# Patient Record
Sex: Male | Born: 1950
Health system: Southern US, Community
[De-identification: ages and names within clinical notes are randomized; demographics above are authoritative.]

## PROBLEM LIST (undated history)

## (undated) ENCOUNTER — Emergency Department (HOSPITAL_COMMUNITY): Admission: EM | Payer: Self-pay | Source: Home / Self Care

## (undated) DIAGNOSIS — T7840XA Allergy, unspecified, initial encounter: Secondary | ICD-10-CM

## (undated) DIAGNOSIS — F22 Delusional disorders: Secondary | ICD-10-CM

## (undated) DIAGNOSIS — N189 Chronic kidney disease, unspecified: Secondary | ICD-10-CM

## (undated) DIAGNOSIS — E1165 Type 2 diabetes mellitus with hyperglycemia: Secondary | ICD-10-CM

## (undated) HISTORY — DX: Delusional disorders: F22

## (undated) HISTORY — PX: KNEE SURGERY: SHX244

## (undated) HISTORY — PX: TONSILLECTOMY: SUR1361

## (undated) HISTORY — DX: Type 2 diabetes mellitus with hyperglycemia: E11.65

## (undated) HISTORY — DX: Allergy, unspecified, initial encounter: T78.40XA

## (undated) HISTORY — DX: Chronic kidney disease, unspecified: N18.9

---

## 1999-02-24 ENCOUNTER — Emergency Department (HOSPITAL_COMMUNITY): Admission: EM | Admit: 1999-02-24 | Discharge: 1999-02-25 | Payer: Self-pay | Admitting: Emergency Medicine

## 2005-06-22 ENCOUNTER — Ambulatory Visit: Payer: Self-pay | Admitting: Internal Medicine

## 2006-07-30 ENCOUNTER — Encounter: Admission: RE | Admit: 2006-07-30 | Discharge: 2006-07-30 | Payer: Self-pay | Admitting: Orthopedic Surgery

## 2011-08-20 LAB — HM COLONOSCOPY

## 2013-02-03 ENCOUNTER — Ambulatory Visit: Payer: Medicare Other

## 2013-02-03 ENCOUNTER — Ambulatory Visit (INDEPENDENT_AMBULATORY_CARE_PROVIDER_SITE_OTHER): Payer: Medicare Other | Admitting: Emergency Medicine

## 2013-02-03 VITALS — BP 125/84 | HR 82 | Temp 98.0°F | Resp 16 | Ht 73.0 in | Wt 284.0 lb

## 2013-02-03 DIAGNOSIS — M25562 Pain in left knee: Secondary | ICD-10-CM

## 2013-02-03 DIAGNOSIS — M109 Gout, unspecified: Secondary | ICD-10-CM

## 2013-02-03 DIAGNOSIS — M729 Fibroblastic disorder, unspecified: Secondary | ICD-10-CM

## 2013-02-03 LAB — POCT CBC
HCT, POC: 40.1 % — AB (ref 43.5–53.7)
MCH, POC: 30.3 pg (ref 27–31.2)
MCHC: 31.4 g/dL — AB (ref 31.8–35.4)
MPV: 7.6 fL (ref 0–99.8)
POC MID %: 10.3 %M (ref 0–12)
RBC: 4.16 M/uL — AB (ref 4.69–6.13)
WBC: 7 10*3/uL (ref 4.6–10.2)

## 2013-02-03 MED ORDER — COLCHICINE 0.6 MG PO TABS: ORAL_TABLET | ORAL | Status: DC

## 2013-02-03 MED ORDER — INDOMETHACIN 25 MG PO CAPS: 25.0000 mg | ORAL_CAPSULE | Freq: Two times a day (BID) | ORAL | Status: DC

## 2013-02-03 NOTE — Patient Instructions (Addendum)
Gout Gout Gout is an inflammatory condition (arthritis) caused by a buildup of uric acid crystals in the joints. Uric acid is a chemical that is normally present in the blood. Under some circumstances, uric acid can form into crystals in your joints. This causes joint redness, soreness, and swelling (inflammation). Repeat attacks are common. Over time, uric acid crystals can form into masses (tophi) near a joint, causing disfigurement. Gout is treatable and often preventable. CAUSES  The disease begins with elevated levels of uric acid in the blood. Uric acid is produced by your body when it breaks down a naturally found substance called purines. This also happens when you eat certain foods such as meats and fish. Causes of an elevated uric acid level include:  Being passed down from parent to child (heredity).  Diseases that cause increased uric acid production (obesity, psoriasis, some cancers).  Excessive alcohol use.  Diet, especially diets rich in meat and seafood.  Medicines, including certain cancer-fighting drugs (chemotherapy), diuretics, and aspirin.  Chronic kidney disease. The kidneys are no longer able to remove uric acid well.  Problems with metabolism. Conditions strongly associated with gout include:  Obesity.  High blood pressure.  High cholesterol.  Diabetes. Not everyone with elevated uric acid levels gets gout. It is not understood why some people get gout and others do not. Surgery, joint injury, and eating too much of certain foods are some of the factors that can lead to gout. SYMPTOMS   An attack of gout comes on quickly. It causes intense pain with redness, swelling, and warmth in a joint.  Fever can occur.  Often, only one joint is involved. Certain joints are more commonly involved:  Base of the big toe.  Knee.  Ankle.  Wrist.  Finger. Without treatment, an attack usually goes away in a few days to weeks. Between attacks, you usually will not  have symptoms, which is different from many other forms of arthritis. DIAGNOSIS  Your caregiver will suspect gout based on your symptoms and exam. Removal of fluid from the joint (arthrocentesis) is done to check for uric acid crystals. Your caregiver will give you a medicine that numbs the area (local anesthetic) and use a needle to remove joint fluid for exam. Gout is confirmed when uric acid crystals are seen in joint fluid, using a special microscope. Sometimes, blood, urine, and X-ray tests are also used. TREATMENT  There are 2 phases to gout treatment: treating the sudden onset (acute) attack and preventing attacks (prophylaxis). Treatment of an Acute Attack  Medicines are used. These include anti-inflammatory medicines or steroid medicines.  An injection of steroid medicine into the affected joint is sometimes necessary.  The painful joint is rested. Movement can worsen the arthritis.  You may use warm or cold treatments on painful joints, depending which works best for you.  Discuss the use of coffee, vitamin C, or cherries with your caregiver. These may be helpful treatment options. Treatment to Prevent Attacks After the acute attack subsides, your caregiver may advise prophylactic medicine. These medicines either help your kidneys eliminate uric acid from your body or decrease your uric acid production. You may need to stay on these medicines for a very long time. The early phase of treatment with prophylactic medicine can be associated with an increase in acute gout attacks. For this reason, during the first few months of treatment, your caregiver may also advise you to take medicines usually used for acute gout treatment. Be sure you understand your caregiver's  directions. You should also discuss dietary treatment with your caregiver. Certain foods such as meats and fish can increase uric acid levels. Other foods such as dairy can decrease levels. Your caregiver can give you a list of  foods to avoid. HOME CARE INSTRUCTIONS   Do not take aspirin to relieve pain. This raises uric acid levels.  Only take over-the-counter or prescription medicines for pain, discomfort, or fever as directed by your caregiver.  Rest the joint as much as possible. When in bed, keep sheets and blankets off painful areas.  Keep the affected joint raised (elevated).  Use crutches if the painful joint is in your leg.  Drink enough water and fluids to keep your urine clear or pale yellow. This helps your body get rid of uric acid. Do not drink alcoholic beverages. They slow the passage of uric acid.  Follow your caregiver's dietary instructions. Pay careful attention to the amount of protein you eat. Your daily diet should emphasize fruits, vegetables, whole grains, and fat-free or low-fat milk products.  Maintain a healthy body weight. SEEK MEDICAL CARE IF:   You have an oral temperature above 102 F (38.9 C).  You develop diarrhea, vomiting, or any side effects from medicines.  You do not feel better in 24 hours, or you are getting worse. SEEK IMMEDIATE MEDICAL CARE IF:   Your joint becomes suddenly more tender and you have:  Chills.  An oral temperature above 102 F (38.9 C), not controlled by medicine. MAKE SURE YOU:   Understand these instructions.  Will watch your condition.  Will get help right away if you are not doing well or get worse. Document Released: 08/02/2000 Document Revised: 10/28/2011 Document Reviewed: 11/13/2009 [facility] Patient Information 2014 [place], [place].

## 2013-02-03 NOTE — Progress Notes (Signed)
[facility] [address], [place] [address] [phone] [phone]  Date:  02/03/2013   Name:  [name]   DOB:  August 29, 1950   MRN:  [id number]  PCP:  Lorenda Peck, MD    Chief Complaint: Knee Injury   History of Present Illness:  [name] is a 62 y.o. very pleasant male patient who presents with the following:  Moving a television and injured his knee is his belief.  No definite injury by history.  Has marked swelling and pain, redness and guarding.  Won't bend his knee due to pain.  No improvement with over the counter medications or other home remedies. Denies other complaint or health concern today.   There are no active problems to display for this patient.   Past Medical History  Diagnosis Date  . Allergy   . Chronic kidney disease     Past Surgical History  Procedure Laterality Date  . Tonsillectomy Bilateral   . Knee surgery Left     History  Substance Use Topics  . Smoking status: Never Smoker   . Smokeless tobacco: Not on file  . Alcohol Use: No    History reviewed. No pertinent family history.  Allergies  Allergen Reactions  . Achromycin (Tetracycline) Rash    Medication list has been reviewed and updated.  No current outpatient prescriptions on file prior to visit.   No current facility-administered medications on file prior to visit.    Review of Systems:  As per HPI, otherwise negative.    Physical Examination: Filed Vitals:   02/03/13 1757  BP: 125/84  Pulse: 82  Temp: 98 F (36.7 C)  Resp: 16   Filed Vitals:   02/03/13 1757  Height: 6\' 1"  (1.854 m)  Weight: 284 lb (128.822 kg)   Body mass index is 37.48 kg/(m^2). Ideal Body Weight: Weight in (lb) to have BMI = 25: 189.1   GEN: WDWN, NAD, Non-toxic, Alert & Oriented x 3 HEENT: Atraumatic, Normocephalic.  Ears and Nose: No external deformity. EXTR: No clubbing/cyanosis/edema NEURO: Normal gait.  PSYCH: Normally interactive. Conversant.  Not depressed or anxious appearing.  Calm demeanor.  LEFT Knee:  Moderate effusion.  Limited flexion.  Hot.    Assessment and Plan: Gout Labs pending Indocin colcrys Crutches WBAT  Signed,  Phillips Odor, MD   [facility] reading (PRIMARY) by  Dr. Dareen Piano.  Staple, degenerative arthritis .  Results for orders placed in visit on 02/03/13  POCT CBC      Result Value Range   WBC 7.0  4.6 - 10.2 K/uL   Lymph, poc 1.7  0.6 - 3.4   POC LYMPH PERCENT 23.8  10 - 50 %L   MID (cbc) 0.7  0 - 0.9   POC MID % 10.3  0 - 12 %M   POC Granulocyte 4.6  2 - 6.9   Granulocyte percent 65.9  37 - 80 %G   RBC 4.16 (*) 4.69 - 6.13 M/uL   Hemoglobin 12.6 (*) 14.1 - 18.1 g/dL   HCT, POC 29.5 (*) 62.1 - 53.7 %   MCV 96.5  80 - 97 fL   MCH, POC 30.3  27 - 31.2 pg   MCHC 31.4 (*) 31.8 - 35.4 g/dL   RDW, POC 30.8     Platelet Count, POC 218  142 - 424 K/uL   MPV 7.6  0 - 99.8 fL

## 2014-08-30 DIAGNOSIS — C44319 Basal cell carcinoma of skin of other parts of face: Secondary | ICD-10-CM | POA: Diagnosis not present

## 2014-10-24 DIAGNOSIS — E1165 Type 2 diabetes mellitus with hyperglycemia: Secondary | ICD-10-CM | POA: Diagnosis not present

## 2014-10-25 DIAGNOSIS — E1165 Type 2 diabetes mellitus with hyperglycemia: Secondary | ICD-10-CM | POA: Diagnosis not present

## 2014-12-27 DIAGNOSIS — L570 Actinic keratosis: Secondary | ICD-10-CM | POA: Diagnosis not present

## 2014-12-27 DIAGNOSIS — Z85828 Personal history of other malignant neoplasm of skin: Secondary | ICD-10-CM | POA: Diagnosis not present

## 2014-12-27 DIAGNOSIS — L919 Hypertrophic disorder of the skin, unspecified: Secondary | ICD-10-CM | POA: Diagnosis not present

## 2014-12-27 DIAGNOSIS — L814 Other melanin hyperpigmentation: Secondary | ICD-10-CM | POA: Diagnosis not present

## 2015-01-23 DIAGNOSIS — H9113 Presbycusis, bilateral: Secondary | ICD-10-CM | POA: Diagnosis not present

## 2015-01-23 DIAGNOSIS — R002 Palpitations: Secondary | ICD-10-CM | POA: Diagnosis not present

## 2015-01-23 DIAGNOSIS — Z1211 Encounter for screening for malignant neoplasm of colon: Secondary | ICD-10-CM | POA: Diagnosis not present

## 2015-01-23 DIAGNOSIS — R1032 Left lower quadrant pain: Secondary | ICD-10-CM | POA: Diagnosis not present

## 2015-01-23 DIAGNOSIS — E1165 Type 2 diabetes mellitus with hyperglycemia: Secondary | ICD-10-CM | POA: Diagnosis not present

## 2015-01-23 DIAGNOSIS — Z0000 Encounter for general adult medical examination without abnormal findings: Secondary | ICD-10-CM | POA: Diagnosis not present

## 2015-01-23 DIAGNOSIS — Z6836 Body mass index (BMI) 36.0-36.9, adult: Secondary | ICD-10-CM | POA: Diagnosis not present

## 2015-01-23 DIAGNOSIS — E784 Other hyperlipidemia: Secondary | ICD-10-CM | POA: Diagnosis not present

## 2015-01-23 DIAGNOSIS — I70219 Atherosclerosis of native arteries of extremities with intermittent claudication, unspecified extremity: Secondary | ICD-10-CM | POA: Diagnosis not present

## 2015-01-23 DIAGNOSIS — R42 Dizziness and giddiness: Secondary | ICD-10-CM | POA: Diagnosis not present

## 2015-01-23 DIAGNOSIS — Z1389 Encounter for screening for other disorder: Secondary | ICD-10-CM | POA: Diagnosis not present

## 2015-01-27 DIAGNOSIS — Z79899 Other long term (current) drug therapy: Secondary | ICD-10-CM | POA: Diagnosis not present

## 2015-01-27 DIAGNOSIS — E1165 Type 2 diabetes mellitus with hyperglycemia: Secondary | ICD-10-CM | POA: Diagnosis not present

## 2015-01-27 DIAGNOSIS — R5383 Other fatigue: Secondary | ICD-10-CM | POA: Diagnosis not present

## 2015-01-27 DIAGNOSIS — Z125 Encounter for screening for malignant neoplasm of prostate: Secondary | ICD-10-CM | POA: Diagnosis not present

## 2015-01-27 DIAGNOSIS — E039 Hypothyroidism, unspecified: Secondary | ICD-10-CM | POA: Diagnosis not present

## 2015-01-27 DIAGNOSIS — E780 Pure hypercholesterolemia: Secondary | ICD-10-CM | POA: Diagnosis not present

## 2015-06-01 DIAGNOSIS — Z23 Encounter for immunization: Secondary | ICD-10-CM | POA: Diagnosis not present

## 2015-07-03 DIAGNOSIS — Z85828 Personal history of other malignant neoplasm of skin: Secondary | ICD-10-CM | POA: Diagnosis not present

## 2015-07-03 DIAGNOSIS — L814 Other melanin hyperpigmentation: Secondary | ICD-10-CM | POA: Diagnosis not present

## 2015-07-03 DIAGNOSIS — L820 Inflamed seborrheic keratosis: Secondary | ICD-10-CM | POA: Diagnosis not present

## 2015-07-03 DIAGNOSIS — L851 Acquired keratosis [keratoderma] palmaris et plantaris: Secondary | ICD-10-CM | POA: Diagnosis not present

## 2016-05-22 DIAGNOSIS — Z23 Encounter for immunization: Secondary | ICD-10-CM | POA: Diagnosis not present

## 2017-02-04 ENCOUNTER — Encounter: Payer: Self-pay | Admitting: Behavioral Health

## 2017-02-04 ENCOUNTER — Telehealth: Payer: Self-pay | Admitting: Behavioral Health

## 2017-02-04 NOTE — Addendum Note (Signed)
Addended by: Eduard Roux E on: 02/04/2017 12:23 PM   Modules accepted: Orders

## 2017-02-04 NOTE — Telephone Encounter (Signed)
Pre-Visit Call completed with patient and chart updated.   Pre-Visit Info documented in Specialty Comments under [employer].    

## 2017-02-04 NOTE — Telephone Encounter (Signed)
Unable to reach patient at time of Pre-Visit Call.  Left message for patient to return call when available.    

## 2017-02-05 ENCOUNTER — Ambulatory Visit (HOSPITAL_BASED_OUTPATIENT_CLINIC_OR_DEPARTMENT_OTHER)
Admission: RE | Admit: 2017-02-05 | Discharge: 2017-02-05 | Disposition: A | Discharge status: Home / Self Care | Payer: Medicare Other | Source: Ambulatory Visit | Attending: Family Medicine | Admitting: Family Medicine

## 2017-02-05 ENCOUNTER — Ambulatory Visit (INDEPENDENT_AMBULATORY_CARE_PROVIDER_SITE_OTHER): Payer: Medicare Other | Admitting: Family Medicine

## 2017-02-05 ENCOUNTER — Encounter: Payer: Self-pay | Admitting: Family Medicine

## 2017-02-05 VITALS — BP 118/72 | HR 91 | Temp 98.3°F | Ht 73.0 in | Wt 266.5 lb

## 2017-02-05 DIAGNOSIS — G8929 Other chronic pain: Secondary | ICD-10-CM | POA: Insufficient documentation

## 2017-02-05 DIAGNOSIS — M25562 Pain in left knee: Secondary | ICD-10-CM

## 2017-02-05 DIAGNOSIS — M25462 Effusion, left knee: Secondary | ICD-10-CM | POA: Insufficient documentation

## 2017-02-05 DIAGNOSIS — M2342 Loose body in knee, left knee: Secondary | ICD-10-CM | POA: Diagnosis not present
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
## 2017-02-05 NOTE — Progress Notes (Signed)
Pre visit review using our clinic review tool, if applicable. No additional management support is needed unless otherwise documented below in the visit note. 

## 2017-02-05 NOTE — Patient Instructions (Signed)
We will reach out to you if your X-ray is abnormal.   Come fasting to your next appt for 9-12 hours.

## 2017-02-12 ENCOUNTER — Telehealth: Payer: Self-pay | Admitting: Family Medicine

## 2017-02-12 NOTE — Telephone Encounter (Signed)
Pt brought his medical records from his previous primary care physician. Placed chart with supervisor for review.

## 2017-02-13 ENCOUNTER — Telehealth: Payer: Self-pay | Admitting: *Deleted

## 2017-02-13 ENCOUNTER — Telehealth: Payer: Self-pay | Admitting: Family Medicine

## 2017-02-13 NOTE — Telephone Encounter (Signed)
Received Medical records from Dr. Lorene Dy, MD, forwarded to provider/SLS 06/28

## 2017-02-13 NOTE — Telephone Encounter (Signed)
Record review- Synthroid 50 mcg daily Metformin 500 mg daily Amaryl 2 mg daily ASA 81 mg daily Appears he was discharged from previous office, noncompliance was an issue. Last colonoscopy in 04/23/12- diverticulosis, normal otherwise, recommended 5 years (not 10?) follow up for screening purposes.  Last eye exam was 07/13/13, normal. Hx of basal cell carcinoma.

## 2017-03-06 ENCOUNTER — Encounter: Payer: Self-pay | Admitting: Family Medicine

## 2017-03-06 ENCOUNTER — Ambulatory Visit (INDEPENDENT_AMBULATORY_CARE_PROVIDER_SITE_OTHER): Payer: Medicare Other | Admitting: Family Medicine

## 2017-03-06 VITALS — BP 112/78 | HR 74 | Wt 264.8 lb

## 2017-03-06 DIAGNOSIS — Z0000 Encounter for general adult medical examination without abnormal findings: Secondary | ICD-10-CM

## 2017-03-06 DIAGNOSIS — Z8639 Personal history of other endocrine, nutritional and metabolic disease: Secondary | ICD-10-CM

## 2017-03-06 DIAGNOSIS — Z114 Encounter for screening for human immunodeficiency virus [HIV]: Secondary | ICD-10-CM | POA: Diagnosis not present

## 2017-03-06 DIAGNOSIS — Z1159 Encounter for screening for other viral diseases: Secondary | ICD-10-CM

## 2017-03-06 DIAGNOSIS — E785 Hyperlipidemia, unspecified: Secondary | ICD-10-CM | POA: Diagnosis not present

## 2017-03-06 DIAGNOSIS — E1169 Type 2 diabetes mellitus with other specified complication: Secondary | ICD-10-CM

## 2017-03-06 LAB — COMPREHENSIVE METABOLIC PANEL
ALT: 43 U/L (ref 0–53)
AST: 32 U/L (ref 0–37)
Albumin: 4.2 g/dL (ref 3.5–5.2)
Alkaline Phosphatase: 56 U/L (ref 39–117)
BILIRUBIN TOTAL: 0.8 mg/dL (ref 0.2–1.2)
BUN: 11 mg/dL (ref 6–23)
CHLORIDE: 101 meq/L (ref 96–112)
CO2: 29 meq/L (ref 19–32)
Calcium: 9.4 mg/dL (ref 8.4–10.5)
Creatinine, Ser: 0.79 mg/dL (ref 0.40–1.50)
GFR: 104.98 mL/min (ref >60.00)
GLUCOSE: 163 mg/dL — AB (ref 70–99)
Potassium: 4.2 mEq/L (ref 3.5–5.1)
Sodium: 136 mEq/L (ref 135–145)
Total Protein: 6.8 g/dL (ref 6.0–8.3)

## 2017-03-06 LAB — HEPATITIS C ANTIBODY: HCV Ab: NEGATIVE

## 2017-03-06 LAB — LIPID PANEL
CHOL/HDL RATIO: 3
Cholesterol: 133 mg/dL (ref 0–200)
HDL: 39.5 mg/dL (ref >39.00)
LDL CALC: 74 mg/dL (ref 0–99)
NONHDL: 93.68
Triglycerides: 99 mg/dL (ref 0.0–149.0)
VLDL: 19.8 mg/dL (ref 0.0–40.0)

## 2017-03-06 LAB — HEMOGLOBIN A1C: HEMOGLOBIN A1C: 11.4 % — AB (ref 4.6–6.5)

## 2017-03-06 NOTE — Progress Notes (Addendum)
Chief Complaint  Patient presents with  . Annual Exam    Subjective: Pt here for initial Welcome to Medicare Evaluation.  Past Medical History:  Diagnosis Date  . Allergy   . Chronic kidney disease   . Delusional disorder ([place])    Family History  Problem Relation Age of Onset  . Cancer Neg Hx    Past Surgical History:  Procedure Laterality Date  . KNEE SURGERY Left   . TONSILLECTOMY Bilateral    Current Outpatient Prescriptions on File Prior to Visit  Medication Sig Dispense Refill  . Multiple Vitamin (MULTIVITAMIN) tablet Take 1 tablet by mouth daily.    . Oral Electrolytes (BUFFERED SALT PO) Take by mouth.     Allergies  Allergen Reactions  . Achromycin [Tetracycline] Rash     Mental Health/Substance abuse evaluation: Yes  PHQ-2:  Feelings of depression? No  Loss of satisfaction/pleasure in doing things? No  Cognitive evaluation? Yes- alert and oriented  Fall Risk: Less than 2 falls within the past 12 months? Yes  Mindful of grabbing bars in bathroom, ruffles in rugs, poorly lit areas, handrails on the stairs? Yes   Discussion of functional ability done: Encouraged to maintain physical activity and flexibility. Live alone? No - Lives with sister and Jocelyn Lamer- sister in law Need help with the following? Bathing: No  Managing money: No  Taking medications: No  Telephone use: No  Transportation: No  Shopping: No  Preparing meals: No   Hearing/Vision screen: Trouble hearing TV or radio when others do not? No  Straining or struggling to hear/understand conversation? No   Fire safety: Have a working smoke alarm? Yes   Diet Balanced diet? Yes  3 meals daily? Yes  Assistance needed? No   BP 112/78 (BP Location: Left Arm, Patient Position: Sitting, Cuff Size: Large)   Pulse 74   Wt 264 lb 12.8 oz (120.1 kg)   SpO2 95%   BMI 34.94 kg/m   End of life care planning/counseling: Does patient wish to discuss end of life care/planning? No  Does the patient  have an advanced directive? Yes  Patient code status/living will: DNR Forms given? No   Assessment and Plan Well adult exam  Screening for HIV (human immunodeficiency virus)  Need for hepatitis C screening test  History of diabetes mellitus   PCP- Shelda Pal No other specialists or care providers noted.  Colonoscopy- Written plan in AVS for preventative health services. Next colonoscopy 2023.  Tetanus due 08/2023 Influenza vaccine yearly Immunizations UTD- will suggest PCV23 at next visit pending his A1c. F/u pending above. The patient voiced understanding and agreement to the plan.  [place], DO 03/06/17 1:45 PM

## 2017-03-06 NOTE — Patient Instructions (Addendum)
Call your insurance company and ask about coverage for the Shingrix vaccine (new shingles vaccination).  Your next colonoscopy is due in September, 2023.  Follow up will be based on the results of your lab work.   Let us know if you need anything.

## 2017-03-07 ENCOUNTER — Other Ambulatory Visit: Payer: Self-pay | Admitting: Family Medicine

## 2017-03-07 ENCOUNTER — Telehealth: Payer: Self-pay | Admitting: *Deleted

## 2017-03-07 LAB — HIV ANTIBODY (ROUTINE TESTING W REFLEX): HIV 1&2 Ab, 4th Generation: NONREACTIVE

## 2017-03-07 MED ORDER — METFORMIN HCL 500 MG PO TABS: ORAL_TABLET | ORAL | 1 refills | Status: DC

## 2017-03-07 NOTE — Progress Notes (Unsigned)
Metformin sent in.  

## 2017-03-07 NOTE — Telephone Encounter (Signed)
Called and spoke with the pt and the pt's sister and informed them of the pt's recent lab results and note.  They both verbalized understanding and agreed.    Pt was scheduled a follow-up appt for (Wed-03/26/17@ 2:00pm)  New prescription sent to the pharmacy.//AB/CMA

## 2017-03-07 NOTE — Telephone Encounter (Signed)
Rx denied.  New rx sent to the pharmacy.//AB/CMA

## 2017-03-07 NOTE — Telephone Encounter (Signed)
-----   Message from Shelda Pal, DO sent at 03/07/2017  7:16 AM EDT ----- Definitely has diabetes, will restart Metformin and have him follow up in 2-3 weeks. Will rec statin at next visit also.   AB- let pt know his diabetes is not well controlled, I have called in Metformin for him to take, gradual increase should be detailed on bottle. I would like to see him in 2-3 weeks for a visit to discuss next steps with his diabetes. He does not need to be fasting. TY.

## 2017-03-10 NOTE — Telephone Encounter (Signed)
Rx denied.  Pt has already picked up the prescription.//AB/CMA

## 2017-03-26 ENCOUNTER — Ambulatory Visit (INDEPENDENT_AMBULATORY_CARE_PROVIDER_SITE_OTHER): Payer: Medicare Other | Admitting: Family Medicine

## 2017-03-26 ENCOUNTER — Encounter: Payer: Self-pay | Admitting: Family Medicine

## 2017-03-26 ENCOUNTER — Other Ambulatory Visit: Payer: Self-pay | Admitting: Family Medicine

## 2017-03-26 VITALS — BP 151/87 | HR 84 | Ht 73.0 in | Wt 266.8 lb

## 2017-03-26 DIAGNOSIS — IMO0002 Reserved for concepts with insufficient information to code with codable children: Secondary | ICD-10-CM

## 2017-03-26 DIAGNOSIS — E1165 Type 2 diabetes mellitus with hyperglycemia: Secondary | ICD-10-CM

## 2017-03-26 DIAGNOSIS — E1142 Type 2 diabetes mellitus with diabetic polyneuropathy: Secondary | ICD-10-CM | POA: Insufficient documentation

## 2017-03-26 DIAGNOSIS — IMO0001 Reserved for inherently not codable concepts without codable children: Secondary | ICD-10-CM

## 2017-03-26 HISTORY — DX: Type 2 diabetes mellitus with hyperglycemia: E11.65

## 2017-03-26 HISTORY — DX: Reserved for concepts with insufficient information to code with codable children: IMO0002

## 2017-03-26 MED ORDER — PIOGLITAZONE HCL 30 MG PO TABS: 30.0000 mg | ORAL_TABLET | Freq: Every day | ORAL | 1 refills | Status: DC

## 2017-03-26 NOTE — Patient Instructions (Addendum)
Take Metformin twice daily.  Ideally 2 tabs twice daily. This should not affect your liver.  We are calling in another medication to take with your diabetes. Check you sugars and write them down. Bring them to your next appointment.   Aim to do some physical exertion for 150 minutes per week. This is typically divided into 5 days per week, 30 minutes per day. The activity should be enough to get your heart rate up. Anything is better than nothing if you have time constraints.  Let us know if you need anything.

## 2017-03-26 NOTE — Progress Notes (Signed)
Pre visit review using our clinic review tool, if applicable. No additional management support is needed unless otherwise documented below in the visit note. 

## 2017-03-26 NOTE — Progress Notes (Signed)
Chief Complaint  Patient presents with  . Follow-up    pt. here today for diabetes follow-up; no complaints or concerns at this time    Subjective: Patient is a 66 y.o. male here for DM.  Pt had a hx of DM, formerly on Metformin and Glimepiride. He stopped Metformin due to concern for liver issues and the latter because he did not like the way it makes him feel. His glucometer is out of date and he does not routinely check his sugars. He is not routinely physically active. He eats better since hearing the news around 3 weeks ago.    ROS: Heart: Denies chest pain  Lungs: Denies SOB   Family History  Problem Relation Age of Onset  . Cancer Neg Hx    Past Medical History:  Diagnosis Date  . Allergy   . Chronic kidney disease   . Delusional disorder ([place])   . Diabetes type 2, uncontrolled ([place]) 03/26/2017   Allergies  Allergen Reactions  . Achromycin [Tetracycline] Rash    Current Outpatient Prescriptions:  .  metFORMIN (GLUCOPHAGE) 500 MG tablet, Week 1: 1 tab daily Week 2: 1 tab twice daily Week 3: 2 tabs in AM, 1 in evening Week 4: 2 tabs twice daily, Disp: 120 tablet, Rfl: 1 .  Multiple Vitamin (MULTIVITAMIN) tablet, Take 1 tablet by mouth daily., Disp: , Rfl:  .  Oral Electrolytes (BUFFERED SALT PO), Take by mouth., Disp: , Rfl:  .  pioglitazone (ACTOS) 30 MG tablet, Take 1 tablet (30 mg total) by mouth daily., Disp: 30 tablet, Rfl: 1  Objective: BP (!) 151/87 (BP Location: Left Arm, Cuff Size: Large)   Pulse 84   Ht 6\' 1"  (1.854 m)   Wt 266 lb 12.8 oz (121 kg)   SpO2 98%   BMI 35.20 kg/m  General: Awake, appears stated age HEENT: MMM, EOMi Heart: RRR, no murmurs Lungs: CTAB, no rales, wheezes or rhonchi. No accessory muscle use Psych: Age appropriate judgment and insight, normal affect and mood  Assessment and Plan: Uncontrolled type 2 diabetes mellitus without complication, without long-term current use of insulin ([place]) - Plan: pioglitazone (ACTOS) 30 MG  tablet  Orders as above. Pt has not been taking Metformin as instructed, only taking 1 pill/day. Try to get him to take 1 pill twice daily at least. Add Actos.  Counseled on diet and exercise.  F/u in 6 weeks to recheck both BP and sugars. Will also push statin.   The patient voiced understanding and agreement to the plan.  [place], [place] 03/26/17  6:49 PM

## 2017-03-28 NOTE — Telephone Encounter (Signed)
Rx approved and sent to the pharmacy by e-script.//AB/CMA 

## 2017-04-24 DIAGNOSIS — C44629 Squamous cell carcinoma of skin of left upper limb, including shoulder: Secondary | ICD-10-CM | POA: Diagnosis not present

## 2017-04-24 DIAGNOSIS — D485 Neoplasm of uncertain behavior of skin: Secondary | ICD-10-CM | POA: Diagnosis not present

## 2017-06-03 DIAGNOSIS — C44629 Squamous cell carcinoma of skin of left upper limb, including shoulder: Secondary | ICD-10-CM | POA: Diagnosis not present

## 2017-06-03 DIAGNOSIS — L905 Scar conditions and fibrosis of skin: Secondary | ICD-10-CM | POA: Diagnosis not present

## 2017-06-04 DIAGNOSIS — L905 Scar conditions and fibrosis of skin: Secondary | ICD-10-CM | POA: Diagnosis not present

## 2017-06-11 DIAGNOSIS — Z23 Encounter for immunization: Secondary | ICD-10-CM | POA: Diagnosis not present

## 2017-07-07 DIAGNOSIS — M25562 Pain in left knee: Secondary | ICD-10-CM | POA: Diagnosis not present

## 2017-07-07 DIAGNOSIS — Z9889 Other specified postprocedural states: Secondary | ICD-10-CM | POA: Diagnosis not present

## 2017-07-07 DIAGNOSIS — S8990XA Unspecified injury of unspecified lower leg, initial encounter: Secondary | ICD-10-CM | POA: Diagnosis not present

## 2017-07-15 DIAGNOSIS — M1732 Unilateral post-traumatic osteoarthritis, left knee: Secondary | ICD-10-CM | POA: Diagnosis not present

## 2017-09-16 DIAGNOSIS — D225 Melanocytic nevi of trunk: Secondary | ICD-10-CM | POA: Diagnosis not present

## 2017-09-16 DIAGNOSIS — L853 Xerosis cutis: Secondary | ICD-10-CM | POA: Diagnosis not present

## 2017-09-16 DIAGNOSIS — L821 Other seborrheic keratosis: Secondary | ICD-10-CM | POA: Diagnosis not present

## 2017-09-16 DIAGNOSIS — L570 Actinic keratosis: Secondary | ICD-10-CM | POA: Diagnosis not present

## 2017-09-16 DIAGNOSIS — L814 Other melanin hyperpigmentation: Secondary | ICD-10-CM | POA: Diagnosis not present

## 2018-06-19 DIAGNOSIS — H9201 Otalgia, right ear: Secondary | ICD-10-CM | POA: Diagnosis not present

## 2018-06-26 DIAGNOSIS — Z09 Encounter for follow-up examination after completed treatment for conditions other than malignant neoplasm: Secondary | ICD-10-CM | POA: Diagnosis not present

## 2018-06-26 DIAGNOSIS — H9201 Otalgia, right ear: Secondary | ICD-10-CM | POA: Diagnosis not present

## 2018-07-03 DIAGNOSIS — Z0000 Encounter for general adult medical examination without abnormal findings: Secondary | ICD-10-CM | POA: Diagnosis not present

## 2018-07-03 DIAGNOSIS — R0602 Shortness of breath: Secondary | ICD-10-CM | POA: Diagnosis not present

## 2018-07-03 DIAGNOSIS — E1165 Type 2 diabetes mellitus with hyperglycemia: Secondary | ICD-10-CM | POA: Diagnosis not present

## 2018-07-03 DIAGNOSIS — E119 Type 2 diabetes mellitus without complications: Secondary | ICD-10-CM | POA: Diagnosis not present

## 2018-07-03 DIAGNOSIS — E559 Vitamin D deficiency, unspecified: Secondary | ICD-10-CM | POA: Diagnosis not present

## 2018-07-03 DIAGNOSIS — R5383 Other fatigue: Secondary | ICD-10-CM | POA: Diagnosis not present

## 2018-07-03 DIAGNOSIS — E7800 Pure hypercholesterolemia, unspecified: Secondary | ICD-10-CM | POA: Diagnosis not present

## 2018-07-06 DIAGNOSIS — E559 Vitamin D deficiency, unspecified: Secondary | ICD-10-CM | POA: Diagnosis not present

## 2018-07-06 DIAGNOSIS — M6530 Trigger finger, unspecified finger: Secondary | ICD-10-CM | POA: Diagnosis not present

## 2018-07-06 DIAGNOSIS — E1165 Type 2 diabetes mellitus with hyperglycemia: Secondary | ICD-10-CM | POA: Diagnosis not present

## 2018-07-06 DIAGNOSIS — Z0000 Encounter for general adult medical examination without abnormal findings: Secondary | ICD-10-CM | POA: Diagnosis not present

## 2018-07-09 DIAGNOSIS — L84 Corns and callosities: Secondary | ICD-10-CM | POA: Diagnosis not present

## 2018-07-09 DIAGNOSIS — M79672 Pain in left foot: Secondary | ICD-10-CM | POA: Diagnosis not present

## 2018-07-09 DIAGNOSIS — B351 Tinea unguium: Secondary | ICD-10-CM | POA: Diagnosis not present

## 2018-07-09 DIAGNOSIS — E1142 Type 2 diabetes mellitus with diabetic polyneuropathy: Secondary | ICD-10-CM | POA: Diagnosis not present

## 2018-10-06 DIAGNOSIS — E559 Vitamin D deficiency, unspecified: Secondary | ICD-10-CM | POA: Diagnosis not present

## 2018-10-06 DIAGNOSIS — E1142 Type 2 diabetes mellitus with diabetic polyneuropathy: Secondary | ICD-10-CM | POA: Diagnosis not present

## 2018-10-06 DIAGNOSIS — E7800 Pure hypercholesterolemia, unspecified: Secondary | ICD-10-CM | POA: Diagnosis not present

## 2018-10-06 DIAGNOSIS — E1165 Type 2 diabetes mellitus with hyperglycemia: Secondary | ICD-10-CM | POA: Diagnosis not present

## 2018-10-06 DIAGNOSIS — Z9889 Other specified postprocedural states: Secondary | ICD-10-CM | POA: Diagnosis not present

## 2018-10-06 DIAGNOSIS — Z87891 Personal history of nicotine dependence: Secondary | ICD-10-CM | POA: Diagnosis not present

## 2018-10-06 DIAGNOSIS — F17200 Nicotine dependence, unspecified, uncomplicated: Secondary | ICD-10-CM | POA: Diagnosis not present

## 2018-10-13 DIAGNOSIS — Z87891 Personal history of nicotine dependence: Secondary | ICD-10-CM | POA: Diagnosis not present

## 2018-10-13 DIAGNOSIS — F17210 Nicotine dependence, cigarettes, uncomplicated: Secondary | ICD-10-CM | POA: Diagnosis not present

## 2018-10-13 DIAGNOSIS — E559 Vitamin D deficiency, unspecified: Secondary | ICD-10-CM | POA: Diagnosis not present

## 2018-10-13 DIAGNOSIS — E119 Type 2 diabetes mellitus without complications: Secondary | ICD-10-CM | POA: Diagnosis not present

## 2018-10-20 DIAGNOSIS — E1165 Type 2 diabetes mellitus with hyperglycemia: Secondary | ICD-10-CM | POA: Diagnosis not present

## 2018-10-20 DIAGNOSIS — E7800 Pure hypercholesterolemia, unspecified: Secondary | ICD-10-CM | POA: Diagnosis not present

## 2019-09-07 DIAGNOSIS — M25572 Pain in left ankle and joints of left foot: Secondary | ICD-10-CM | POA: Insufficient documentation

## 2019-09-13 ENCOUNTER — Ambulatory Visit: Payer: Medicare HMO | Admitting: Neurology

## 2019-09-13 ENCOUNTER — Other Ambulatory Visit: Payer: Self-pay

## 2019-09-13 ENCOUNTER — Encounter: Payer: Self-pay | Admitting: Neurology

## 2019-09-13 VITALS — BP 141/84 | HR 94 | Temp 98.5°F | Ht 73.5 in | Wt 266.0 lb

## 2019-09-13 DIAGNOSIS — L409 Psoriasis, unspecified: Secondary | ICD-10-CM | POA: Diagnosis not present

## 2019-09-13 DIAGNOSIS — M25649 Stiffness of unspecified hand, not elsewhere classified: Secondary | ICD-10-CM | POA: Diagnosis not present

## 2019-09-13 DIAGNOSIS — E11618 Type 2 diabetes mellitus with other diabetic arthropathy: Secondary | ICD-10-CM

## 2019-09-13 DIAGNOSIS — R200 Anesthesia of skin: Secondary | ICD-10-CM

## 2019-09-13 DIAGNOSIS — R202 Paresthesia of skin: Secondary | ICD-10-CM

## 2019-09-13 NOTE — Patient Instructions (Signed)
Carpal Tunnel Syndrome  Carpal tunnel syndrome is a condition that causes pain in your hand and arm. The carpal tunnel is a narrow area that is on the palm side of your wrist. Repeated wrist motion or certain diseases may cause swelling in the tunnel. This swelling can pinch the main nerve in the wrist (median nerve). What are the causes? This condition may be caused by:  Repeated wrist motions.  Wrist injuries.  Arthritis.  A sac of fluid (cyst) or abnormal growth (tumor) in the carpal tunnel.  Fluid buildup during pregnancy. Sometimes the cause is not known. What increases the risk? The following factors may make you more likely to develop this condition:  Having a job in which you move your wrist in the same way many times. This includes jobs like being a butcher or a cashier.  Being a woman.  Having other health conditions, such as: ? Diabetes. ? Obesity. ? A thyroid gland that is not active enough (hypothyroidism). ? Kidney failure. What are the signs or symptoms? Symptoms of this condition include:  A tingling feeling in your fingers.  Tingling or a loss of feeling (numbness) in your hand.  Pain in your entire arm. This pain may get worse when you bend your wrist and elbow for a long time.  Pain in your wrist that goes up your arm to your shoulder.  Pain that goes down into your palm or fingers.  A weak feeling in your hands. You may find it hard to grab and hold items. You may feel worse at night. How is this diagnosed? This condition is diagnosed with a medical history and physical exam. You may also have tests, such as:  Electromyogram (EMG). This test checks the signals that the nerves send to the muscles.  Nerve conduction study. This test checks how well signals pass through your nerves.  Imaging tests, such as X-rays, ultrasound, and MRI. These tests check for what might be the cause of your condition. How is this treated? This condition may be treated  with:  Lifestyle changes. You will be asked to stop or change the activity that caused your problem.  Doing exercise and activities that make bones and muscles stronger (physical therapy).  Learning how to use your hand again (occupational therapy).  Medicines for pain and swelling (inflammation). You may have injections in your wrist.  A wrist splint.  Surgery. Follow these instructions at home: If you have a splint:  Wear the splint as told by your doctor. Remove it only as told by your doctor.  Loosen the splint if your fingers: ? Tingle. ? Lose feeling (become numb). ? Turn cold and blue.  Keep the splint clean.  If the splint is not waterproof: ? Do not let it get wet. ? Cover it with a watertight covering when you take a bath or a shower. Managing pain, stiffness, and swelling   If told, put ice on the painful area: ? If you have a removable splint, remove it as told by your doctor. ? Put ice in a plastic bag. ? Place a towel between your skin and the bag. ? Leave the ice on for 20 minutes, 2-3 times per day. General instructions  Take over-the-counter and prescription medicines only as told by your doctor.  Rest your wrist from any activity that may cause pain. If needed, talk with your boss at work about changes that can help your wrist heal.  Do any exercises as told by your doctor,   physical therapist, or occupational therapist.  Keep all follow-up visits as told by your doctor. This is important. Contact a doctor if:  You have new symptoms.  Medicine does not help your pain.  Your symptoms get worse. Get help right away if:  You have very bad numbness or tingling in your wrist or hand. Summary  Carpal tunnel syndrome is a condition that causes pain in your hand and arm.  It is often caused by repeated wrist motions.  Lifestyle changes and medicines are used to treat this problem. Surgery may help in very bad cases.  Follow your doctor's  instructions about wearing a splint, resting your wrist, keeping follow-up visits, and calling for help. This information is not intended to replace advice given to you by your health care provider. Make sure you discuss any questions you have with your health care provider. Document Revised: 12/12/2017 Document Reviewed: 12/12/2017 Elsevier Patient Education  2020 Elsevier Inc.  
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
## 2019-10-19 ENCOUNTER — Other Ambulatory Visit: Payer: Self-pay

## 2019-10-19 ENCOUNTER — Ambulatory Visit (INDEPENDENT_AMBULATORY_CARE_PROVIDER_SITE_OTHER): Payer: Medicare HMO | Admitting: Neurology

## 2019-10-19 ENCOUNTER — Ambulatory Visit: Payer: Medicare HMO | Admitting: Neurology

## 2019-10-19 ENCOUNTER — Encounter: Payer: Self-pay | Admitting: Neurology

## 2019-10-19 DIAGNOSIS — G5603 Carpal tunnel syndrome, bilateral upper limbs: Secondary | ICD-10-CM | POA: Diagnosis not present

## 2019-10-19 DIAGNOSIS — M25649 Stiffness of unspecified hand, not elsewhere classified: Secondary | ICD-10-CM

## 2019-10-19 DIAGNOSIS — R200 Anesthesia of skin: Secondary | ICD-10-CM

## 2019-10-19 DIAGNOSIS — L409 Psoriasis, unspecified: Secondary | ICD-10-CM

## 2019-10-19 DIAGNOSIS — E11618 Type 2 diabetes mellitus with other diabetic arthropathy: Secondary | ICD-10-CM

## 2019-10-19 HISTORY — DX: Carpal tunnel syndrome, bilateral upper limbs: G56.03

## 2019-10-19 NOTE — Procedures (Signed)
     HISTORY:  [name] is a 69 year old gentleman with a 2-year history of bilateral hand numbness and stiffness.  He denies any neck pain or pain down the arms on either side.  He is being evaluated for possible neuropathy or a cervical radiculopathy.  NERVE CONDUCTION STUDIES:  Nerve conduction studies were performed on both upper extremities.  The distal motor latencies for the median nerves were prolonged bilaterally with low motor amplitude on the right and normal on the left.  Slowing was seen for these nerves bilaterally.  The distal motor latencies and motor amplitudes for the ulnar nerves were normal bilaterally with normal nerve conduction velocities for these nerves bilaterally.  The sensory latencies for the median nerves were unobtainable bilaterally, normal for the ulnar nerves bilaterally.  The F-wave latencies for the ulnar nerves were slightly prolonged bilaterally.  EMG STUDIES:  EMG study was performed on the left upper extremity:  The first dorsal interosseous muscle reveals 2 to 4 K units with full recruitment. No fibrillations or positive waves were noted. The abductor pollicis brevis muscle reveals 2 to 4 K units with decreased recruitment. No fibrillations or positive waves were noted. The extensor indicis proprius muscle reveals 1 to 3 K units with full recruitment. No fibrillations or positive waves were noted. The pronator teres muscle reveals 2 to 3 K units with full recruitment. No fibrillations or positive waves were noted. The biceps muscle reveals 1 to 2 K units with full recruitment. No fibrillations or positive waves were noted. The triceps muscle reveals 2 to 4 K units with full recruitment. No fibrillations or positive waves were noted. The anterior deltoid muscle reveals 2 to 3 K units with full recruitment. No fibrillations or positive waves were noted. The cervical paraspinal muscles were tested at 2 levels. No abnormalities of insertional activity  were seen at either level tested. There was good relaxation.   IMPRESSION:  Nerve conduction studies done on both upper extremities are notable for bilateral carpal tunnel syndrome of moderate severity bilaterally, slightly worse on the right.  EMG evaluation of the left upper extremity was unremarkable, no evidence of an overlying cervical radiculopathy was seen.  Jill Alexanders MD 10/19/2019 3:29 PM  [facility] [address] [place], [place] [address]  Phone [phone] Fax [phone]

## 2019-10-19 NOTE — Progress Notes (Signed)
Please refer to EMG and nerve conduction procedure note.  

## 2019-10-19 NOTE — Progress Notes (Signed)
IMPRESSION:   Nerve conduction studies done on both upper extremities are  notable for bilateral carpal tunnel syndrome of moderate severity  bilaterally, slightly worse on the right.  EMG evaluation of the  left upper extremity was unremarkable, no evidence of an  overlying cervical radiculopathy was seen.   Treatment recommendation is a wrist split. If not successful, referral to hand surgery. Larey Seat, MD

## 2019-10-19 NOTE — Progress Notes (Signed)
[place]    Nerve / Sites Muscle Latency Ref. Amplitude Ref. Rel Amp Segments Distance Velocity Ref. Area    ms ms mV mV %  cm m/s m/s mVms  R Median - APB     Wrist APB 10.7 ?4.4 1.6 ?4.0 100 Wrist - APB 7   4.5     Upper arm APB 16.8  1.1  70.1 Upper arm - Wrist 25 41 ?49 3.4  L Median - APB     Wrist APB 9.1 ?4.4 4.2 ?4.0 100 Wrist - APB 7   14.9     Upper arm APB 15.2  2.1  50.3 Upper arm - Wrist 24 39 ?49 11.1  R Ulnar - ADM     Wrist ADM 2.9 ?3.3 9.5 ?6.0 100 Wrist - ADM 7   31.2     B.Elbow ADM 7.6  8.7  91.6 B.Elbow - Wrist 23 49 ?49 27.3     A.Elbow ADM 9.6  8.6  99.6 A.Elbow - B.Elbow 10 49 ?49 28.5         A.Elbow - Wrist      L Ulnar - ADM     Wrist ADM 3.1 ?3.3 11.0 ?6.0 100 Wrist - ADM 7   32.0     B.Elbow ADM 7.6  10.1  91.6 B.Elbow - Wrist 22 49 ?49 29.7     A.Elbow ADM 9.6  9.9  97.8 A.Elbow - B.Elbow 10 49 ?49 28.8         A.Elbow - Wrist                 SNC    Nerve / Sites Rec. Site Peak Lat Ref.  Amp Ref. Segments Distance    ms ms V V  cm  R Median - Orthodromic (Dig II, Mid palm)     Dig II Wrist NR ?3.4 NR ?10 Dig II - Wrist 13  L Median - Orthodromic (Dig II, Mid palm)     Dig II Wrist NR ?3.4 NR ?10 Dig II - Wrist 13  R Ulnar - Orthodromic, (Dig V, Mid palm)     Dig V Wrist 3.1 ?3.1 5 ?5 Dig V - Wrist 11  L Ulnar - Orthodromic, (Dig V, Mid palm)     Dig V Wrist 3.0 ?3.1 5 ?5 Dig V - Wrist 66             F  Wave    Nerve F Lat Ref.   ms ms  R Ulnar - ADM 34.0 ?32.0  L Ulnar - ADM 35.2 ?32.0

## 2019-10-21 ENCOUNTER — Telehealth: Payer: Self-pay | Admitting: Neurology

## 2019-10-21 NOTE — Telephone Encounter (Signed)
Called the patient and reviewed in detail NCV/EMG studies. Advised the patient that he had moderate degree of bilateral carpal tunnel. Advised that the right was worse then left. The patient was advised to start wearing wrist splints for both wrist. Advised that if after using them he finds that it is not providing relief the next step would be referral to hand surgeon. Scheduled the patient for April 8 for follow up visit with Dr Brett Fairy.

## 2019-10-21 NOTE — Telephone Encounter (Signed)
-----   Message from Larey Seat, MD sent at 10/19/2019  5:29 PM EST ----- IMPRESSION:   Nerve conduction studies done on both upper extremities are  notable for bilateral carpal tunnel syndrome of moderate severity  bilaterally, slightly worse on the right.  EMG evaluation of the  left upper extremity was unremarkable, no evidence of an  overlying cervical radiculopathy was seen.   Treatment recommendation is a wrist split. If not successful, referral to hand surgery. Larey Seat, MD

## 2019-11-25 ENCOUNTER — Ambulatory Visit: Payer: Self-pay | Admitting: Neurology

## 2020-01-09 ENCOUNTER — Inpatient Hospital Stay (HOSPITAL_COMMUNITY)
Admission: EM | Admit: 2020-01-09 | Discharge: 2020-01-12 | DRG: 247 | Disposition: A | Discharge status: Home / Self Care | Payer: Medicare HMO | Attending: Cardiology | Admitting: Cardiology

## 2020-01-09 DIAGNOSIS — Z79899 Other long term (current) drug therapy: Secondary | ICD-10-CM

## 2020-01-09 DIAGNOSIS — I472 Ventricular tachycardia: Secondary | ICD-10-CM | POA: Diagnosis present

## 2020-01-09 DIAGNOSIS — E1142 Type 2 diabetes mellitus with diabetic polyneuropathy: Secondary | ICD-10-CM | POA: Diagnosis present

## 2020-01-09 DIAGNOSIS — I2102 ST elevation (STEMI) myocardial infarction involving left anterior descending coronary artery: Secondary | ICD-10-CM | POA: Diagnosis not present

## 2020-01-09 DIAGNOSIS — I5042 Chronic combined systolic (congestive) and diastolic (congestive) heart failure: Secondary | ICD-10-CM | POA: Diagnosis present

## 2020-01-09 DIAGNOSIS — E1165 Type 2 diabetes mellitus with hyperglycemia: Secondary | ICD-10-CM | POA: Diagnosis present

## 2020-01-09 DIAGNOSIS — E7800 Pure hypercholesterolemia, unspecified: Secondary | ICD-10-CM | POA: Diagnosis present

## 2020-01-09 DIAGNOSIS — Z7984 Long term (current) use of oral hypoglycemic drugs: Secondary | ICD-10-CM

## 2020-01-09 DIAGNOSIS — IMO0002 Reserved for concepts with insufficient information to code with codable children: Secondary | ICD-10-CM | POA: Diagnosis present

## 2020-01-09 DIAGNOSIS — Z20822 Contact with and (suspected) exposure to covid-19: Secondary | ICD-10-CM | POA: Diagnosis present

## 2020-01-09 DIAGNOSIS — I2510 Atherosclerotic heart disease of native coronary artery without angina pectoris: Secondary | ICD-10-CM | POA: Diagnosis present

## 2020-01-09 DIAGNOSIS — I493 Ventricular premature depolarization: Secondary | ICD-10-CM | POA: Diagnosis present

## 2020-01-09 DIAGNOSIS — E785 Hyperlipidemia, unspecified: Secondary | ICD-10-CM | POA: Diagnosis present

## 2020-01-09 DIAGNOSIS — I110 Hypertensive heart disease with heart failure: Secondary | ICD-10-CM | POA: Diagnosis present

## 2020-01-09 DIAGNOSIS — Z955 Presence of coronary angioplasty implant and graft: Secondary | ICD-10-CM

## 2020-01-09 DIAGNOSIS — I10 Essential (primary) hypertension: Secondary | ICD-10-CM | POA: Diagnosis present

## 2020-01-09 MED ORDER — HEPARIN SODIUM (PORCINE) 5000 UNIT/ML IJ SOLN: 4000.0000 [IU] | Freq: Once | INTRAMUSCULAR | Status: DC

## 2020-01-09 MED ORDER — NITROGLYCERIN IN D5W 200-5 MCG/ML-% IV SOLN: 0.0000 ug/min | INTRAVENOUS | Status: DC

## 2020-01-09 MED ORDER — SODIUM CHLORIDE 0.9 % IV SOLN: INTRAVENOUS | Status: DC

## 2020-01-09 NOTE — ED Provider Notes (Signed)
[facility] Provider Note  CSN: [id number] Arrival date & time:    Chief Complaint(s) Code STEMI  HPI [name] is a 69 y.o. male with a history of diabetes who presents to the emergency department with approximately 1 hour of substernal chest pressure brought in as a code STEMI by EMS.  Pain is nonradiating nonexertional.  Is associated with nausea and emesis.  No shortness of breath.  Patient reports that the pain started 1 hour after eating a microwave TV dinner.  He believes it is indigestion but it was so severe it prompted him to call EMS.  Pain has improved with 2 doses of nitroglycerin.  Patient has received 324 of aspirin by EMS.  The history is provided by the patient.    Past Medical History Past Medical History:  Diagnosis Date  . Allergy   . Bilateral carpal tunnel syndrome 10/19/2019  . Chronic kidney disease   . Delusional disorder ([place])   . Diabetes type 2, uncontrolled ([place]) 03/26/2017   Patient Active Problem List   Diagnosis Date Noted  . STEMI (ST elevation myocardial infarction) ([place]) 01/10/2020  . Bilateral carpal tunnel syndrome 10/19/2019  . Diabetes type 2, uncontrolled ([place]) 03/26/2017   Home Medication(s) Prior to Admission medications   Medication Sig Start Date End Date Taking? Authorizing Provider  glimepiride (AMARYL) 2 MG tablet  06/26/19   [provider]  ibuprofen (ADVIL) 200 MG tablet Take 200 mg by mouth every 6 (six) hours as needed for mild pain (inflammation).    [provider]  Multiple Vitamin (MULTIVITAMIN) tablet Take 1 tablet by mouth daily.    [provider]  Oral Electrolytes (BUFFERED SALT PO) Take by mouth.    [provider]  VITAMIN E PO Take 1 tablet by mouth daily.    [provider]                                                                                                                                    Past Surgical History Past  Surgical History:  Procedure Laterality Date  . KNEE SURGERY Left   . TONSILLECTOMY Bilateral    Family History Family History  Problem Relation Age of Onset  . Cancer Neg Hx     Social History Social History   Tobacco Use  . Smoking status: Never Smoker  . Smokeless tobacco: Never Used  Substance Use Topics  . Alcohol use: No  . Drug use: No   Allergies Achromycin [tetracycline]  Review of Systems Review of Systems All other systems are reviewed and are negative for acute change except as noted in the HPI  Physical Exam Vital Signs  I have reviewed the triage vital signs BP (!) 184/135 (BP Location: Right Arm)   Pulse (!) 101   Temp 98.1 F (36.7 C) (Oral)   SpO2 95%   Physical Exam Vitals reviewed.  Constitutional:      General: He is not in acute distress.    Appearance: He is well-developed. He is not diaphoretic.  HENT:     Head: Normocephalic and atraumatic.     Nose: Nose normal.  Eyes:     General: No scleral icterus.       Right eye: No discharge.        Left eye: No discharge.     Conjunctiva/sclera: Conjunctivae normal.     Pupils: Pupils are equal, round, and reactive to light.  Cardiovascular:     Rate and Rhythm: Normal rate and regular rhythm.     Heart sounds: No murmur. No friction rub. No gallop.   Pulmonary:     Effort: Pulmonary effort is normal. No respiratory distress.     Breath sounds: Normal breath sounds. No stridor. No rales.  Abdominal:     General: There is no distension.     Palpations: Abdomen is soft.     Tenderness: There is no abdominal tenderness.  Musculoskeletal:        General: No tenderness.     Cervical back: Normal range of motion and neck supple.  Skin:    General: Skin is warm and dry.     Findings: No erythema or rash.  Neurological:     Mental Status: He is alert and oriented to person, place, and time.     ED Results and Treatments Labs (all labs ordered are listed, but only abnormal results are  displayed) Labs Reviewed  SARS CORONAVIRUS 2 BY RT PCR ([facility])  HEMOGLOBIN A1C  CBC WITH DIFFERENTIAL/PLATELET  PROTIME-INR  APTT  COMPREHENSIVE METABOLIC PANEL  LIPID PANEL  TROPONIN I (HIGH SENSITIVITY)                                                                                                                         EKG    Radiology No results found.  Pertinent labs & imaging results that were available during my care of the patient were reviewed by me and considered in my medical decision making (see chart for details).  Medications Ordered in ED Medications  0.9 %  sodium chloride infusion (has no administration in time range)  heparin injection 4,000 Units ( Intravenous MAR Hold 01/10/20 0008)  nitroGLYCERIN 50 mg in dextrose 5 % 250 mL (0.2 mg/mL) infusion ( Intravenous MAR Hold 01/10/20 0008)  lidocaine (PF) (XYLOCAINE) 1 % injection (2 mLs Other Given 01/10/20 0020)  Radial Cocktail/Verapamil only (10 mLs Intra-arterial Given 01/10/20 0021)  ondansetron (ZOFRAN) injection (4 mg Intravenous Given 01/10/20 0022)  0.9 %  sodium chloride infusion (10 mL/hr Intravenous New Bag/Given 01/10/20 0020)  Procedures .Critical Care Performed by: Fatima Blank, MD Authorized by: Fatima Blank, MD    CRITICAL CARE Performed by: Grayce Sessions Tylisa Alcivar Total critical care time: 30 minutes Critical care time was exclusive of separately billable procedures and treating other patients. Critical care was necessary to treat or prevent imminent or life-threatening deterioration. Critical care was time spent personally by me on the following activities: development of treatment plan with patient and/or surrogate as well as nursing, discussions with consultants, evaluation of patient's response to  treatment, examination of patient, obtaining history from patient or surrogate, ordering and performing treatments and interventions, ordering and review of laboratory studies, ordering and review of radiographic studies, pulse oximetry and re-evaluation of patient's condition.    (including critical care time)  Medical Decision Making / ED Course I have reviewed the nursing notes for this encounter and the patient's prior records (if available in EHR or on provided paperwork).   [name] was evaluated in Emergency Department on 01/10/2020 for the symptoms described in the history of present illness. He was evaluated in the context of the global COVID-19 pandemic, which necessitated consideration that the patient might be at risk for infection with the SARS-CoV-2 virus that causes COVID-19. Institutional protocols and algorithms that pertain to the evaluation of patients at risk for COVID-19 are in a state of rapid change based on information released by regulatory bodies including the CDC and federal and state organizations. These policies and algorithms were followed during the patient's care in the ED.  CODE STEMI. EKG concerning for LAD lesion. HDS  STEMI ordered placed. Spoke with Dr. [name] who is ready for patient in the cath lab. Patient transported up.      Final Clinical Impression(s) / ED Diagnoses Final diagnoses:  Acute ST elevation myocardial infarction (STEMI) involving left anterior descending (LAD) coronary artery ([place])      This chart was dictated using voice recognition software.  Despite best efforts to proofread,  errors can occur which can change the documentation meaning.   Fatima Blank, MD 01/10/20 [phone]

## 2020-01-10 ENCOUNTER — Encounter (HOSPITAL_COMMUNITY): Payer: Self-pay | Admitting: Cardiology

## 2020-01-10 ENCOUNTER — Inpatient Hospital Stay (HOSPITAL_COMMUNITY): Payer: Medicare HMO

## 2020-01-10 ENCOUNTER — Other Ambulatory Visit: Payer: Self-pay

## 2020-01-10 ENCOUNTER — Emergency Department (HOSPITAL_COMMUNITY): Payer: Medicare HMO

## 2020-01-10 ENCOUNTER — Encounter (HOSPITAL_COMMUNITY)
Admission: EM | Disposition: A | Discharge status: Home / Self Care | Payer: Self-pay | Source: Home / Self Care | Attending: Cardiology

## 2020-01-10 DIAGNOSIS — I213 ST elevation (STEMI) myocardial infarction of unspecified site: Secondary | ICD-10-CM | POA: Insufficient documentation

## 2020-01-10 DIAGNOSIS — I2102 ST elevation (STEMI) myocardial infarction involving left anterior descending coronary artery: Secondary | ICD-10-CM | POA: Diagnosis present

## 2020-01-10 DIAGNOSIS — I110 Hypertensive heart disease with heart failure: Secondary | ICD-10-CM | POA: Diagnosis present

## 2020-01-10 DIAGNOSIS — E7800 Pure hypercholesterolemia, unspecified: Secondary | ICD-10-CM | POA: Diagnosis present

## 2020-01-10 DIAGNOSIS — I472 Ventricular tachycardia: Secondary | ICD-10-CM | POA: Diagnosis present

## 2020-01-10 DIAGNOSIS — Z7984 Long term (current) use of oral hypoglycemic drugs: Secondary | ICD-10-CM | POA: Diagnosis not present

## 2020-01-10 DIAGNOSIS — Z79899 Other long term (current) drug therapy: Secondary | ICD-10-CM | POA: Diagnosis not present

## 2020-01-10 DIAGNOSIS — I10 Essential (primary) hypertension: Secondary | ICD-10-CM | POA: Diagnosis present

## 2020-01-10 DIAGNOSIS — I493 Ventricular premature depolarization: Secondary | ICD-10-CM | POA: Diagnosis present

## 2020-01-10 DIAGNOSIS — I2510 Atherosclerotic heart disease of native coronary artery without angina pectoris: Secondary | ICD-10-CM | POA: Diagnosis present

## 2020-01-10 DIAGNOSIS — I5042 Chronic combined systolic (congestive) and diastolic (congestive) heart failure: Secondary | ICD-10-CM | POA: Diagnosis present

## 2020-01-10 DIAGNOSIS — Z20822 Contact with and (suspected) exposure to covid-19: Secondary | ICD-10-CM | POA: Diagnosis present

## 2020-01-10 DIAGNOSIS — E785 Hyperlipidemia, unspecified: Secondary | ICD-10-CM | POA: Diagnosis present

## 2020-01-10 DIAGNOSIS — E1165 Type 2 diabetes mellitus with hyperglycemia: Secondary | ICD-10-CM | POA: Diagnosis present

## 2020-01-10 HISTORY — PX: CORONARY STENT INTERVENTION: CATH118234

## 2020-01-10 HISTORY — PX: LEFT HEART CATH AND CORONARY ANGIOGRAPHY: CATH118249

## 2020-01-10 HISTORY — PX: CORONARY/GRAFT ACUTE MI REVASCULARIZATION: CATH118305

## 2020-01-10 LAB — BASIC METABOLIC PANEL
Anion gap: 17 — ABNORMAL HIGH (ref 5–15)
BUN: 14 mg/dL (ref 8–23)
CO2: 18 mmol/L — ABNORMAL LOW (ref 22–32)
Calcium: 10.3 mg/dL (ref 8.9–10.3)
Chloride: 99 mmol/L (ref 98–111)
Creatinine, Ser: 0.87 mg/dL (ref 0.61–1.24)
GFR calc Af Amer: >60 mL/min (ref >60)
GFR calc non Af Amer: >60 mL/min (ref >60)
Glucose, Bld: 350 mg/dL — ABNORMAL HIGH (ref 70–99)
Potassium: 3.4 mmol/L — ABNORMAL LOW (ref 3.5–5.1)
Sodium: 134 mmol/L — ABNORMAL LOW (ref 135–145)

## 2020-01-10 LAB — CBC
HCT: 42.8 % (ref 39.0–52.0)
Hemoglobin: 15.3 g/dL (ref 13.0–17.0)
MCH: 30.9 pg (ref 26.0–34.0)
MCHC: 35.7 g/dL (ref 30.0–36.0)
MCV: 86.5 fL (ref 80.0–100.0)
Platelets: 260 10*3/uL (ref 150–400)
RBC: 4.95 MIL/uL (ref 4.22–5.81)
RDW: 11.9 % (ref 11.5–15.5)
WBC: 13.3 10*3/uL — ABNORMAL HIGH (ref 4.0–10.5)
nRBC: 0 % (ref 0.0–0.2)

## 2020-01-10 LAB — COMPREHENSIVE METABOLIC PANEL
ALT: 35 U/L (ref 0–44)
AST: 41 U/L (ref 15–41)
Albumin: 4 g/dL (ref 3.5–5.0)
Alkaline Phosphatase: 61 U/L (ref 38–126)
Anion gap: 17 — ABNORMAL HIGH (ref 5–15)
BUN: 14 mg/dL (ref 8–23)
CO2: 21 mmol/L — ABNORMAL LOW (ref 22–32)
Calcium: 10.1 mg/dL (ref 8.9–10.3)
Chloride: 97 mmol/L — ABNORMAL LOW (ref 98–111)
Creatinine, Ser: 0.82 mg/dL (ref 0.61–1.24)
GFR calc Af Amer: >60 mL/min (ref >60)
GFR calc non Af Amer: >60 mL/min (ref >60)
Glucose, Bld: 309 mg/dL — ABNORMAL HIGH (ref 70–99)
Potassium: 3.4 mmol/L — ABNORMAL LOW (ref 3.5–5.1)
Sodium: 135 mmol/L (ref 135–145)
Total Bilirubin: 1.3 mg/dL — ABNORMAL HIGH (ref 0.3–1.2)
Total Protein: 7.2 g/dL (ref 6.5–8.1)

## 2020-01-10 LAB — HEMOGLOBIN A1C
Hgb A1c MFr Bld: 10 % — ABNORMAL HIGH (ref 4.8–5.6)
Mean Plasma Glucose: 240.3 mg/dL

## 2020-01-10 LAB — CBC WITH DIFFERENTIAL/PLATELET
Abs Immature Granulocytes: 0.06 10*3/uL (ref 0.00–0.07)
Basophils Absolute: 0 10*3/uL (ref 0.0–0.1)
Basophils Relative: 0 %
Eosinophils Absolute: 0.1 10*3/uL (ref 0.0–0.5)
Eosinophils Relative: 1 %
HCT: 45.6 % (ref 39.0–52.0)
Hemoglobin: 15.7 g/dL (ref 13.0–17.0)
Immature Granulocytes: 1 %
Lymphocytes Relative: 12 %
Lymphs Abs: 1.4 10*3/uL (ref 0.7–4.0)
MCH: 30.7 pg (ref 26.0–34.0)
MCHC: 34.4 g/dL (ref 30.0–36.0)
MCV: 89.1 fL (ref 80.0–100.0)
Monocytes Absolute: 1 10*3/uL (ref 0.1–1.0)
Monocytes Relative: 9 %
Neutro Abs: 9.1 10*3/uL — ABNORMAL HIGH (ref 1.7–7.7)
Neutrophils Relative %: 77 %
Platelets: 268 10*3/uL (ref 150–400)
RBC: 5.12 MIL/uL (ref 4.22–5.81)
RDW: 11.9 % (ref 11.5–15.5)
WBC: 11.7 10*3/uL — ABNORMAL HIGH (ref 4.0–10.5)
nRBC: 0 % (ref 0.0–0.2)

## 2020-01-10 LAB — ECHOCARDIOGRAM COMPLETE
Height: 73 in
Weight: 4232.83 oz

## 2020-01-10 LAB — TROPONIN I (HIGH SENSITIVITY)
Troponin I (High Sensitivity): 754 ng/L (ref <18)
Troponin I (High Sensitivity): >27000 ng/L (ref <18)
Troponin I (High Sensitivity): >27000 ng/L (ref <18)

## 2020-01-10 LAB — PROTIME-INR
INR: 1 (ref 0.8–1.2)
Prothrombin Time: 13.2 seconds (ref 11.4–15.2)

## 2020-01-10 LAB — LIPID PANEL
Cholesterol: 158 mg/dL (ref 0–200)
HDL: 39 mg/dL — ABNORMAL LOW (ref >40)
LDL Cholesterol: 100 mg/dL — ABNORMAL HIGH (ref 0–99)
Total CHOL/HDL Ratio: 4.1 RATIO
Triglycerides: 96 mg/dL (ref <150)
VLDL: 19 mg/dL (ref 0–40)

## 2020-01-10 LAB — APTT: aPTT: 25 seconds (ref 24–36)

## 2020-01-10 LAB — POCT ACTIVATED CLOTTING TIME
Activated Clotting Time: 274 seconds
Activated Clotting Time: 285 seconds

## 2020-01-10 LAB — GLUCOSE, CAPILLARY
Glucose-Capillary: 338 mg/dL — ABNORMAL HIGH (ref 70–99)
Glucose-Capillary: 295 mg/dL — ABNORMAL HIGH (ref 70–99)
Glucose-Capillary: 248 mg/dL — ABNORMAL HIGH (ref 70–99)
Glucose-Capillary: 230 mg/dL — ABNORMAL HIGH (ref 70–99)

## 2020-01-10 LAB — HIV ANTIBODY (ROUTINE TESTING W REFLEX): HIV Screen 4th Generation wRfx: NONREACTIVE

## 2020-01-10 LAB — MRSA PCR SCREENING: MRSA by PCR: NEGATIVE

## 2020-01-10 LAB — SARS CORONAVIRUS 2 BY RT PCR (HOSPITAL ORDER, PERFORMED IN ~~LOC~~ HOSPITAL LAB): SARS Coronavirus 2: NEGATIVE

## 2020-01-10 SURGERY — CORONARY/GRAFT ACUTE MI REVASCULARIZATION
Anesthesia: LOCAL

## 2020-01-10 MED ORDER — HEPARIN SODIUM (PORCINE) 1000 UNIT/ML IJ SOLN
INTRAMUSCULAR | Status: AC
  Filled 2020-01-10: qty 2

## 2020-01-10 MED ORDER — SODIUM CHLORIDE 0.9% FLUSH: 3.0000 mL | INTRAVENOUS | Status: DC | PRN

## 2020-01-10 MED ORDER — VERAPAMIL HCL 2.5 MG/ML IV SOLN
INTRAVENOUS | Status: AC
  Filled 2020-01-10: qty 2

## 2020-01-10 MED ORDER — CARVEDILOL 3.125 MG PO TABS: 6.2500 mg | ORAL_TABLET | Freq: Two times a day (BID) | ORAL | Status: DC

## 2020-01-10 MED ORDER — SODIUM CHLORIDE 0.9 % WEIGHT BASED INFUSION: 1.0000 mL/kg/h | INTRAVENOUS | Status: AC

## 2020-01-10 MED ORDER — PANTOPRAZOLE SODIUM 40 MG PO TBEC
40.0000 mg | DELAYED_RELEASE_TABLET | Freq: Every day | ORAL | Status: DC
  Administered 2020-01-10 – 2020-01-12 (×3): 40 mg via ORAL
  Filled 2020-01-10 (×3): qty 1

## 2020-01-10 MED ORDER — PERFLUTREN LIPID MICROSPHERE
INTRAVENOUS | Status: AC
  Administered 2020-01-10: 2 mL via INTRAVENOUS
  Filled 2020-01-10: qty 10

## 2020-01-10 MED ORDER — PERFLUTREN LIPID MICROSPHERE
1.0000 mL | INTRAVENOUS | Status: AC | PRN
  Filled 2020-01-10: qty 10

## 2020-01-10 MED ORDER — LABETALOL HCL 5 MG/ML IV SOLN: 10.0000 mg | INTRAVENOUS | Status: AC | PRN

## 2020-01-10 MED ORDER — SODIUM CHLORIDE 0.9 % IV SOLN: 250.0000 mL | INTRAVENOUS | Status: DC | PRN

## 2020-01-10 MED ORDER — ASPIRIN 81 MG PO CHEW
81.0000 mg | CHEWABLE_TABLET | Freq: Every day | ORAL | Status: DC
  Administered 2020-01-10 – 2020-01-12 (×3): 81 mg via ORAL
  Filled 2020-01-10 (×3): qty 1

## 2020-01-10 MED ORDER — IOHEXOL 350 MG/ML SOLN
INTRAVENOUS | Status: DC | PRN
  Administered 2020-01-10: 130 mL via INTRA_ARTERIAL

## 2020-01-10 MED ORDER — ASPIRIN EC 81 MG PO TBEC: 81.0000 mg | DELAYED_RELEASE_TABLET | Freq: Every day | ORAL | Status: DC

## 2020-01-10 MED ORDER — ONDANSETRON HCL 4 MG/2ML IJ SOLN: 4.0000 mg | Freq: Four times a day (QID) | INTRAMUSCULAR | Status: DC | PRN

## 2020-01-10 MED ORDER — NITROGLYCERIN 0.4 MG SL SUBL: 0.4000 mg | SUBLINGUAL_TABLET | SUBLINGUAL | Status: DC | PRN

## 2020-01-10 MED ORDER — VERAPAMIL HCL 2.5 MG/ML IV SOLN
INTRAVENOUS | Status: DC | PRN
  Administered 2020-01-10: 10 mL via INTRA_ARTERIAL

## 2020-01-10 MED ORDER — IOHEXOL 350 MG/ML SOLN
INTRAVENOUS | Status: AC
  Filled 2020-01-10: qty 1

## 2020-01-10 MED ORDER — HYDRALAZINE HCL 20 MG/ML IJ SOLN: 10.0000 mg | INTRAMUSCULAR | Status: AC | PRN

## 2020-01-10 MED ORDER — ONDANSETRON HCL 4 MG/2ML IJ SOLN
INTRAMUSCULAR | Status: DC | PRN
  Administered 2020-01-10: 4 mg via INTRAVENOUS

## 2020-01-10 MED ORDER — LIVING WELL WITH DIABETES BOOK
Freq: Once | Status: AC
  Filled 2020-01-10: qty 1

## 2020-01-10 MED ORDER — ALUM & MAG HYDROXIDE-SIMETH 200-200-20 MG/5ML PO SUSP: 30.0000 mL | ORAL | Status: DC | PRN

## 2020-01-10 MED ORDER — NITROGLYCERIN IN D5W 200-5 MCG/ML-% IV SOLN
INTRAVENOUS | Status: AC
  Filled 2020-01-10: qty 250

## 2020-01-10 MED ORDER — SODIUM CHLORIDE 0.9% FLUSH
3.0000 mL | Freq: Two times a day (BID) | INTRAVENOUS | Status: DC
  Administered 2020-01-10 – 2020-01-11 (×3): 3 mL via INTRAVENOUS

## 2020-01-10 MED ORDER — TICAGRELOR 90 MG PO TABS
ORAL_TABLET | ORAL | Status: DC | PRN
  Administered 2020-01-10: 180 mg via ORAL

## 2020-01-10 MED ORDER — TICAGRELOR 90 MG PO TABS
ORAL_TABLET | ORAL | Status: AC
  Filled 2020-01-10: qty 2

## 2020-01-10 MED ORDER — INSULIN ASPART 100 UNIT/ML ~~LOC~~ SOLN
0.0000 [IU] | Freq: Three times a day (TID) | SUBCUTANEOUS | Status: DC
  Administered 2020-01-10: 7 [IU] via SUBCUTANEOUS
  Administered 2020-01-10: 11 [IU] via SUBCUTANEOUS
  Administered 2020-01-10: 15 [IU] via SUBCUTANEOUS
  Administered 2020-01-10: 7 [IU] via SUBCUTANEOUS
  Administered 2020-01-11: 11 [IU] via SUBCUTANEOUS
  Administered 2020-01-11: 4 [IU] via SUBCUTANEOUS
  Administered 2020-01-11: 7 [IU] via SUBCUTANEOUS
  Administered 2020-01-12 (×2): 4 [IU] via SUBCUTANEOUS

## 2020-01-10 MED ORDER — NITROPRUSSIDE SODIUM-NACL 20-0.9 MG/100ML-% IV SOLN
INTRAVENOUS | Status: AC
  Filled 2020-01-10: qty 100

## 2020-01-10 MED ORDER — ENOXAPARIN SODIUM 40 MG/0.4ML ~~LOC~~ SOLN: 40.0000 mg | SUBCUTANEOUS | Status: DC

## 2020-01-10 MED ORDER — CARVEDILOL 12.5 MG PO TABS
12.5000 mg | ORAL_TABLET | Freq: Two times a day (BID) | ORAL | Status: DC
  Administered 2020-01-10 – 2020-01-11 (×4): 12.5 mg via ORAL
  Filled 2020-01-10 (×4): qty 1

## 2020-01-10 MED ORDER — SODIUM CHLORIDE 0.9 % IV SOLN
INTRAVENOUS | Status: AC | PRN
  Administered 2020-01-10: 10 mL/h via INTRAVENOUS

## 2020-01-10 MED ORDER — HEPARIN (PORCINE) IN NACL 1000-0.9 UT/500ML-% IV SOLN
INTRAVENOUS | Status: DC | PRN
  Administered 2020-01-10 (×2): 500 mL

## 2020-01-10 MED ORDER — FAMOTIDINE IN NACL 20-0.9 MG/50ML-% IV SOLN
INTRAVENOUS | Status: AC | PRN
  Administered 2020-01-10: 20 mg via INTRAVENOUS

## 2020-01-10 MED ORDER — ATORVASTATIN CALCIUM 80 MG PO TABS
80.0000 mg | ORAL_TABLET | Freq: Every day | ORAL | Status: DC
  Administered 2020-01-10 – 2020-01-12 (×3): 80 mg via ORAL
  Filled 2020-01-10 (×4): qty 1

## 2020-01-10 MED ORDER — ACETAMINOPHEN 325 MG PO TABS: 650.0000 mg | ORAL_TABLET | ORAL | Status: DC | PRN

## 2020-01-10 MED ORDER — LIDOCAINE HCL (PF) 1 % IJ SOLN
INTRAMUSCULAR | Status: DC | PRN
  Administered 2020-01-10: 2 mL

## 2020-01-10 MED ORDER — POTASSIUM CHLORIDE CRYS ER 20 MEQ PO TBCR
60.0000 meq | EXTENDED_RELEASE_TABLET | Freq: Once | ORAL | Status: AC
  Administered 2020-01-10: 60 meq via ORAL
  Filled 2020-01-10: qty 3

## 2020-01-10 MED ORDER — FAMOTIDINE IN NACL 20-0.9 MG/50ML-% IV SOLN
INTRAVENOUS | Status: AC
  Filled 2020-01-10: qty 50

## 2020-01-10 MED ORDER — NITROGLYCERIN 1 MG/10 ML FOR IR/CATH LAB
INTRA_ARTERIAL | Status: DC | PRN
  Administered 2020-01-10 (×2): 200 ug via INTRACORONARY

## 2020-01-10 MED ORDER — ONDANSETRON HCL 4 MG/2ML IJ SOLN
INTRAMUSCULAR | Status: AC
  Filled 2020-01-10: qty 2

## 2020-01-10 MED ORDER — HEPARIN (PORCINE) IN NACL 1000-0.9 UT/500ML-% IV SOLN
INTRAVENOUS | Status: AC
  Filled 2020-01-10: qty 1000

## 2020-01-10 MED ORDER — TICAGRELOR 90 MG PO TABS
90.0000 mg | ORAL_TABLET | Freq: Two times a day (BID) | ORAL | Status: DC
  Administered 2020-01-10 – 2020-01-12 (×5): 90 mg via ORAL
  Filled 2020-01-10 (×5): qty 1

## 2020-01-10 MED ORDER — ENOXAPARIN SODIUM 40 MG/0.4ML ~~LOC~~ SOLN
40.0000 mg | SUBCUTANEOUS | Status: DC
  Administered 2020-01-11 – 2020-01-12 (×2): 40 mg via SUBCUTANEOUS
  Filled 2020-01-10 (×2): qty 0.4

## 2020-01-10 MED ORDER — CHLORHEXIDINE GLUCONATE CLOTH 2 % EX PADS: 6.0000 | MEDICATED_PAD | Freq: Every day | CUTANEOUS | Status: DC

## 2020-01-10 MED ORDER — HEPARIN SODIUM (PORCINE) 1000 UNIT/ML IJ SOLN
INTRAMUSCULAR | Status: DC | PRN
  Administered 2020-01-10: 12000 [IU] via INTRAVENOUS
  Administered 2020-01-10: 3000 [IU] via INTRAVENOUS

## 2020-01-10 MED ORDER — LIDOCAINE HCL (PF) 1 % IJ SOLN
INTRAMUSCULAR | Status: AC
  Filled 2020-01-10: qty 30

## 2020-01-10 MED ORDER — ALUM & MAG HYDROXIDE-SIMETH 200-200-20 MG/5ML PO SUSP
15.0000 mL | ORAL | Status: DC | PRN
  Filled 2020-01-10: qty 30

## 2020-01-10 MED ORDER — NITROGLYCERIN IN D5W 200-5 MCG/ML-% IV SOLN: 0.0000 ug/min | INTRAVENOUS | Status: DC

## 2020-01-10 MED ORDER — NITROGLYCERIN IN D5W 200-5 MCG/ML-% IV SOLN
INTRAVENOUS | Status: AC | PRN
  Administered 2020-01-10: 10 ug/min via INTRAVENOUS

## 2020-01-10 MED ORDER — NITROGLYCERIN 1 MG/10 ML FOR IR/CATH LAB
INTRA_ARTERIAL | Status: AC
  Filled 2020-01-10: qty 10

## 2020-01-10 MED ORDER — LOSARTAN POTASSIUM 25 MG PO TABS
25.0000 mg | ORAL_TABLET | Freq: Every day | ORAL | Status: DC
  Administered 2020-01-10 – 2020-01-12 (×3): 25 mg via ORAL
  Filled 2020-01-10 (×4): qty 1

## 2020-01-10 SURGICAL SUPPLY — 20 items
BALLN SAPPHIRE 2.5X12 (BALLOONS) ×2
BALLN ~~LOC~~ EMERGE MR 3.75X15 (BALLOONS) ×2
BALLOON SAPPHIRE 2.5X12 (BALLOONS) IMPLANT
BALLOON ~~LOC~~ EMERGE MR 3.75X15 (BALLOONS) IMPLANT
CATH INFINITI 5FR ANG PIGTAIL (CATHETERS) ×1 IMPLANT
CATH INFINITI JR4 5F (CATHETERS) ×1 IMPLANT
CATH VISTA GUIDE 6FR XBLAD3.5 (CATHETERS) ×1 IMPLANT
DEVICE RAD COMP TR BAND LRG (VASCULAR PRODUCTS) ×1 IMPLANT
ELECT DEFIB PAD ADLT CADENCE (PAD) ×1 IMPLANT
GLIDESHEATH SLEND SS 6F .021 (SHEATH) ×1 IMPLANT
GUIDEWIRE INQWIRE 1.5J.035X260 (WIRE) IMPLANT
INQWIRE 1.5J .035X260CM (WIRE) ×2
KIT ENCORE 26 ADVANTAGE (KITS) ×1 IMPLANT
KIT HEART LEFT (KITS) ×2 IMPLANT
PACK CARDIAC CATHETERIZATION (CUSTOM PROCEDURE TRAY) ×2 IMPLANT
STENT SYNERGY XD 3.50X20 (Permanent Stent) IMPLANT
SYNERGY XD 3.50X20 (Permanent Stent) ×2 IMPLANT
TRANSDUCER W/STOPCOCK (MISCELLANEOUS) ×2 IMPLANT
TUBING CIL FLEX 10 FLL-RA (TUBING) ×2 IMPLANT
WIRE ASAHI PROWATER 180CM (WIRE) ×1 IMPLANT

## 2020-01-10 NOTE — Progress Notes (Signed)
Progress Note  Patient Name: [name] Date of Encounter: 01/10/2020  Primary Cardiologist: New- Dr [name]  Subjective   Has slight mid sternal chest pain. No dyspnea. No palpitations.   Inpatient Medications    Scheduled Meds: . aspirin  81 mg Oral Daily  . atorvastatin  80 mg Oral Daily  . carvedilol  12.5 mg Oral BID WC  . [START ON 01/11/2020] enoxaparin (LOVENOX) injection  40 mg Subcutaneous Q24H  . insulin aspart  0-20 Units Subcutaneous TID WC  . losartan  25 mg Oral Daily  . pantoprazole  40 mg Oral Daily  . potassium chloride  60 mEq Oral Once  . sodium chloride flush  3 mL Intravenous Q12H  . ticagrelor  90 mg Oral BID   Continuous Infusions: . sodium chloride    . sodium chloride    . sodium chloride    . nitroGLYCERIN 20 mcg/min (01/10/20 0300)   PRN Meds: sodium chloride, acetaminophen, alum & mag hydroxide-simeth, nitroGLYCERIN, ondansetron (ZOFRAN) IV, sodium chloride flush   Vital Signs    Vitals:   01/10/20 0500 01/10/20 0530 01/10/20 0600 01/10/20 0630  BP: 128/89 (!) 125/97 (!) 139/96 (!) 129/91  Pulse: (!) 107 97 97 (!) 102  Resp: (!) 28 11 (!) 21 (!) 21  Temp:      TempSrc:      SpO2: 91% 96% 93% 92%  Weight:      Height:        Intake/Output Summary (Last 24 hours) at 01/10/2020 0743 Last data filed at 01/10/2020 0600 Gross per 24 hour  Intake 20 ml  Output 1400 ml  Net -1380 ml   Last 3 Weights 01/10/2020 01/10/2020 09/13/2019  Weight (lbs) 264 lb 8.8 oz 264 lb 8.8 oz 266 lb  Weight (kg) 120 kg 120 kg 120.657 kg      Telemetry    NSR with frequent PVCs, 8 beat run NSVT - Personally Reviewed  ECG    NSR with rate 109. Persistent ST elevation 2-3 mm in leads V2-4. Q waves anteriorly.   - Personally Reviewed  Physical Exam   GEN:  No acute distress.  obese Neck: No JVD Cardiac: RRR, no murmurs, rubs, or gallops.  Respiratory: Clear to auscultation bilaterally. GI: Soft, nontender, non-distended  MS: No edema; No  deformity. Right radial site without hematoma.  Neuro:  Nonfocal  Psych: Normal affect   Labs    High Sensitivity Troponin:   Recent Labs  Lab 01/10/20 0000  TROPONINIHS 754*      Chemistry Recent Labs  Lab 01/10/20 0000 01/10/20 0243  NA 135 134*  K 3.4* 3.4*  CL 97* 99  CO2 21* 18*  GLUCOSE 309* 350*  BUN 14 14  CREATININE 0.82 0.87  CALCIUM 10.1 10.3  PROT 7.2  --   ALBUMIN 4.0  --   AST 41  --   ALT 35  --   ALKPHOS 61  --   BILITOT 1.3*  --   GFRNONAA >60 >60  GFRAA >60 >60  ANIONGAP 17* 17*     Hematology Recent Labs  Lab 01/10/20 0000 01/10/20 0243  WBC 11.7* 13.3*  RBC 5.12 4.95  HGB 15.7 15.3  HCT 45.6 42.8  MCV 89.1 86.5  MCH 30.7 30.9  MCHC 34.4 35.7  RDW 11.9 11.9  PLT 268 260    BNPNo results for input(s): BNP, PROBNP in the last 168 hours.   DDimer No results for input(s): DDIMER in the last  168 hours.   Radiology    CARDIAC CATHETERIZATION  Result Date: 01/10/2020  Prox LAD to Mid LAD lesion is 100% stenosed.  1st Diag lesion is 75% stenosed.  Post intervention, there is a 0% residual stenosis.  A drug-eluting stent was successfully placed using a SYNERGY XD 3.50X20.  LV end diastolic pressure is normal.  1. Single vessel occlusive CAD. 100% proximal to mid LAD. 75% first diagonal 2. Normal LVEDP 15 mm Hg 3. Successful PCI of the LAD with DES x 1 Plan: DAPT for one year. Risk factor modification. Will assess LV function with Echo.   DG Chest Port 1 View  Result Date: 01/10/2020 CLINICAL DATA:  Chest pain. EXAM: PORTABLE CHEST 1 VIEW COMPARISON:  None. FINDINGS: The heart size and mediastinal contours are within normal limits. Both lungs are clear. The visualized skeletal structures are unremarkable. IMPRESSION: No active disease. Electronically Signed   By: Constance Holster M.D.   On: 01/10/2020 02:28    Cardiac Studies   Procedures  Coronary/Graft Acute MI Revascularization  CORONARY STENT INTERVENTION  LEFT HEART CATH  AND CORONARY ANGIOGRAPHY  Conclusion    Prox LAD to Mid LAD lesion is 100% stenosed.  1st Diag lesion is 75% stenosed.  Post intervention, there is a 0% residual stenosis.  A drug-eluting stent was successfully placed using a SYNERGY XD 3.50X20.  LV end diastolic pressure is normal.   1. Single vessel occlusive CAD. 100% proximal to mid LAD. 75% first diagonal 2. Normal LVEDP 15 mm Hg 3. Successful PCI of the LAD with DES x 1  Plan: DAPT for one year. Risk factor modification. Will assess LV function with Echo.       Patient Profile     69 y.o. male with uncontrolled DM, HTN, HLD presented with anterior STEMI.   Assessment & Plan    1. 1. Acute Anterior STEMI. Occluded proximal to mid LAD. Reperfusion about 4 hours after onset of pain. S/p emergent stenting of the LAD with DES. Will initiate DAPT for one year. Add beta blocker therapy. Will wean IV Ntg. Will also add ARB since I suspect significant myocardial injury, BP is high and renal function OK. Will assess LV function by Echo today. 2. DM type 2- uncontrolled. A1c 10.1. SSI while inpatient. Patient was only taking glimepiride at home. Had stopped metformin because he thought this was causing lactic acid build up in his joints. Had a rash on his chest from either Januvia or Jardiance- he is not sure which. Would consider Farxiga at discharge and resume metformin.  3. HTN. Will start Coreg 12.5 mg bid. Add losartan 25 mg daily. If LV function is poor consider aldactone.  4. Obesity.  5. Hypercholesterolemia. Patient reports taking atorvastatin. LDL 100. Goal < 70. Will place on high dose atorvastatin 80 mg daily.  6. NSVT/PVCs. Still early post reperfusion. Will monitor in unit today and see response to beta blockers.    For questions or updates, please contact [place] Please consult [email or web address] for contact info under        Signed, Tameca Jerez [name], MD  01/10/2020, 7:43 AM

## 2020-01-10 NOTE — Plan of Care (Signed)
  Problem: Clinical Measurements: Goal: Ability to maintain clinical measurements within normal limits will improve Outcome: Progressing Goal: Cardiovascular complication will be avoided Outcome: Progressing   Problem: Nutrition: Goal: Adequate nutrition will be maintained Outcome: Progressing   Problem: Pain Managment: Goal: General experience of comfort will improve Outcome: Progressing

## 2020-01-10 NOTE — Progress Notes (Addendum)
Echocardiogram 2D Echocardiogram with Definity has been performed.  01/10/2020 12:51 PM Kelby Aline., MHA, RVT, RDCS, RDMS

## 2020-01-10 NOTE — ED Triage Notes (Signed)
Pt presents to ED from home BIB [facility]. Pt presents as CODE STEMI pt reports CP since 2100. Took tums with no relief. EMS gave 324 aspirin, nitro x2, and 5ooml NS.

## 2020-01-10 NOTE — H&P (Signed)
Cardiology Admission History and Physical:   Patient ID: [name] MRN: [id number]; DOB: 07/30/1951   Admission date: 01/09/2020  Primary Care Provider: Salvatore Marvel, PA-C Primary Cardiologist: No primary care provider on file. New Primary Electrophysiologist:  None   Chief Complaint:  Chest pain  Patient Profile:   [name] is a 69 y.o. male with history of DM type 2 and obesity presents with acute anterior STEMI   History of Present Illness:   [name] has a history of DM on oral therapy. Reports history of elevated BP. Tonight he had eaten [place] food. He went outside to let his cats in and got hot. Developed substernal chest pain with a lot of belching. Took Tums without relief. Pain began about 8:30 pm. When pain did not abate EMS was called. Ecg showed ST elevation 4 mm in the anterior leads with septal Q waves. No prior cardiac history.    Past Medical History:  Diagnosis Date  . Allergy   . Bilateral carpal tunnel syndrome 10/19/2019  . Chronic kidney disease   . Delusional disorder ([place])   . Diabetes type 2, uncontrolled ([place]) 03/26/2017    Past Surgical History:  Procedure Laterality Date  . KNEE SURGERY Left   . TONSILLECTOMY Bilateral      Medications Prior to Admission: Prior to Admission medications   Medication Sig Start Date End Date Taking? Authorizing Provider  glimepiride (AMARYL) 2 MG tablet  06/26/19   [provider]  ibuprofen (ADVIL) 200 MG tablet Take 200 mg by mouth every 6 (six) hours as needed for mild pain (inflammation).    [provider]  Multiple Vitamin (MULTIVITAMIN) tablet Take 1 tablet by mouth daily.    [provider]  Oral Electrolytes (BUFFERED SALT PO) Take by mouth.    [provider]  VITAMIN E PO Take 1 tablet by mouth daily.    [provider]     Allergies:    Allergies  Allergen Reactions  . Achromycin [Tetracycline] Rash    Social History:   Social History    Socioeconomic History  . Marital status: Single    Spouse name: Not on file  . Number of children: Not on file  . Years of education: Not on file  . Highest education level: Not on file  Occupational History  . Not on file  Tobacco Use  . Smoking status: Never Smoker  . Smokeless tobacco: Never Used  Substance and Sexual Activity  . Alcohol use: No  . Drug use: No  . Sexual activity: Never  Other Topics Concern  . Not on file  Social History Narrative  . Not on file   Social Determinants of Health   Financial Resource Strain:   . Difficulty of Paying Living Expenses:   Food Insecurity:   . Worried About Charity fundraiser in the Last Year:   . Arboriculturist in the Last Year:   Transportation Needs:   . Film/video editor (Medical):   Marland Kitchen Lack of Transportation (Non-Medical):   Physical Activity:   . Days of Exercise per Week:   . Minutes of Exercise per Session:   Stress:   . Feeling of Stress :   Social Connections:   . Frequency of Communication with Friends and Family:   . Frequency of Social Gatherings with Friends and Family:   . Attends Religious Services:   . Active Member of Clubs or Organizations:   . Attends Club  or Organization Meetings:   Marland Kitchen Marital Status:   Intimate Partner Violence:   . Fear of Current or Ex-Partner:   . Emotionally Abused:   Marland Kitchen Physically Abused:   . Sexually Abused:     Family History:  No family history of CAD The patient's family history is negative for Cancer.    ROS:  Please see the history of present illness.  All other ROS reviewed and negative.     Physical Exam/Data:   Vitals:   01/10/20 0000  BP: (!) 184/135  Pulse: (!) 101  Temp: 98.1 F (36.7 C)  TempSrc: Oral  SpO2: 95%   No intake or output data in the 24 hours ending 01/10/20 0118 Last 3 Weights 09/13/2019 03/26/2017 03/06/2017  Weight (lbs) 266 lb 266 lb 12.8 oz 264 lb 12.8 oz  Weight (kg) 120.657 kg 121.02 kg 120.112 kg     There is no height  or weight on file to calculate BMI.  General:  Well nourished, obese WM, in moderate distress HEENT: normal Lymph: no adenopathy Neck: no JVD Endocrine:  No thryomegaly Vascular: No carotid bruits; FA pulses 2+ bilaterally without bruits  Cardiac:  normal S1, S2; RRR; no murmur  Lungs:  clear to auscultation bilaterally, no wheezing, rhonchi or rales  Abd: soft, nontender, no hepatomegaly  Ext: no edema Musculoskeletal:  No deformities, BUE and BLE strength normal and equal Skin: warm and dry  Neuro:  CNs 2-12 intact, no focal abnormalities noted Psych:  Normal affect    EKG:  The ECG that was done today was personally reviewed and demonstrates NSR with ST elevation in anterior leads and septal Q waves  Relevant CV Studies: none  Laboratory Data:  High Sensitivity Troponin:  No results for input(s): TROPONINIHS in the last 720 hours.    ChemistryNo results for input(s): NA, K, CL, CO2, GLUCOSE, BUN, CREATININE, CALCIUM, GFRNONAA, GFRAA, ANIONGAP in the last 168 hours.  No results for input(s): PROT, ALBUMIN, AST, ALT, ALKPHOS, BILITOT in the last 168 hours. HematologyNo results for input(s): WBC, RBC, HGB, HCT, MCV, MCH, MCHC, RDW, PLT in the last 168 hours. BNPNo results for input(s): BNP, PROBNP in the last 168 hours.  DDimer No results for input(s): DDIMER in the last 168 hours.   Radiology/Studies:  CARDIAC CATHETERIZATION  Result Date: 01/10/2020  Prox LAD to Mid LAD lesion is 100% stenosed.  1st Diag lesion is 75% stenosed.  Post intervention, there is a 0% residual stenosis.  A drug-eluting stent was successfully placed using a SYNERGY XD 3.50X20.  LV end diastolic pressure is normal.  1. Single vessel occlusive CAD. 100% proximal to mid LAD. 75% first diagonal 2. Normal LVEDP 15 mm Hg 3. Successful PCI of the LAD with DES x 1 Plan: DAPT for one year. Risk factor modification. Will assess LV function with Echo.       TIMI Risk Score for ST  Elevation MI:   The  patient's TIMI risk score is 5, which indicates a 12.4% risk of all cause mortality at 30 days.    Assessment and Plan:   1. Acute anterior STEMI. Occluded proximal to mid LAD. Reperfusion about 4 hours after onset of pain. S/p emergent stenting of the LAD with DES. Will initiate DAPT for one year. Add beta blocker therapy. IV Ntg overnight. Will likely need ARB.  2. DM type 2. Check A1c. SSI while inpatient. May want to consider Jardiance at DC 3. HTN. Initiate IV Ntg and start beta blocker. If  renal function OK will initiate ARB.  4. Obesity.  5. Lipid status is unknown. Will initiate high dose statin.   Severity of Illness: The appropriate patient status for this patient is INPATIENT. Inpatient status is judged to be reasonable and necessary in order to provide the required intensity of service to ensure the patient's safety. The patient's presenting symptoms, physical exam findings, and initial radiographic and laboratory data in the context of their chronic comorbidities is felt to place them at high risk for further clinical deterioration. Furthermore, it is not anticipated that the patient will be medically stable for discharge from the hospital within 2 midnights of admission. The following factors support the patient status of inpatient.   " The patient's presenting symptoms include acute chest pain. " The worrisome physical exam findings include none. " The initial radiographic and laboratory data are worrisome because of ST elevation anteriorly on Ecg. " The chronic co-morbidities include DM, HTN, obesity..   * I certify that at the point of admission it is my clinical judgment that the patient will require inpatient hospital care spanning beyond 2 midnights from the point of admission due to high intensity of service, high risk for further deterioration and high frequency of surveillance required.*    For questions or updates, please contact [place] Please consult  [email or web address] for contact info under        Signed, Viyan Rosamond [name], MD  01/10/2020 1:18 AM

## 2020-01-10 NOTE — Progress Notes (Signed)
CRITICAL VALUE ALERT  Critical Value:  Trop >27,000  Date & Time Notified:  01/10/20 1109  Provider Notified: Dr. [name]  Orders Received/Actions taken: No new orders; results within expected parameters.  Joellen Jersey, RN

## 2020-01-10 NOTE — TOC Benefit Eligibility Note (Signed)
Transition of Care [facility]) Benefit Eligibility Note    Patient Details  Name: [name] MRN: [id number] Date of Birth: 1950-10-09   Medication/Dose: BRILINTA   90 MG BID  Covered?: Yes  Tier: 3 Drug  Prescription Coverage [facility]  Spoke with Person/Company/Phone Number:: BRITTANY @ [employer] @ [phone]  Co-Pay: $ 45.00  Prior Approval: No  Deductible: Met  Additional Notes: JARDIANCE 10 MG DAILY  COVER- YES CO-PAY- $ 45.00 TIER- 3 DRUG P/A-NO     and    FARXIGA  [place]    Memory Argue Phone Number: 01/10/2020, 12:29 PM

## 2020-01-10 NOTE — Progress Notes (Signed)
[id number] Began ed with pt who voiced understanding. Stopped education as pt getting a little sleepy. Discussed MI restrictions, importance of brilinta with stent, and importance of statin. Will continue ed tomorrow. Pt voiced understanding of ed. Graylon Good RN BSN 01/10/2020 2:33 PM

## 2020-01-10 NOTE — Progress Notes (Signed)
Inpatient Diabetes Program Recommendations  AACE/ADA: New Consensus Statement on Inpatient Glycemic Control (2015)  Target Ranges:  Prepandial:   less than 140 mg/dL      Peak postprandial:   less than 180 mg/dL (1-2 hours)      Critically ill patients:  140 - 180 mg/dL   Lab Results  Component Value Date   GLUCAP 338 (H) 01/10/2020   HGBA1C 10.0 (H) 01/10/2020    Review of Glycemic Control Results for [name], [name] (MRN [id number]) as of 01/10/2020 09:47  Ref. Range 01/10/2020 06:08  Glucose-Capillary Latest Ref Range: 70 - 99 mg/dL 338 (H)   Diabetes history: DM2 Outpatient Diabetes medications: Amaryl 2 mg qd Current orders for Inpatient glycemic control: Novolog resistant correction scale tid  Inpatient Diabetes Program Recommendations:   While oral meds held: -Levemir 12 units bid (0.2 units/kg x 120 kg) -Add Novolog 0-5 units hs scale  Will plan to speak with patient regarding A1c 10.0(average blood glucose 240 over the past 2-3 months). Ordered Living Well With Diabetes.  Thank you, Nani Gasser. Hanks, RN, MSN, CDE  Diabetes Coordinator Inpatient Glycemic Control Team Team Pager [phone] (8am-5pm) 01/10/2020 10:06 AM

## 2020-01-10 NOTE — Progress Notes (Signed)
Benefits check in process for  Brilinta, Jjardiance and [place]. TOC will f/u with needs. Whitman Hero RN,BSN,CM

## 2020-01-11 DIAGNOSIS — E7800 Pure hypercholesterolemia, unspecified: Secondary | ICD-10-CM

## 2020-01-11 DIAGNOSIS — I10 Essential (primary) hypertension: Secondary | ICD-10-CM

## 2020-01-11 DIAGNOSIS — E1165 Type 2 diabetes mellitus with hyperglycemia: Secondary | ICD-10-CM

## 2020-01-11 LAB — BASIC METABOLIC PANEL
Anion gap: 11 (ref 5–15)
BUN: 15 mg/dL (ref 8–23)
CO2: 22 mmol/L (ref 22–32)
Calcium: 8.8 mg/dL — ABNORMAL LOW (ref 8.9–10.3)
Chloride: 103 mmol/L (ref 98–111)
Creatinine, Ser: 0.99 mg/dL (ref 0.61–1.24)
GFR calc Af Amer: >60 mL/min (ref >60)
GFR calc non Af Amer: >60 mL/min (ref >60)
Glucose, Bld: 279 mg/dL — ABNORMAL HIGH (ref 70–99)
Potassium: 4.3 mmol/L (ref 3.5–5.1)
Sodium: 136 mmol/L (ref 135–145)

## 2020-01-11 LAB — GLUCOSE, CAPILLARY
Glucose-Capillary: 205 mg/dL — ABNORMAL HIGH (ref 70–99)
Glucose-Capillary: 271 mg/dL — ABNORMAL HIGH (ref 70–99)
Glucose-Capillary: 180 mg/dL — ABNORMAL HIGH (ref 70–99)
Glucose-Capillary: 165 mg/dL — ABNORMAL HIGH (ref 70–99)

## 2020-01-11 MED ORDER — INSULIN DETEMIR 100 UNIT/ML ~~LOC~~ SOLN
12.0000 [IU] | Freq: Two times a day (BID) | SUBCUTANEOUS | Status: DC
  Administered 2020-01-11 – 2020-01-12 (×3): 12 [IU] via SUBCUTANEOUS
  Filled 2020-01-11 (×5): qty 0.12

## 2020-01-11 NOTE — Progress Notes (Signed)
Progress Note  Patient Name: [name] Date of Encounter: 01/11/2020  Primary Cardiologist: New- Dr [name]  Subjective   Feels great today. No chest pain. No dyspnea. No palpitations.   Inpatient Medications    Scheduled Meds: . aspirin  81 mg Oral Daily  . atorvastatin  80 mg Oral Daily  . carvedilol  12.5 mg Oral BID WC  . Chlorhexidine Gluconate Cloth  6 each Topical Daily  . enoxaparin (LOVENOX) injection  40 mg Subcutaneous Q24H  . insulin aspart  0-20 Units Subcutaneous TID WC  . insulin detemir  12 Units Subcutaneous BID  . losartan  25 mg Oral Daily  . pantoprazole  40 mg Oral Daily  . sodium chloride flush  3 mL Intravenous Q12H  . ticagrelor  90 mg Oral BID   Continuous Infusions: . sodium chloride Stopped (01/11/20 0402)  . sodium chloride    . nitroGLYCERIN Stopped (01/10/20 1233)   PRN Meds: sodium chloride, acetaminophen, alum & mag hydroxide-simeth, nitroGLYCERIN, ondansetron (ZOFRAN) IV, sodium chloride flush   Vital Signs    Vitals:   01/11/20 0400 01/11/20 0500 01/11/20 0600 01/11/20 0614  BP:  110/69 106/72   Pulse: 91 74 73 78  Resp:      Temp:      TempSrc:      SpO2: 98% 95% 95% 97%  Weight:      Height:        Intake/Output Summary (Last 24 hours) at 01/11/2020 0842 Last data filed at 01/11/2020 0600 Gross per 24 hour  Intake 1314.64 ml  Output 2000 ml  Net -685.36 ml   Last 3 Weights 01/10/2020 01/10/2020 09/13/2019  Weight (lbs) 264 lb 8.8 oz 264 lb 8.8 oz 266 lb  Weight (kg) 120 kg 120 kg 120.657 kg      Telemetry    NSR with  PVCs,  - Personally Reviewed  ECG    NSR with rate 80. ST elevation in precordial leads is better.  Q waves V1-2.   - Personally Reviewed  Physical Exam   GEN:  No acute distress.  obese Neck: No JVD Cardiac: RRR, no murmurs, rubs, or gallops.  Respiratory: Clear to auscultation bilaterally. GI: Soft, nontender, non-distended  MS: No edema; No deformity. Right radial site without hematoma.   Neuro:  Nonfocal  Psych: Normal affect   Labs    High Sensitivity Troponin:   Recent Labs  Lab 01/10/20 0000 01/10/20 0857 01/10/20 1052  TROPONINIHS 754* >27,000* >27,000*      Chemistry Recent Labs  Lab 01/10/20 0000 01/10/20 0243  NA 135 134*  K 3.4* 3.4*  CL 97* 99  CO2 21* 18*  GLUCOSE 309* 350*  BUN 14 14  CREATININE 0.82 0.87  CALCIUM 10.1 10.3  PROT 7.2  --   ALBUMIN 4.0  --   AST 41  --   ALT 35  --   ALKPHOS 61  --   BILITOT 1.3*  --   GFRNONAA >60 >60  GFRAA >60 >60  ANIONGAP 17* 17*     Hematology Recent Labs  Lab 01/10/20 0000 01/10/20 0243  WBC 11.7* 13.3*  RBC 5.12 4.95  HGB 15.7 15.3  HCT 45.6 42.8  MCV 89.1 86.5  MCH 30.7 30.9  MCHC 34.4 35.7  RDW 11.9 11.9  PLT 268 260    BNPNo results for input(s): BNP, PROBNP in the last 168 hours.   DDimer No results for input(s): DDIMER in the last 168 hours.  Radiology    CARDIAC CATHETERIZATION  Result Date: 01/10/2020  Prox LAD to Mid LAD lesion is 100% stenosed.  1st Diag lesion is 75% stenosed.  Post intervention, there is a 0% residual stenosis.  A drug-eluting stent was successfully placed using a SYNERGY XD 3.50X20.  LV end diastolic pressure is normal.  1. Single vessel occlusive CAD. 100% proximal to mid LAD. 75% first diagonal 2. Normal LVEDP 15 mm Hg 3. Successful PCI of the LAD with DES x 1 Plan: DAPT for one year. Risk factor modification. Will assess LV function with Echo.   DG Chest Port 1 View  Result Date: 01/10/2020 CLINICAL DATA:  Chest pain. EXAM: PORTABLE CHEST 1 VIEW COMPARISON:  None. FINDINGS: The heart size and mediastinal contours are within normal limits. Both lungs are clear. The visualized skeletal structures are unremarkable. IMPRESSION: No active disease. Electronically Signed   By: Constance Holster M.D.   On: 01/10/2020 02:28   ECHOCARDIOGRAM COMPLETE  Result Date: 01/10/2020    ECHOCARDIOGRAM REPORT   Patient Name:   [name] Date of Exam:  01/10/2020 Medical Rec #:  [id number]      Height:       73.0 in Accession #:    [id number]     Weight:       264.6 lb Date of Birth:  February 02, 1951      BSA:          2.423 m Patient Age:    69 years       BP:           136/91 mmHg Patient Gender: M              HR:           94 bpm. Exam Location:  Inpatient Procedure: 2D Echo, Color Doppler, Cardiac Doppler and Intracardiac            Opacification Agent Indications:     CAD Native Vessel 414.01/ I25.10  History:         Patient has no prior history of Echocardiogram examinations.                  CKD; Risk Factors:Diabetes.  Sonographer:     Maudry Mayhew MHA, RDMS, RVT, RDCS Referring Phys:  [id number] [name] M [name] Diagnosing Phys: Mertie Moores MD IMPRESSIONS  1. Left ventricular ejection fraction, by estimation, is 40 to 45%. The left ventricle has mildly decreased function. The left ventricle has no regional wall motion abnormalities. There is moderate concentric left ventricular hypertrophy. Left ventricular diastolic parameters are consistent with Grade I diastolic dysfunction (impaired relaxation).  2. Right ventricular systolic function is normal. The right ventricular size is normal.  3. The mitral valve is grossly normal. No evidence of mitral valve regurgitation.  4. The aortic valve is normal in structure. Aortic valve regurgitation is not visualized. No aortic stenosis is present.  5. IVC measures 2.53cm. FINDINGS  Left Ventricle: Left ventricular ejection fraction, by estimation, is 40 to 45%. The left ventricle has mildly decreased function. The left ventricle has no regional wall motion abnormalities. Severe akinesis of the left ventricular, mid-apical anteroseptal wall. Definity contrast agent was given IV to delineate the left ventricular endocardial borders. The left ventricular internal cavity size was normal in size. There is moderate concentric left ventricular hypertrophy. Left ventricular diastolic parameters are consistent with Grade I  diastolic dysfunction (impaired relaxation). Right Ventricle: The right ventricular size is normal. No increase in right ventricular  wall thickness. Right ventricular systolic function is normal. Left Atrium: Left atrial size was normal in size. Right Atrium: Right atrial size was normal in size. Pericardium: There is no evidence of pericardial effusion. Mitral Valve: The mitral valve is grossly normal. No evidence of mitral valve regurgitation. Tricuspid Valve: The tricuspid valve is grossly normal. Tricuspid valve regurgitation is not demonstrated. Aortic Valve: The aortic valve is normal in structure. Aortic valve regurgitation is not visualized. No aortic stenosis is present. Pulmonic Valve: The pulmonic valve was not well visualized. Pulmonic valve regurgitation is not visualized. Aorta: The aortic root and ascending aorta are structurally normal, with no evidence of dilitation. Venous: IVC measures 2.53cm. IAS/Shunts: The atrial septum is grossly normal.  LEFT VENTRICLE PLAX 2D LVIDd:         3.50 cm  Diastology LVIDs:         2.70 cm  LV e' lateral:   9.36 cm/s LV PW:         1.30 cm  LV E/e' lateral: 8.3 LV IVS:        1.40 cm  LV e' medial:    6.96 cm/s LVOT diam:     2.10 cm  LV E/e' medial:  11.2 LVOT Area:     3.46 cm  RIGHT VENTRICLE TAPSE (M-mode): 2.9 cm LEFT ATRIUM             Index       RIGHT ATRIUM           Index LA diam:        2.70 cm 1.11 cm/m  RA Area:     11.10 cm LA Vol (A2C):   73.0 ml 30.12 ml/m RA Volume:   19.30 ml  7.96 ml/m LA Vol (A4C):   60.5 ml 24.97 ml/m LA Biplane Vol: 70.1 ml 28.93 ml/m   AORTA Ao Root diam: 2.90 cm MITRAL VALVE MV Area (PHT): 5.75 cm     SHUNTS MV Decel Time: 132 msec     Systemic Diam: 2.10 cm MV E velocity: 77.70 cm/s MV A velocity: 106.00 cm/s MV E/A ratio:  0.73 Mertie Moores MD Electronically signed by Mertie Moores MD Signature Date/Time: 01/10/2020/1:30:32 PM    Final (Updated)     Cardiac Studies   Procedures  Coronary/Graft Acute MI  Revascularization  CORONARY STENT INTERVENTION  LEFT HEART CATH AND CORONARY ANGIOGRAPHY  Conclusion    Prox LAD to Mid LAD lesion is 100% stenosed.  1st Diag lesion is 75% stenosed.  Post intervention, there is a 0% residual stenosis.  A drug-eluting stent was successfully placed using a SYNERGY XD 3.50X20.  LV end diastolic pressure is normal.   1. Single vessel occlusive CAD. 100% proximal to mid LAD. 75% first diagonal 2. Normal LVEDP 15 mm Hg 3. Successful PCI of the LAD with DES x 1  Plan: DAPT for one year. Risk factor modification. Will assess LV function with Echo.     Echo 01/11/20: IMPRESSIONS    1. Left ventricular ejection fraction, by estimation, is 40 to 45%. The  left ventricle has mildly decreased function. The left ventricle has no  regional wall motion abnormalities. There is moderate concentric left  ventricular hypertrophy. Left  ventricular diastolic parameters are consistent with Grade I diastolic  dysfunction (impaired relaxation).  2. Right ventricular systolic function is normal. The right ventricular  size is normal.  3. The mitral valve is grossly normal. No evidence of mitral valve  regurgitation.  4. The aortic valve  is normal in structure. Aortic valve regurgitation is  not visualized. No aortic stenosis is present.  5. IVC measures 2.53cm.   Patient Profile     69 y.o. male with uncontrolled DM, HTN, HLD presented with anterior STEMI.   Assessment & Plan    1. 1. Acute Anterior STEMI. Occluded proximal to mid LAD. Reperfusion about 4 hours after onset of pain. S/p emergent stenting of the LAD with DES. Will initiate DAPT for one year. Add beta blocker therapy. On low dose ARB. EF 40-45% with anterior and apical AK.   2. DM type 2- uncontrolled. A1c 10.1. SSI while inpatient. Start levemir as recommended by diabetic coordinator.  Patient was only taking glimepiride at home. Had stopped metformin because he thought this was causing  lactic acid build up in his joints. Had a rash on his chest from either Januvia or Jardiance- he is not sure which. Plan to resume metformin at discharge and rechallenge with Jardiance. Wilder Glade is much more expensive.   3. HTN. onCoreg 12.5 mg bid,  losartan 25 mg daily. Unable to titrate further due to soft BP 4. Obesity.  5. Hypercholesterolemia. Patient reports taking atorvastatin. LDL 100. Goal < 70. Will place on high dose atorvastatin 80 mg daily.  6. NSVT/PVCs. Improved.   Will transfer to telemetry today. Ambulate. Anticipate DC tomorrow.    For questions or updates, please contact [place] Please consult [email or web address] for contact info under        Signed, [name] [name], MD  01/11/2020, 8:42 AM

## 2020-01-11 NOTE — Progress Notes (Signed)
Inpatient Diabetes Program Recommendations  AACE/ADA: New Consensus Statement on Inpatient Glycemic Control (2015)  Target Ranges:  Prepandial:   less than 140 mg/dL      Peak postprandial:   less than 180 mg/dL (1-2 hours)      Critically ill patients:  140 - 180 mg/dL   Lab Results  Component Value Date   GLUCAP 271 (H) 01/11/2020   HGBA1C 10.0 (H) 01/10/2020    Review of Glycemic Control  Diabetes history: DM2 Outpatient Diabetes medications: Amaryl 4 mg qd Current orders for Inpatient glycemic control: Levemir 12 units bid + Novolog resistant correction scale tid  Inpatient Diabetes Program Recommendations:   Spoke with pt about A1C results 10.0 (average blood glucose 240 over the past 2-3 months) and explained what an A1C is, basic pathophysiology of DM Type 2, basic home care, basic diabetes diet nutrition principles, importance of checking CBGs and maintaining good CBG control to prevent long-term and short-term complications. Reviewed signs and symptoms of hyperglycemia and hypoglycemia and how to treat hypoglycemia at home. Also reviewed blood sugar goals at home.   Patient's PCP [facility] increased Amaryl to 4 mg qd on office visit 11/12/19. Patient took himself off of Metformin earlier due to concerned it was causing joint stiffness and discomfort. Patient states willingness to take prescriptions as prescribed and communicate with PCP on appointments with review of CBGs. Reviewed basic nutrition including plate method and limiting sweets, sugary drinks and juice, and carbohydrates.  Thank you, [name]. Analaura Messler, RN, MSN, CDE  Diabetes Coordinator Inpatient Glycemic Control Team Team Pager [phone] (8am-5pm) 01/11/2020 2:31 PM

## 2020-01-11 NOTE — Progress Notes (Addendum)
CARDIAC REHAB PHASE I   PRE:  Rate/Rhythm: 75 SR  BP:  Supine: 101/67  Sitting:   Standing:    SaO2: 98%RA  MODE:  Ambulation: 410 ft   POST:  Rate/Rhythm: 98 SR  BP:  Supine:   Sitting: 105/77  Standing:    SaO2: 96%RA 1053-1140 Pt walked 410 ft on RA with steady gait.  Ankle a little weak which is not new. Completed MI education with pt who voiced understanding. Stressed importance of brilinta again. Reviewed NTG use, heart healthy and low carb food choices, walking for ex, and CRP 2. Referred to [place] CRP 2. Pt stated he does not have smart phone for Virtual App.   Graylon Good, RN BSN  01/11/2020 11:37 AM

## 2020-01-12 ENCOUNTER — Telehealth: Payer: Self-pay | Admitting: Medical

## 2020-01-12 LAB — BASIC METABOLIC PANEL
Anion gap: 6 (ref 5–15)
BUN: 17 mg/dL (ref 8–23)
CO2: 25 mmol/L (ref 22–32)
Calcium: 8.8 mg/dL — ABNORMAL LOW (ref 8.9–10.3)
Chloride: 107 mmol/L (ref 98–111)
Creatinine, Ser: 0.88 mg/dL (ref 0.61–1.24)
GFR calc Af Amer: >60 mL/min (ref >60)
GFR calc non Af Amer: >60 mL/min (ref >60)
Glucose, Bld: 166 mg/dL — ABNORMAL HIGH (ref 70–99)
Potassium: 4.6 mmol/L (ref 3.5–5.1)
Sodium: 138 mmol/L (ref 135–145)

## 2020-01-12 LAB — GLUCOSE, CAPILLARY
Glucose-Capillary: 160 mg/dL — ABNORMAL HIGH (ref 70–99)
Glucose-Capillary: 192 mg/dL — ABNORMAL HIGH (ref 70–99)

## 2020-01-12 MED ORDER — CARVEDILOL 6.25 MG PO TABS: 6.2500 mg | ORAL_TABLET | Freq: Two times a day (BID) | ORAL | 11 refills | Status: AC

## 2020-01-12 MED ORDER — CARVEDILOL 6.25 MG PO TABS
6.2500 mg | ORAL_TABLET | Freq: Two times a day (BID) | ORAL | Status: DC
  Administered 2020-01-12: 6.25 mg via ORAL
  Filled 2020-01-12: qty 1

## 2020-01-12 MED ORDER — LOSARTAN POTASSIUM 25 MG PO TABS: 25.0000 mg | ORAL_TABLET | Freq: Every day | ORAL | 11 refills | Status: AC

## 2020-01-12 MED ORDER — NITROGLYCERIN 0.4 MG SL SUBL: 0.4000 mg | SUBLINGUAL_TABLET | SUBLINGUAL | 1 refills | Status: AC | PRN

## 2020-01-12 MED ORDER — EMPAGLIFLOZIN 10 MG PO TABS
10.0000 mg | ORAL_TABLET | Freq: Every day | ORAL | 3 refills | Status: DC
Start: 2020-01-12 — End: 2020-01-21

## 2020-01-12 MED ORDER — ASPIRIN 81 MG PO CHEW: 81.0000 mg | CHEWABLE_TABLET | Freq: Every day | ORAL | 11 refills | Status: AC

## 2020-01-12 MED ORDER — TICAGRELOR 90 MG PO TABS: 90.0000 mg | ORAL_TABLET | Freq: Two times a day (BID) | ORAL | 3 refills | Status: DC

## 2020-01-12 MED ORDER — ATORVASTATIN CALCIUM 80 MG PO TABS: 80.0000 mg | ORAL_TABLET | Freq: Every day | ORAL | 11 refills | Status: DC

## 2020-01-12 MED ORDER — METFORMIN HCL 500 MG PO TABS: 500.0000 mg | ORAL_TABLET | Freq: Two times a day (BID) | ORAL | Status: AC

## 2020-01-12 MED FILL — ASPIRIN LOW DOSE 81 MG CHEW: 81 | 30 days supply | Qty: 30 | Fill #0

## 2020-01-12 MED FILL — JARDIANCE 10 MG TABLET: 10 | 14 days supply | Qty: 14 | Fill #0

## 2020-01-12 MED FILL — BRILINTA 90 MG TABLET: 90 | 30 days supply | Qty: 60 | Fill #0

## 2020-01-12 MED FILL — LOSARTAN POTASSIUM 25 MG TA: 25 | 30 days supply | Qty: 30 | Fill #0

## 2020-01-12 MED FILL — CARVEDILOL 6.25 MG TABLET: 6.25 | 30 days supply | Qty: 60 | Fill #0

## 2020-01-12 MED FILL — ATORVASTATIN CALCIUM 80 MG: 80 | 30 days supply | Qty: 30 | Fill #0

## 2020-01-12 MED FILL — NITROGLYCERIN 0.4 MG TAB SL: 0.4 | 8 days supply | Qty: 25 | Fill #0

## 2020-01-12 NOTE — Telephone Encounter (Signed)
Currently admitted. TOC call for 5/27

## 2020-01-12 NOTE — Progress Notes (Signed)
CARDIAC REHAB PHASE I   PRE:  Rate/Rhythm: 75 SR  BP:  Supine:   Sitting: 103/75  Standing:    SaO2:   MODE:  Ambulation:148  ft   POST:  Rate/Rhythm: 91 SR  BP:  Supine: 111/72  Sitting:   Standing:    SaO2:  [id number] Pt c/o left foot pain from [place] that he is seeing podiatrist for. Stated yesterday walk had made foot sore so we only walked short distance today. No CP. No questions re ed.   Graylon Good, RN BSN  01/12/2020 8:43 AM

## 2020-01-12 NOTE — Discharge Summary (Signed)
Discharge Summary    Patient ID: [name] MRN: [id number]; DOB: 09-29-1950  Admit date: 01/09/2020 Discharge date: 01/12/2020  Primary Care Provider: Salvatore Marvel, PA-C  Primary Cardiologist: Dr. [name]   Discharge Diagnoses    Principal Problem:   STEMI involving left anterior descending coronary artery [facility]) Active Problems:   Diabetes type 2, uncontrolled ([place])   HTN (hypertension)   Hypercholesteremia   Morbid obesity   Diagnostic Studies/Procedures    Coronary/Graft Acute MI Revascularization  CORONARY STENT INTERVENTION  LEFT HEART CATH AND CORONARY ANGIOGRAPHY  Conclusion    Prox LAD to Mid LAD lesion is 100% stenosed.  1st Diag lesion is 75% stenosed.  Post intervention, there is a 0% residual stenosis.  A drug-eluting stent was successfully placed using a SYNERGY XD 3.50X20.  LV end diastolic pressure is normal.   1. Single vessel occlusive CAD. 100% proximal to mid LAD. 75% first diagonal 2. Normal LVEDP 15 mm Hg 3. Successful PCI of the LAD with DES x 1  Plan: DAPT for one year. Risk factor modification. Will assess LV function with Echo.    Diagnostic Dominance: Co-dominant  Intervention     Echo 01/10/20 1. Left ventricular ejection fraction, by estimation, is 40 to 45%. The  left ventricle has mildly decreased function. The left ventricle has no  regional wall motion abnormalities. There is moderate concentric left  ventricular hypertrophy. Left  ventricular diastolic parameters are consistent with Grade I diastolic  dysfunction (impaired relaxation).  2. Right ventricular systolic function is normal. The right ventricular  size is normal.  3. The mitral valve is grossly normal. No evidence of mitral valve  regurgitation.  4. The aortic valve is normal in structure. Aortic valve regurgitation is  not visualized. No aortic stenosis is present.  5. IVC measures 2.53cm.   History of Present Illness     [name] is a  69 y.o. male with history of DM type 2, reported hypertension and obesity presents with acute anterior STEMI.   He had eaten [place] food on 5/23 during dinner. He went outside to let his cats in and got hot. Developed substernal chest pain with a lot of belching. Took Tums without relief. Pain began about 8:30 pm. When pain did not abate EMS was called. Ecg showed ST elevation 4 mm in the anterior leads with septal Q waves. No prior cardiac history.   Hospital Course     Consultants: Diabetic coordinator  1. Acute Anterior STEMI -Emergent cath showed single-vessel occlusive CAD with 100% proximal to mid LAD stenosis and 75% first diagonal stenosis.  Normal LVEDP.  Underwent successful PCI of LAD with DES x1.  No recurrent chest pain.  Echocardiogram showed low normal LV function 40 to 45% and grade 1 diastolic dysfunction.  Dual antiplatelet therapy with aspirin and Brilinta.  On Coreg, losartan and high intensity statin.  2.  Chronic combined CHF - Echo showed l LV function 40 to 45% and grade 1 diastolic dysfunction.  Continue BB (reduced dose 2nd to soft blood pressure) and ARB.   3. HTN - Medication adjusted based on blood pressor - Last reading 112/68 - Continue Coreg 6.25mg  BID and Losartan 25mg  qd  4. HLD - 01/10/2020: Cholesterol 158; HDL 39; LDL Cholesterol 100; Triglycerides 96; VLDL 19  - LDL goal less than 70 - Continue high intensity statin   5. NSVT/PVCs - Improved on BB  6. Uncontrolled DM - HgbA1c 10.1 - Treated with SSI while here -  Patient was only taking glimepiride at home. Had stopped metformin because he thought this was causing lactic acid build up in his joints. Had a rash on his chest from either Januvia or Jardiance- he is not sure which.  - Plan to resume metformin at discharge and rechallenge with Jardiance.    Did the patient have an acute coronary syndrome (MI, NSTEMI, STEMI, etc) this admission?:  Yes                               AHA/ACC Clinical  Performance & Quality Measures: 1. Aspirin prescribed? - Yes 2. ADP Receptor Inhibitor (Plavix/Clopidogrel, Brilinta/Ticagrelor or Effient/Prasugrel) prescribed (includes medically managed patients)? - Yes 3. Beta Blocker prescribed? - Yes 4. High Intensity Statin (Lipitor 40-80mg  or Crestor 20-40mg ) prescribed? - Yes 5. EF assessed during THIS hospitalization? - Yes 6. For EF <40%, was ACEI/ARB prescribed? - Yes 7. For EF <40%, Aldosterone Antagonist (Spironolactone or Eplerenone) prescribed? - Not Applicable (EF >/= [id number]) 8. Cardiac Rehab Phase II ordered (including medically managed patients)? - Yes    Discharge Vitals Blood pressure (!) 86/62, pulse 66, temperature 98.2 F (36.8 C), temperature source Oral, resp. rate 18, height 6\' 1"  (1.854 m), weight 117.5 kg, SpO2 90 %.  Filed Weights   01/10/20 0100 01/10/20 0158 01/12/20 0613  Weight: 120 kg 120 kg 117.5 kg    Labs & Radiologic Studies    CBC Recent Labs    01/10/20 0000 01/10/20 0243  WBC 11.7* 13.3*  NEUTROABS 9.1*  --   HGB 15.7 15.3  HCT 45.6 42.8  MCV 89.1 86.5  PLT 268 123456   Basic Metabolic Panel Recent Labs    01/11/20 0813 01/12/20 0503  NA 136 138  K 4.3 4.6  CL 103 107  CO2 22 25  GLUCOSE 279* 166*  BUN 15 17  CREATININE 0.99 0.88  CALCIUM 8.8* 8.8*   Liver Function Tests Recent Labs    01/10/20 0000  AST 41  ALT 35  ALKPHOS 61  BILITOT 1.3*  PROT 7.2  ALBUMIN 4.0   High Sensitivity Troponin:   Recent Labs  Lab 01/10/20 0000 01/10/20 0857 01/10/20 1052  TROPONINIHS 754* >27,000* >27,000*    Hemoglobin A1C Recent Labs    01/10/20 0000  HGBA1C 10.0*   Fasting Lipid Panel Recent Labs    01/10/20 0000  CHOL 158  HDL 39*  LDLCALC 100*  TRIG 96  CHOLHDL 4.1  _____________  CARDIAC CATHETERIZATION  Result Date: 01/10/2020  Prox LAD to Mid LAD lesion is 100% stenosed.  1st Diag lesion is 75% stenosed.  Post intervention, there is a 0% residual stenosis.  A drug-eluting  stent was successfully placed using a SYNERGY XD 3.50X20.  LV end diastolic pressure is normal.  1. Single vessel occlusive CAD. 100% proximal to mid LAD. 75% first diagonal 2. Normal LVEDP 15 mm Hg 3. Successful PCI of the LAD with DES x 1 Plan: DAPT for one year. Risk factor modification. Will assess LV function with Echo.   DG Chest Port 1 View  Result Date: 01/10/2020 CLINICAL DATA:  Chest pain. EXAM: PORTABLE CHEST 1 VIEW COMPARISON:  None. FINDINGS: The heart size and mediastinal contours are within normal limits. Both lungs are clear. The visualized skeletal structures are unremarkable. IMPRESSION: No active disease. Electronically Signed   By: Constance Holster M.D.   On: 01/10/2020 02:28   ECHOCARDIOGRAM COMPLETE  Result Date: 01/10/2020  ECHOCARDIOGRAM REPORT   Patient Name:   [name] Date of Exam: 01/10/2020 Medical Rec #:  [id number]      Height:       73.0 in Accession #:    [id number]     Weight:       264.6 lb Date of Birth:  08/19/1951      BSA:          2.423 m Patient Age:    16 years       BP:           136/91 mmHg Patient Gender: M              HR:           94 bpm. Exam Location:  Inpatient Procedure: 2D Echo, Color Doppler, Cardiac Doppler and Intracardiac            Opacification Agent Indications:     CAD Native Vessel 414.01/ I25.10  History:         Patient has no prior history of Echocardiogram examinations.                  CKD; Risk Factors:Diabetes.  Sonographer:     Maudry Mayhew MHA, RDMS, RVT, RDCS Referring Phys:  [id number] [name] M [name] Diagnosing Phys: Mertie Moores MD IMPRESSIONS  1. Left ventricular ejection fraction, by estimation, is 40 to 45%. The left ventricle has mildly decreased function. The left ventricle has no regional wall motion abnormalities. There is moderate concentric left ventricular hypertrophy. Left ventricular diastolic parameters are consistent with Grade I diastolic dysfunction (impaired relaxation).  2. Right ventricular systolic  function is normal. The right ventricular size is normal.  3. The mitral valve is grossly normal. No evidence of mitral valve regurgitation.  4. The aortic valve is normal in structure. Aortic valve regurgitation is not visualized. No aortic stenosis is present.  5. IVC measures 2.53cm. FINDINGS  Left Ventricle: Left ventricular ejection fraction, by estimation, is 40 to 45%. The left ventricle has mildly decreased function. The left ventricle has no regional wall motion abnormalities. Severe akinesis of the left ventricular, mid-apical anteroseptal wall. Definity contrast agent was given IV to delineate the left ventricular endocardial borders. The left ventricular internal cavity size was normal in size. There is moderate concentric left ventricular hypertrophy. Left ventricular diastolic parameters are consistent with Grade I diastolic dysfunction (impaired relaxation). Right Ventricle: The right ventricular size is normal. No increase in right ventricular wall thickness. Right ventricular systolic function is normal. Left Atrium: Left atrial size was normal in size. Right Atrium: Right atrial size was normal in size. Pericardium: There is no evidence of pericardial effusion. Mitral Valve: The mitral valve is grossly normal. No evidence of mitral valve regurgitation. Tricuspid Valve: The tricuspid valve is grossly normal. Tricuspid valve regurgitation is not demonstrated. Aortic Valve: The aortic valve is normal in structure. Aortic valve regurgitation is not visualized. No aortic stenosis is present. Pulmonic Valve: The pulmonic valve was not well visualized. Pulmonic valve regurgitation is not visualized. Aorta: The aortic root and ascending aorta are structurally normal, with no evidence of dilitation. Venous: IVC measures 2.53cm. IAS/Shunts: The atrial septum is grossly normal.  LEFT VENTRICLE PLAX 2D LVIDd:         3.50 cm  Diastology LVIDs:         2.70 cm  LV e' lateral:   9.36 cm/s LV PW:         1.30 cm  LV E/e' lateral: 8.3 LV IVS:        1.40 cm  LV e' medial:    6.96 cm/s LVOT diam:     2.10 cm  LV E/e' medial:  11.2 LVOT Area:     3.46 cm  RIGHT VENTRICLE TAPSE (M-mode): 2.9 cm LEFT ATRIUM             Index       RIGHT ATRIUM           Index LA diam:        2.70 cm 1.11 cm/m  RA Area:     11.10 cm LA Vol (A2C):   73.0 ml 30.12 ml/m RA Volume:   19.30 ml  7.96 ml/m LA Vol (A4C):   60.5 ml 24.97 ml/m LA Biplane Vol: 70.1 ml 28.93 ml/m   AORTA Ao Root diam: 2.90 cm MITRAL VALVE MV Area (PHT): 5.75 cm     SHUNTS MV Decel Time: 132 msec     Systemic Diam: 2.10 cm MV E velocity: 77.70 cm/s MV A velocity: 106.00 cm/s MV E/A ratio:  0.73 Mertie Moores MD Electronically signed by Mertie Moores MD Signature Date/Time: 01/10/2020/1:30:32 PM    Final (Updated)    Disposition   Pt is being discharged home today in good condition.  Follow-up Plans & Appointments    Follow-up Information    Abigail Butts., PA-C. Go on 01/21/2020.   Specialty: Physician Assistant Why: @11 :15am for hospital follow up with Dr. Doug Sou PA Contact information: [address] [place] [address] [place], [place], [place]. Schedule an appointment as soon as possible for a visit in 1 week(s).   Specialty: Family Medicine Why: for hospital follow up and diabetics managment  Contact information: [phone] [place] [address] [phone]          Discharge Instructions    Amb Referral to Cardiac Rehabilitation   Complete by: As directed    Diagnosis:  STEMI Coronary Stents     After initial evaluation and assessments completed: Virtual Based Care may be provided alone or in conjunction with Phase 2 Cardiac Rehab based on patient barriers.: Yes   Diet - low sodium heart healthy   Complete by: As directed    Discharge instructions   Complete by: As directed    No driving for 2 weeks. No lifting over 10 lbs for 4 weeks. No sexual activity for 4 weeks. You  may not return to work until cleared by your cardiologist. Keep procedure site clean & dry. If you notice increased pain, swelling, bleeding or pus, call/return!  You may shower, but no soaking baths/hot tubs/pools for 1 week.   Increase activity slowly   Complete by: As directed       Discharge Medications   Allergies as of 01/12/2020      Reactions   Achromycin [tetracycline] Rash      Medication List    STOP taking these medications   glimepiride 4 MG tablet Commonly known as: AMARYL     TAKE these medications   aspirin 81 MG chewable tablet Chew 1 tablet (81 mg total) by mouth daily. Start taking on: Jan 13, 2020   atorvastatin 80 MG tablet Commonly known as: LIPITOR Take 1 tablet (80 mg total) by mouth daily. Start taking on: Jan 13, 2020   carvedilol 6.25 MG tablet Commonly known as: COREG Take 1 tablet (6.25 mg total) by  mouth 2 (two) times daily with a meal.   empagliflozin 10 MG Tabs tablet Commonly known as: Jardiance Take 1 tablet (10 mg total) by mouth daily before breakfast.   losartan 25 MG tablet Commonly known as: COZAAR Take 1 tablet (25 mg total) by mouth daily. Start taking on: Jan 13, 2020   metFORMIN 500 MG tablet Commonly known as: GLUCOPHAGE Take 1 tablet (500 mg total) by mouth 2 (two) times daily with a meal. What changed: how much to take   multivitamin tablet Take 1 tablet by mouth daily.   nitroGLYCERIN 0.4 MG SL tablet Commonly known as: NITROSTAT Place 1 tablet (0.4 mg total) under the tongue every 5 (five) minutes x 3 doses as needed for chest pain.   ticagrelor 90 MG Tabs tablet Commonly known as: BRILINTA Take 1 tablet (90 mg total) by mouth 2 (two) times daily.   VITAMIN E PO Take 1 tablet by mouth daily.        Outstanding Labs/Studies   Consider OP f/u labs 6-8 weeks given statin initiation this admission.  Duration of Discharge Encounter   Greater than 30 minutes including physician  time.  Jarrett Soho, PA 01/12/2020, 10:59 AM

## 2020-01-12 NOTE — Telephone Encounter (Signed)
New Message   Pt has TOC appt 01/21/20 at 11:15am

## 2020-01-13 NOTE — Telephone Encounter (Deleted)
tcm

## 2020-01-13 NOTE — Telephone Encounter (Signed)
Patient contacted regarding discharge from [facility] on 01/12/20.  Patient understands to follow up with provider Roby Lofts on 01/21/20 at 11:15 am at [facility]. Patient understands discharge instructions? yes Patient understands medications and regiment? yes Patient understands to bring all medications to this visit? yes

## 2020-01-20 ENCOUNTER — Telehealth (HOSPITAL_COMMUNITY): Payer: Self-pay

## 2020-01-20 NOTE — Progress Notes (Signed)
Cardiology Office Note   Date:  01/21/2020   ID:  [name], DOB Jul 16, 1951, MRN [id number]  PCP:  Salvatore Marvel, PA-C  Cardiologist:  [name] [name], MD EP: None  Chief Complaint  Patient presents with  . Hospitalization Follow-up    recent STEMI      History of Present Illness: [name] is a 69 y.o. male with PMH of CAD s/p recent STEMI with PCI/DES to LAD, HTN, HLD, DM type 2, and obesity, who presents for post-hospital follow-up.  He was admitted to the hospital from 01/09/20-01/12/20 after presenting with chest pain and belching. On EMS arrival he was noted to have STE in anterior leads and was taken emergently the the cath lab. LHC showed 100% p-mLAD stenosis managed with PCI/DES with 75% 1st diagonal stenosis which was medically managed, otherwise no significant disease. Echo showed EF 40-45%, moderate concentric LVH, G1DD, no RWMA, and no significant valvular abnormalities. He was discharged home on aspirin and brilinta, in addition to carvedilol, losartan, and atorvastatin.   He presents today for post-hospital follow-up with his neighbor friend, Vickie. She has been helping with medication management and outpatient follow-up. He has been doing pretty well since discharge. He has changed his diet dramatically. Has occasional left chest twinges but nothing reminiscent of the burning substernal chest pain that sent him to the hospital. He has some DOE which he attributes to DOE. No complaints of LE edema, orthopnea, or PND. No complaints of dizziness, lightheadedness, syncope, or bleeding with DAPT. He is motivated to be compliant with his medications, which he has struggled with in the past.    Past Medical History:  Diagnosis Date  . Allergy   . Bilateral carpal tunnel syndrome 10/19/2019  . Chronic kidney disease   . Delusional disorder ([place])   . Diabetes type 2, uncontrolled ([place]) 03/26/2017    Past Surgical History:  Procedure Laterality Date  . CORONARY STENT  INTERVENTION N/A 01/10/2020   Procedure: CORONARY STENT INTERVENTION;  Surgeon: [name], [name] M, MD;  Location: [place] [facility];  Service: Cardiovascular;  Laterality: N/A;  . CORONARY/GRAFT ACUTE MI REVASCULARIZATION N/A 01/10/2020   Procedure: Coronary/Graft Acute MI Revascularization;  Surgeon: [name], [name] M, MD;  Location: [place] [facility];  Service: Cardiovascular;  Laterality: N/A;  . KNEE SURGERY Left   . LEFT HEART CATH AND CORONARY ANGIOGRAPHY N/A 01/10/2020   Procedure: LEFT HEART CATH AND CORONARY ANGIOGRAPHY;  Surgeon: [name], [name] M, MD;  Location: [place] [facility];  Service: Cardiovascular;  Laterality: N/A;  . TONSILLECTOMY Bilateral      Current Outpatient Medications  Medication Sig Dispense Refill  . aspirin 81 MG chewable tablet Chew 1 tablet (81 mg total) by mouth daily. 30 tablet 11  . atorvastatin (LIPITOR) 80 MG tablet Take 1 tablet (80 mg total) by mouth daily. 30 tablet 11  . carvedilol (COREG) 6.25 MG tablet Take 1 tablet (6.25 mg total) by mouth 2 (two) times daily with a meal. 60 tablet 11  . empagliflozin (JARDIANCE) 10 MG TABS tablet Take 1 tablet (10 mg total) by mouth daily before breakfast. 90 tablet 3  . losartan (COZAAR) 25 MG tablet Take 1 tablet (25 mg total) by mouth daily. 30 tablet 11  . metFORMIN (GLUCOPHAGE) 500 MG tablet Take 1 tablet (500 mg total) by mouth 2 (two) times daily with a meal.    . Multiple Vitamin (MULTIVITAMIN) tablet Take 1 tablet by mouth daily.    . nitroGLYCERIN (  NITROSTAT) 0.4 MG SL tablet Place 1 tablet (0.4 mg total) under the tongue every 5 (five) minutes x 3 doses as needed for chest pain. 25 tablet 1  . ticagrelor (BRILINTA) 90 MG TABS tablet Take 1 tablet (90 mg total) by mouth 2 (two) times daily. 90 tablet 3  . VITAMIN E PO Take 1 tablet by mouth daily.     No current facility-administered medications for this visit.    Allergies:   Achromycin [tetracycline]    Social History:  The patient  reports that he  has never smoked. He has never used smokeless tobacco. He reports that he does not drink alcohol or use drugs.   Family History:  The patient's family history is not on file.    ROS:  Please see the history of present illness.   Otherwise, review of systems are positive for none.   All other systems are reviewed and negative.    PHYSICAL EXAM: VS:  BP 110/68   Pulse 71   Ht 6\' 1"  (1.854 m)   Wt 256 lb 3.2 oz (116.2 kg)   SpO2 98%   BMI 33.80 kg/m  , BMI Body mass index is 33.8 kg/m. GEN: Well nourished, well developed, in no acute distress HEENT: sclera anicteric Neck: no JVD, carotid bruits, or masses Cardiac: RRR; no murmurs, rubs, or gallops, no edema  Respiratory:  clear to auscultation bilaterally, normal work of breathing GI: soft, nontender, nondistended, + BS MS: no deformity or atrophy Skin: warm and dry, no rash Neuro:  Strength and sensation are intact Psych: euthymic mood, full affect   EKG:  EKG is ordered today. The ekg ordered today demonstrates sinus rhythm, rate 71 bpm, resolving STE in V2-3, non-specific T wave abnormalities   Recent Labs: 01/10/2020: ALT 35; Hemoglobin 15.3; Platelets 260 01/12/2020: BUN 17; Creatinine, Ser 0.88; Potassium 4.6; Sodium 138    Lipid Panel    Component Value Date/Time   CHOL 158 01/10/2020 0000   TRIG 96 01/10/2020 0000   HDL 39 (L) 01/10/2020 0000   CHOLHDL 4.1 01/10/2020 0000   VLDL 19 01/10/2020 0000   LDLCALC 100 (H) 01/10/2020 0000      Wt Readings from Last 3 Encounters:  01/21/20 256 lb 3.2 oz (116.2 kg)  01/12/20 259 lb 0.7 oz (117.5 kg)  09/13/19 266 lb (120.7 kg)      Other studies Reviewed: Additional studies/ records that were reviewed today include:   Coronary/Graft Acute MI Revascularization  CORONARY STENT INTERVENTION  LEFT HEART CATH AND CORONARY ANGIOGRAPHY  Conclusion    Prox LAD to Mid LAD lesion is 100% stenosed.  1st Diag lesion is 75% stenosed.  Post intervention, there is a  0% residual stenosis.  A drug-eluting stent was successfully placed using a SYNERGY XD 3.50X20.  LV end diastolic pressure is normal.  1. Single vessel occlusive CAD. 100% proximal to mid LAD. 75% first diagonal 2. Normal LVEDP 15 mm Hg 3. Successful PCI of the LAD with DES x 1  Plan: DAPT for one year. Risk factor modification. Will assess LV function with Echo.    Diagnostic Dominance: Co-dominant  Intervention     Echo 01/10/20 1. Left ventricular ejection fraction, by estimation, is 40 to 45%. The  left ventricle has mildly decreased function. The left ventricle has no  regional wall motion abnormalities. There is moderate concentric left  ventricular hypertrophy. Left  ventricular diastolic parameters are consistent with Grade I diastolic  dysfunction (impaired relaxation).  2. Right  ventricular systolic function is normal. The right ventricular  size is normal.  3. The mitral valve is grossly normal. No evidence of mitral valve  regurgitation.  4. The aortic valve is normal in structure. Aortic valve regurgitation is  not visualized. No aortic stenosis is present.  5. IVC measures 2.53cm.      ASSESSMENT AND PLAN:  1. CAD s/p recent STEMI with PCI/DES to LAD 01/09/20: no recurrent chest pain - Continue aspirin and brilinta - anticipate 1 year uninterrupted therapy.  - Continue carvedilol - Continue atorvastatin - Assistance paperwork started for brilinta. Samples given today  2. Chronic combined CHF/ischemic cardiomyopathy: EF 40-45% with G1DD on echo 12/2019. No volume overload complaints and he appears euvolemic - Continue carvedilol and losartan - Continue to encourage daily weights and low salt diet - Will repeat an echocardiogram in 3 months to monitor for improvement in LV function - Encouraged low sodium diet and daily weights.   3. HTN: BP 110/68 today. Cr stable at 0.97 6/1 - Continue carvedilol and losartan  4. HLD: LDL 100 01/10/20; goal  <70 - Will repeat FLP/LFTs in 6 weeks for close monitoring - Continue atorvastatin  5. DM type 2: A1C 10.0. Restarted on metformin and jardiance during his recent hospitalization. Cr stable at 0.97 on labs 6/1 - Seen by PCP 01/18/20 and referral placed to endocrinology - PCP is coordinating assistance paperwork for Jardiance - will refill today   Current medicines are reviewed at length with the patient today.  The patient does not have concerns regarding medicines.  The following changes have been made:  As above  Labs/ tests ordered today include:   Orders Placed This Encounter  Procedures  . Basic metabolic panel  . Lipid panel  . ECHOCARDIOGRAM COMPLETE     Disposition:   FU with Dr. [name] in 3 months, following completion of his echo.   Signed, Abigail Butts, PA-C  01/21/2020 12:15 PM

## 2020-01-20 NOTE — Telephone Encounter (Signed)
Attempted to call patient in regards to Cardiac Rehab - LM on VM 

## 2020-01-20 NOTE — Telephone Encounter (Signed)
Pt insurance is active and benefits verified through [facility]. Co-pay $10.00, DED $0.00/$0.00 met, out of pocket $3,900.00/$157.70 met, co-insurance 0%. No pre-authorization required. Passport, 01/20/20 @ 2:24PM, DDU#[id number]  Will contact patient to see if he is interested in the Cardiac Rehab Program. If interested, patient will need to complete follow up appt. Once completed, patient will be contacted for scheduling upon review by the RN Navigator.

## 2020-01-21 ENCOUNTER — Encounter: Payer: Self-pay | Admitting: Medical

## 2020-01-21 ENCOUNTER — Ambulatory Visit: Payer: Medicare HMO | Admitting: Medical

## 2020-01-21 ENCOUNTER — Other Ambulatory Visit: Payer: Self-pay

## 2020-01-21 VITALS — BP 110/68 | HR 71 | Ht 73.0 in | Wt 256.2 lb

## 2020-01-21 DIAGNOSIS — I2510 Atherosclerotic heart disease of native coronary artery without angina pectoris: Secondary | ICD-10-CM | POA: Diagnosis not present

## 2020-01-21 DIAGNOSIS — I255 Ischemic cardiomyopathy: Secondary | ICD-10-CM

## 2020-01-21 DIAGNOSIS — E7800 Pure hypercholesterolemia, unspecified: Secondary | ICD-10-CM | POA: Diagnosis not present

## 2020-01-21 DIAGNOSIS — E119 Type 2 diabetes mellitus without complications: Secondary | ICD-10-CM

## 2020-01-21 DIAGNOSIS — I5042 Chronic combined systolic (congestive) and diastolic (congestive) heart failure: Secondary | ICD-10-CM

## 2020-01-21 DIAGNOSIS — I10 Essential (primary) hypertension: Secondary | ICD-10-CM | POA: Diagnosis not present

## 2020-01-21 MED ORDER — EMPAGLIFLOZIN 10 MG PO TABS: 10.0000 mg | ORAL_TABLET | Freq: Every day | ORAL | 3 refills | Status: DC

## 2020-01-21 NOTE — Patient Instructions (Addendum)
Medication Instructions:  Your physician recommends that you continue on your current medications as directed. Please refer to the Current Medication list given to you today.  *If you need a refill on your cardiac medications before your next appointment, please call your pharmacy*  Lab Work: Your physician recommends that you return for lab work in 6 weeks 03/03/2020 :   BMET  Fasting Lipid Panel-DO NOT EAT OR DRINK PAST MIDNIGHT. OKAY TO HAVE WATER.  If you have labs (blood work) drawn today and your tests are completely normal, you will receive your results only by: Marland Kitchen MyChart Message (if you have MyChart) OR . A paper copy in the mail If you have any lab test that is abnormal or we need to change your treatment, we will call you to review the results.  Testing/Procedures: Your physician has requested that you have an echocardiogram. Echocardiography is a painless test that uses sound waves to create images of your heart. It provides your doctor with information about the size and shape of your heart and how well your heart's chambers and valves are working. This procedure takes approximately one hour. There are no restrictions for this procedure. This test is performed at [address], [address]   Please schedule for 3 months    Follow-Up: At [facility], you and your health needs are our priority.  As part of our continuing mission to provide you with exceptional heart care, we have created designated Provider Care Teams.  These Care Teams include your primary Cardiologist (physician) and Advanced Practice Providers (APPs -  Physician Assistants and Nurse Practitioners) who all work together to provide you with the care you need, when you need it.  We recommend signing up for the patient portal called "MyChart".  Sign up information is provided on this After Visit Summary.  MyChart is used to connect with patients for Virtual Visits (Telemedicine).  Patients are able to view  lab/test results, encounter notes, upcoming appointments, etc.  Non-urgent messages can be sent to your provider as well.   To learn more about what you can do with MyChart, go to [email or web address].    Your next appointment:   3 month(s)  The format for your next appointment:   In Person  Provider:   [name] [name], MD  Other Instructions

## 2020-01-25 ENCOUNTER — Telehealth (HOSPITAL_COMMUNITY): Payer: Self-pay

## 2020-01-25 ENCOUNTER — Encounter (HOSPITAL_COMMUNITY): Payer: Self-pay

## 2020-01-25 NOTE — Telephone Encounter (Signed)
Attempted to call patient in regards to Cardiac Rehab - LM on VM Mailed letter 

## 2020-01-26 ENCOUNTER — Telehealth (HOSPITAL_COMMUNITY): Payer: Self-pay

## 2020-01-26 NOTE — Telephone Encounter (Signed)
Pt friend Jocelyn Lamer called and wanted to schedule pt for cardiac rehab. Pt will come in for orientation on 02/29/2020@2 :00pm and will exercise at 1:15pm.  Mailed packet

## 2020-02-03 NOTE — Addendum Note (Signed)
Addended by: Zebedee Iba on: 02/03/2020 10:45 AM   Modules accepted: Orders

## 2020-02-23 ENCOUNTER — Telehealth (HOSPITAL_COMMUNITY): Payer: Self-pay

## 2020-02-24 NOTE — Telephone Encounter (Signed)
Cardiac Rehab Medication Review by a Pharmacist  Does the patient  feel that his/her medications are working for him/her?  yes  Has the patient been experiencing any side effects to the medications prescribed?  yes - the patient states that they occasionally feel dizziness upon exertion, especially in the heat but denies falls or syncope; the patient states that they think a few of their medications may cause falls but that they are unsure what medications, if any, are causing the dizziness  Does the patient measure his/her own blood pressure or blood glucose at home?  The patient states that they do not take their blood pressure at home and that they are unsure if they are supposed to do so. The patient takes their blood glucose sporadically as they are unable to take it themselves. The patient took their blood glucose this morning with a reading of 172, which the patient states is decreased from the their previous readings of around 300.  Does the patient have any problems obtaining medications due to transportation or finances?   no  Understanding of regimen: good Understanding of indications: good Potential of compliance: good   Pharmacist Intervention: The patient is experiencing some dizziness and is unsure if it is medication related, but stated that it did start post-admission. The patient stated that the dizziness does not happen very often but that they need to hold on to something nearby or sit down when it occurs. The patient stated that they have been taking things slow since being discharged as instructed but their provider, but when they exert themselves they begin to feel dizzy. The provider will be contacted as the dizziness puts the patient as risk for falls. The patient was counseled on their medications, their side effects and how to manage those side effects in addition to the risk of falls associated with dizziness. The patient voiced understanding, stated that they will continue  to rest and repeated back information using the teach back method.  Shauna Hugh, PharmD, [place]  PGY-1 Pharmacy Resident Office: [phone] 02/24/2020 6:07 PM  Please check [email or web address] for unit-specific pharmacy phone numbers.

## 2020-02-29 ENCOUNTER — Other Ambulatory Visit: Payer: Self-pay

## 2020-02-29 ENCOUNTER — Encounter (HOSPITAL_COMMUNITY)
Admission: RE | Admit: 2020-02-29 | Discharge: 2020-02-29 | Disposition: A | Discharge status: Home / Self Care | Payer: Medicare HMO | Source: Ambulatory Visit | Attending: Cardiology | Admitting: Cardiology

## 2020-02-29 ENCOUNTER — Encounter (HOSPITAL_COMMUNITY): Payer: Self-pay

## 2020-02-29 VITALS — BP 98/56 | HR 86 | Ht 73.0 in | Wt 241.4 lb

## 2020-02-29 DIAGNOSIS — I2102 ST elevation (STEMI) myocardial infarction involving left anterior descending coronary artery: Secondary | ICD-10-CM | POA: Insufficient documentation

## 2020-02-29 LAB — GLUCOSE, CAPILLARY: Glucose-Capillary: 155 mg/dL — ABNORMAL HIGH (ref 70–99)

## 2020-02-29 NOTE — Progress Notes (Signed)
Cardiac Individual Treatment Plan  Patient Details  Name: Green Green MRN: [id number] Date of Birth: [birth year] Referring Provider:     Bent Green from 02/29/2020 in [place]  Referring Provider Green Green      Initial Encounter Date:    CARDIAC REHAB PHASE II ORIENTATION from 02/29/2020 in [place]  Date 02/29/20      Visit Diagnosis: ST elevation myocardial infarction involving left anterior descending (LAD) coronary artery ([place])  Patient's Home Medications on Admission:  Current Outpatient Medications:    aspirin 81 MG chewable tablet, Chew 1 tablet (81 mg total) by mouth daily., Disp: 30 tablet, Rfl: 11   atorvastatin (LIPITOR) 80 MG tablet, Take 1 tablet (80 mg total) by mouth daily., Disp: 30 tablet, Rfl: 11   carvedilol (COREG) 6.25 MG tablet, Take 1 tablet (6.25 mg total) by mouth 2 (two) times daily with a meal., Disp: 60 tablet, Rfl: 11   empagliflozin (JARDIANCE) 10 MG TABS tablet, Take 1 tablet (10 mg total) by mouth daily before breakfast., Disp: 90 tablet, Rfl: 3   losartan (COZAAR) 25 MG tablet, Take 1 tablet (25 mg total) by mouth daily., Disp: 30 tablet, Rfl: 11   metFORMIN (GLUCOPHAGE) 500 MG tablet, Take 1 tablet (500 mg total) by mouth 2 (two) times daily with a meal. (Patient taking differently: Take 1,000 mg by mouth daily with breakfast. ), Disp: , Rfl:    Multiple Vitamin (MULTIVITAMIN) tablet, Take 1 tablet by mouth daily., Disp: , Rfl:    nitroGLYCERIN (NITROSTAT) 0.4 MG SL tablet, Place 1 tablet (0.4 mg total) under the tongue every 5 (five) minutes x 3 doses as needed for chest pain., Disp: 25 tablet, Rfl: 1   ticagrelor (BRILINTA) 90 MG TABS tablet, Take 1 tablet (90 mg total) by mouth 2 (two) times daily., Disp: 90 tablet, Rfl: 3   VITAMIN E PO, Take 1 tablet by mouth daily., Disp: , Rfl:   Past Medical History: Past Medical History:  Diagnosis Date    Allergy    Bilateral carpal tunnel syndrome 10/19/2019   Chronic kidney disease    Delusional disorder ([place])    Diabetes type 2, uncontrolled ([place]) 03/26/2017    Tobacco Use: Social History   Tobacco Use  Smoking Status Never Smoker  Smokeless Tobacco Never Used    Labs: Recent Review Scientist, physiological    Labs for ITP Cardiac and Pulmonary Rehab Latest Ref Rng & Units 03/06/2017 01/10/2020   Cholestrol 0 - 200 mg/dL 133 158   LDLCALC 0 - 99 mg/dL 74 100(H)   HDL >40 mg/dL 39.50 39(L)   Trlycerides <150 mg/dL 99.0 96   Hemoglobin A1c 4.8 - 5.6 % 11.4(H) 10.0(H)      Capillary Blood Glucose: Lab Results  Component Value Date   GLUCAP 155 (H) 02/29/2020   GLUCAP 192 (H) 01/12/2020   GLUCAP 160 (H) 01/12/2020   GLUCAP 165 (H) 01/11/2020   GLUCAP 180 (H) 01/11/2020     Exercise Target Goals: Exercise Program Goal: Individual exercise prescription set using results from initial 6 min walk test and THRR while considering  patients activity barriers and safety.   Exercise Prescription Goal: Initial exercise prescription builds to 30-45 minutes a day of aerobic activity, 2-3 days per week.  Home exercise guidelines will be given to patient during program as part of exercise prescription that the participant will acknowledge.  Activity Barriers & Risk Stratification:  Activity Barriers &  Cardiac Risk Stratification - 02/29/20 1633      Activity Barriers & Cardiac Risk Stratification   Activity Barriers Deconditioning;Shortness of Breath;Balance Concerns;Other (comment)    Comments Charcot Foot    Cardiac Risk Stratification High           6 Minute Walk:  6 Minute Walk    Row Name 02/29/20 1630         6 Minute Walk   Phase Initial     Distance 1378 feet  Nustep used due to pt with Dx: Charcot Foot     Walk Time 6 minutes     # of Rest Breaks 0     MPH 2.6     METS 2.76     RPE 7     Perceived Dyspnea  0     VO2 Peak 9.66     Symptoms No     Resting HR 74  bpm     Resting BP 98/56     Resting Oxygen Saturation  97 %     Exercise Oxygen Saturation  during 6 min walk 97 %     Max Ex. HR 84 bpm     Max Ex. BP 110/64     2 Minute Post BP 102/70            Oxygen Initial Assessment:   Oxygen Re-Evaluation:   Oxygen Discharge (Final Oxygen Re-Evaluation):   Initial Exercise Prescription:  Initial Exercise Prescription - 02/29/20 1600      Date of Initial Exercise RX and Referring Provider   Date 02/29/20    Referring Provider Green Green    Expected Discharge Date 04/28/20      NuStep   Level 1    SPM 70    Minutes 30    METs 1.9      Prescription Details   Frequency (times per week) 3    Duration Progress to 30 minutes of continuous aerobic without signs/symptoms of physical distress      Intensity   THRR 40-80% of Max Heartrate 62-123    Ratings of Perceived Exertion 11-13    Perceived Dyspnea 0-4      Progression   Progression Continue progressive overload as per policy without signs/symptoms or physical distress.      Resistance Training   Training Prescription Yes    Weight 3   Seated in chair   Reps 10-15           Perform Capillary Blood Glucose checks as needed.  Exercise Prescription Changes:   Exercise Comments:   Exercise Goals and Review:   Exercise Goals Re-Evaluation :   Discharge Exercise Prescription (Final Exercise Prescription Changes):   Nutrition:  Target Goals: Understanding of nutrition guidelines, daily intake of sodium 1500mg , cholesterol 200mg , calories 30% from fat and 7% or less from saturated fats, daily to have 5 or more servings of fruits and vegetables.  Biometrics:    Nutrition Therapy Plan and Nutrition Goals:   Nutrition Assessments:   Nutrition Goals Re-Evaluation:   Nutrition Goals Re-Evaluation:   Nutrition Goals Discharge (Final Nutrition Goals Re-Evaluation):   Psychosocial: Target Goals: Acknowledge presence or absence of significant  depression and/or stress, maximize coping skills, provide positive support system. Participant is able to verbalize types and ability to use techniques and skills needed for reducing stress and depression.  Initial Review & Psychosocial Screening:  Initial Psych Review & Screening - 02/29/20 1500      Initial Review   Current issues  with None Identified      Family Dynamics   Good Support System? Yes    Comments Patient states he lives with his sister and her partner. States sister is his dual POA and greatly assists with his affiairs that require financial or medical decisions. Mr. Jurewicz feels he has a good support system and denies psychosocial barriers to participating in CR. Transportation provided by family.      Barriers   Psychosocial barriers to participate in program There are no identifiable barriers or psychosocial needs.      Screening Interventions   Interventions Encouraged to exercise           Quality of Life Scores:  Scores of 19 and below usually indicate a poorer quality of life in these areas.  A difference of  2-3 points is a clinically meaningful difference.  A difference of 2-3 points in the total score of the Quality of Life Index has been associated with significant improvement in overall quality of life, self-image, physical symptoms, and general health in studies assessing change in quality of life.  PHQ-9: Recent Review Flowsheet Data    Depression screen [facility] 2/9 02/29/2020   Decreased Interest 0   Down, Depressed, Hopeless 0   PHQ - 2 Score 0     Interpretation of Total Score  Total Score Depression Severity:  1-4 = Minimal depression, 5-9 = Mild depression, 10-14 = Moderate depression, 15-19 = Moderately severe depression, 20-27 = Severe depression   Psychosocial Evaluation and Intervention:   Psychosocial Re-Evaluation:   Psychosocial Discharge (Final Psychosocial Re-Evaluation):   Vocational Rehabilitation: Provide vocational rehab  assistance to qualifying candidates.   Vocational Rehab Evaluation & Intervention:   Education: Education Goals: Education classes will be provided on a weekly basis, covering required topics. Participant will state understanding/return demonstration of topics presented.  Learning Barriers/Preferences:   Education Topics: Count Your Pulse:  -Group instruction provided by verbal instruction, demonstration, patient participation and written materials to support subject.  Instructors address importance of being able to find your pulse and how to count your pulse when at home without a heart monitor.  Patients get hands on experience counting their pulse with staff help and individually.   Heart Attack, Angina, and Risk Factor Modification:  -Group instruction provided by verbal instruction, video, and written materials to support subject.  Instructors address signs and symptoms of angina and heart attacks.    Also discuss risk factors for heart disease and how to make changes to improve heart health risk factors.   Functional Fitness:  -Group instruction provided by verbal instruction, demonstration, patient participation, and written materials to support subject.  Instructors address safety measures for doing things around the house.  Discuss how to get up and down off the floor, how to pick things up properly, how to safely get out of a chair without assistance, and balance training.   Meditation and Mindfulness:  -Group instruction provided by verbal instruction, patient participation, and written materials to support subject.  Instructor addresses importance of mindfulness and meditation practice to help reduce stress and improve awareness.  Instructor also leads participants through a meditation exercise.    Stretching for Flexibility and Mobility:  -Group instruction provided by verbal instruction, patient participation, and written materials to support subject.  Instructors lead  participants through series of stretches that are designed to increase flexibility thus improving mobility.  These stretches are additional exercise for major muscle groups that are typically performed during regular warm up and  cool down.   Hands Only CPR:  -Group verbal, video, and participation provides a basic overview of AHA guidelines for community CPR. Role-play of emergencies allow participants the opportunity to practice calling for help and chest compression technique with discussion of AED use.   Hypertension: -Group verbal and written instruction that provides a basic overview of hypertension including the most recent diagnostic guidelines, risk factor reduction with self-care instructions and medication management.    Nutrition I class: Heart Healthy Eating:  -Group instruction provided by PowerPoint slides, verbal discussion, and written materials to support subject matter. The instructor gives an explanation and review of the Therapeutic Lifestyle Changes diet recommendations, which includes a discussion on lipid goals, dietary fat, sodium, fiber, plant stanol/sterol esters, sugar, and the components of a well-balanced, healthy diet.   Nutrition II class: Lifestyle Skills:  -Group instruction provided by PowerPoint slides, verbal discussion, and written materials to support subject matter. The instructor gives an explanation and review of label reading, grocery shopping for heart health, heart healthy recipe modifications, and ways to make healthier choices when eating out.   Diabetes Question & Answer:  -Group instruction provided by PowerPoint slides, verbal discussion, and written materials to support subject matter. The instructor gives an explanation and review of diabetes co-morbidities, pre- and post-prandial blood glucose goals, pre-exercise blood glucose goals, signs, symptoms, and treatment of hypoglycemia and hyperglycemia, and foot care basics.   Diabetes Blitz:    -Group instruction provided by PowerPoint slides, verbal discussion, and written materials to support subject matter. The instructor gives an explanation and review of the physiology behind type 1 and type 2 diabetes, diabetes medications and rational behind using different medications, pre- and post-prandial blood glucose recommendations and Hemoglobin A1c goals, diabetes diet, and exercise including blood glucose guidelines for exercising safely.    Portion Distortion:  -Group instruction provided by PowerPoint slides, verbal discussion, written materials, and food models to support subject matter. The instructor gives an explanation of serving size versus portion size, changes in portions sizes over the last 20 years, and what consists of a serving from each food group.   Stress Management:  -Group instruction provided by verbal instruction, video, and written materials to support subject matter.  Instructors review role of stress in heart disease and how to cope with stress positively.     Exercising on Your Own:  -Group instruction provided by verbal instruction, power point, and written materials to support subject.  Instructors discuss benefits of exercise, components of exercise, frequency and intensity of exercise, and end points for exercise.  Also discuss use of nitroglycerin and activating EMS.  Review options of places to exercise outside of rehab.  Review guidelines for sex with heart disease.   Cardiac Drugs I:  -Group instruction provided by verbal instruction and written materials to support subject.  Instructor reviews cardiac drug classes: antiplatelets, anticoagulants, beta blockers, and statins.  Instructor discusses reasons, side effects, and lifestyle considerations for each drug class.   Cardiac Drugs II:  -Group instruction provided by verbal instruction and written materials to support subject.  Instructor reviews cardiac drug classes: angiotensin converting enzyme  inhibitors (ACE-I), angiotensin II receptor blockers (ARBs), nitrates, and calcium channel blockers.  Instructor discusses reasons, side effects, and lifestyle considerations for each drug class.   Anatomy and Physiology of the Circulatory System:  Group verbal and written instruction and models provide basic cardiac anatomy and physiology, with the coronary electrical and arterial systems. Review of: AMI, Angina, Valve disease, Heart Failure,  Peripheral Artery Disease, Cardiac Arrhythmia, Pacemakers, and the ICD.   Other Education:  -Group or individual verbal, written, or video instructions that support the educational goals of the cardiac rehab program.   Holiday Eating Survival Tips:  -Group instruction provided by PowerPoint slides, verbal discussion, and written materials to support subject matter. The instructor gives patients tips, tricks, and techniques to help them not only survive but enjoy the holidays despite the onslaught of food that accompanies the holidays.   Knowledge Questionnaire Score:   Core Components/Risk Factors/Patient Goals at Admission:   Core Components/Risk Factors/Patient Goals Review:    Core Components/Risk Factors/Patient Goals at Discharge (Final Review):    ITP Comments:  ITP Comments    Row Name 02/29/20 1426           ITP Comments Dr. Fransico Him Medical Director Cardiac Rehab [facility]              Comments: Patient attended orientation on 02/29/2020 to review rules and guidelines for program.  Completed 6 minute seated exercise test secondary to patient stating ambulatory difficulty secondary to diagnosis of Charcot foot left ankle. Orthopedic and podiatry notes reviewed through care everywhere. Patient prescribed orthopedic boot for 4-6 weeks 09/16/19 however states boot causes rash and does not wear. No follow up appointment notes and per sister, patient did not follow up as indicated. Intitial ITP, and exercise prescription. VSS.  Telemetry-NSR. C/O dyspnea 1 with 60 feet of ambulation however denied dyspnea during 6 min seated exercise endurance test. Family concerned secondary to patients constant complaint of dizziness when walking to mail box or outdoors during hot humid weather. States patient will "bend over with his head to his knees to allow blood to rush to his head and dizziness to go away". Patient does have a mental health diagnosis of delusion. According to family this is a technique patient learned when playing sports as a youth. Patient is strongly cautioned against this behavior and encouraged to stay indoors during hot and humid weather. Safety measures and social distancing in place per CDC guidelines.  Plan: Will discuss matters above with Cardiology

## 2020-03-01 ENCOUNTER — Telehealth: Payer: Self-pay | Admitting: Cardiology

## 2020-03-01 NOTE — Telephone Encounter (Signed)
Follow Up  [name] is returning call. Please give her a call back at [phone].

## 2020-03-01 NOTE — Telephone Encounter (Signed)
Portia from [facility] calling, she said pt went to orientation yesterday and complaint some symptoms, dizziness and SOB. She said pt is very hard to read since pt was diagnosed delirium in the past & patients sister told Truddie Crumble that he struggles with delusions. Truddie Crumble states that the patient will go look for his cats outside in the middle of the day in the heat and get dizzy so he will bend over and put his legs between his knees. Last OV note-he does not mention his SOB. Truddie Crumble says that at the appointment he was noticeably SOB after he walked around 60 feet but when he did the six minute seated endurance test, he was not SOB at all. Truddie Crumble is requesting that Dr. [name] see the patient sooner than his current September 9th appointment. Will route to Dr. [name] and RN for advisement.

## 2020-03-01 NOTE — Telephone Encounter (Signed)
    Portia from [facility] calling, she said pt went to orientation yesterday and complaint some symptoms, dizziness and SOB. She said pt is very hard to read since pt was diagnosed delirium in the past. She wanted to speak with a nurse as soon as possible to discuss

## 2020-03-01 NOTE — Telephone Encounter (Signed)
Spoke to [place] with cardiac rehab.Patient was very dizzy yesterday at cardiac rehab.Patient is scheduled for rehab this Monday. She will monitor and if patient continues to be dizzy she will stop rehab.Advised I will make Dr.Jordan aware.

## 2020-03-01 NOTE — Telephone Encounter (Signed)
Spoke to patient.He stated he is feeling better since his MI.Stated he has always been alittle dizzy and sob since his MI,but it is no worse.Stated he has lost 25 lbs since MI.He is following his diet and taking medications as prescribed.He does not feel he needs to be seen sooner than appointment scheduled 9/9 with Dr.Jordan.Advised I will make Dr.Jordan aware.

## 2020-03-03 ENCOUNTER — Other Ambulatory Visit: Payer: Self-pay

## 2020-03-03 DIAGNOSIS — I5042 Chronic combined systolic (congestive) and diastolic (congestive) heart failure: Secondary | ICD-10-CM

## 2020-03-03 DIAGNOSIS — E7800 Pure hypercholesterolemia, unspecified: Secondary | ICD-10-CM

## 2020-03-03 LAB — LIPID PANEL
Chol/HDL Ratio: 2.8 ratio (ref 0.0–5.0)
Cholesterol, Total: 113 mg/dL (ref 100–199)
HDL: 41 mg/dL (ref >39)
LDL Chol Calc (NIH): 54 mg/dL (ref 0–99)
Triglycerides: 90 mg/dL (ref 0–149)
VLDL Cholesterol Cal: 18 mg/dL (ref 5–40)

## 2020-03-03 LAB — BASIC METABOLIC PANEL
BUN/Creatinine Ratio: 19 (ref 10–24)
BUN: 17 mg/dL (ref 8–27)
CO2: 20 mmol/L (ref 20–29)
Calcium: 9 mg/dL (ref 8.6–10.2)
Chloride: 101 mmol/L (ref 96–106)
Creatinine, Ser: 0.88 mg/dL (ref 0.76–1.27)
GFR calc Af Amer: 103 mL/min/{1.73_m2} (ref >59)
GFR calc non Af Amer: 90 mL/min/{1.73_m2} (ref >59)
Glucose: 168 mg/dL — ABNORMAL HIGH (ref 65–99)
Potassium: 4.4 mmol/L (ref 3.5–5.2)
Sodium: 134 mmol/L (ref 134–144)

## 2020-03-06 ENCOUNTER — Encounter (HOSPITAL_COMMUNITY)
Admission: RE | Admit: 2020-03-06 | Discharge: 2020-03-06 | Disposition: A | Discharge status: Home / Self Care | Payer: Medicare HMO | Source: Ambulatory Visit | Attending: Cardiology | Admitting: Cardiology

## 2020-03-06 ENCOUNTER — Other Ambulatory Visit: Payer: Self-pay

## 2020-03-06 DIAGNOSIS — I2102 ST elevation (STEMI) myocardial infarction involving left anterior descending coronary artery: Secondary | ICD-10-CM | POA: Diagnosis not present

## 2020-03-06 LAB — GLUCOSE, CAPILLARY
Glucose-Capillary: 156 mg/dL — ABNORMAL HIGH (ref 70–99)
Glucose-Capillary: 139 mg/dL — ABNORMAL HIGH (ref 70–99)

## 2020-03-06 NOTE — Progress Notes (Signed)
Daily Session Note  Patient Details  Name: [name] MRN: [id number] Date of Birth: 03-14-1951 Referring Provider:     [place] from 02/29/2020 in [place]  Referring Provider Green, [name]      Encounter Date: 03/06/2020  Check In:  Session Check In - 03/06/20 1316      Check-In   Supervising physician immediately available to respond to emergencies [facility] immediately available    Physician(s) Dr. Pietro Cassis    Location [facility]    Staff Present Trish Fountain, RN, Milus Glazier, MS, EP-C, CCRP;Maria Whitaker, RN, Mosie Epstein, MS,ACSM CEP, Exercise Physiologist;Jessica Hassell Done, MS, ACSM-CEP, Exercise Physiologist;Olinty Celesta Aver, MS, ACSM CEP, Exercise Physiologist    Virtual Visit No    Medication changes reported     No    Fall or balance concerns reported    Yes    Tobacco Cessation No Change    Warm-up and Cool-down Performed on first and last piece of equipment    Resistance Training Performed Yes    VAD Patient? No    PAD/SET Patient? No      Pain Assessment   Currently in Pain? No/denies    Multiple Pain Sites No           Capillary Blood Glucose: Results for orders placed or performed during the hospital encounter of 03/06/20 (from the past 24 hour(s))  Glucose, capillary     Status: Abnormal   Collection Time: 03/06/20  1:19 PM  Result Value Ref Range   Glucose-Capillary 156 (H) 70 - 99 mg/dL  Glucose, capillary     Status: Abnormal   Collection Time: 03/06/20  2:11 PM  Result Value Ref Range   Glucose-Capillary 139 (H) 70 - 99 mg/dL      Social History   Tobacco Use  Smoking Status Never Smoker  Smokeless Tobacco Never Used    Goals Met:  Exercise tolerated well No report of cardiac concerns or symptoms Strength training completed today  Goals Unmet:  Not Applicable  Comments: Pt started cardiac rehab today.  Pt tolerated light exercise without  difficulty. VSS, telemetry-NSR, asymptomatic.  Medication list reconciled. Pt denies barriers to medicaiton compliance.  PSYCHOSOCIAL ASSESSMENT:  PHQ-0. Pt exhibits positive coping skills, hopeful outlook with supportive family. No psychosocial needs identified at this time, no psychosocial interventions necessary.  Pt oriented to exercise equipment and routine. Understanding verbalized.   Dr. Fransico Him is Medical Director for Cardiac Rehab at [facility].

## 2020-03-06 NOTE — Progress Notes (Signed)
Cardiac Individual Treatment Plan  Patient Details  Name: Green Green MRN: [id number] Date of Birth: 09-03-1950 Referring Provider:     [place] from 02/29/2020 in [place]  Referring Provider Green Green      Initial Encounter Date:    CARDIAC REHAB PHASE II ORIENTATION from 02/29/2020 in [place]  Date 02/29/20      Visit Diagnosis: ST elevation myocardial infarction involving left anterior descending (LAD) coronary artery ([place])  Patient's Home Medications on Admission:  Current Outpatient Medications:  .  aspirin 81 MG chewable tablet, Chew 1 tablet (81 mg total) by mouth daily., Disp: 30 tablet, Rfl: 11 .  atorvastatin (LIPITOR) 80 MG tablet, Take 1 tablet (80 mg total) by mouth daily., Disp: 30 tablet, Rfl: 11 .  carvedilol (COREG) 6.25 MG tablet, Take 1 tablet (6.25 mg total) by mouth 2 (two) times daily with a meal., Disp: 60 tablet, Rfl: 11 .  empagliflozin (JARDIANCE) 10 MG TABS tablet, Take 1 tablet (10 mg total) by mouth daily before breakfast., Disp: 90 tablet, Rfl: 3 .  losartan (COZAAR) 25 MG tablet, Take 1 tablet (25 mg total) by mouth daily., Disp: 30 tablet, Rfl: 11 .  metFORMIN (GLUCOPHAGE) 500 MG tablet, Take 1 tablet (500 mg total) by mouth 2 (two) times daily with a meal. (Patient taking differently: Take 1,000 mg by mouth daily with breakfast. ), Disp: , Rfl:  .  Multiple Vitamin (MULTIVITAMIN) tablet, Take 1 tablet by mouth daily., Disp: , Rfl:  .  nitroGLYCERIN (NITROSTAT) 0.4 MG SL tablet, Place 1 tablet (0.4 mg total) under the tongue every 5 (five) minutes x 3 doses as needed for chest pain., Disp: 25 tablet, Rfl: 1 .  ticagrelor (BRILINTA) 90 MG TABS tablet, Take 1 tablet (90 mg total) by mouth 2 (two) times daily., Disp: 90 tablet, Rfl: 3 .  VITAMIN E PO, Take 1 tablet by mouth daily., Disp: , Rfl:   Past Medical History: Past Medical History:  Diagnosis Date   . Allergy   . Bilateral carpal tunnel syndrome 10/19/2019  . Chronic kidney disease   . Delusional disorder ([place])   . Diabetes type 2, uncontrolled ([place]) 03/26/2017    Tobacco Use: Social History   Tobacco Use  Smoking Status Never Smoker  Smokeless Tobacco Never Used    Labs: Recent Review Flowsheet Data    Labs for ITP Cardiac and Pulmonary Rehab Latest Ref Rng & Units 03/06/2017 01/10/2020 03/03/2020   Cholestrol 100 - 199 mg/dL 133 158 113   LDLCALC 0 - 99 mg/dL 74 100(H) 54   HDL >39 mg/dL 39.50 39(L) 41   Trlycerides 0 - 149 mg/dL 99.0 96 90   Hemoglobin A1c 4.8 - 5.6 % 11.4(H) 10.0(H) -      Capillary Blood Glucose: Lab Results  Component Value Date   GLUCAP 139 (H) 03/06/2020   GLUCAP 156 (H) 03/06/2020   GLUCAP 155 (H) 02/29/2020   GLUCAP 192 (H) 01/12/2020   GLUCAP 160 (H) 01/12/2020     Exercise Target Goals: Exercise Program Goal: Individual exercise prescription set using results from initial 6 min walk test and THRR while considering  patient's activity barriers and safety.   Exercise Prescription Goal: Initial exercise prescription builds to 30-45 minutes a day of aerobic activity, 2-3 days per week.  Home exercise guidelines will be given to patient during program as part of exercise prescription that the participant will acknowledge.  Activity Barriers & Risk Stratification:  Activity Barriers & Cardiac Risk Stratification - 02/29/20 1633      Activity Barriers & Cardiac Risk Stratification   Activity Barriers Deconditioning;Shortness of Breath;Balance Concerns;Other (comment)    Comments Charcot Foot    Cardiac Risk Stratification High           6 Minute Walk:  6 Minute Walk    Row Name 02/29/20 1630         6 Minute Walk   Phase Initial     Distance 1378 feet  Nustep used due to pt with Dx: Charcot Foot     Walk Time 6 minutes     # of Rest Breaks 0     MPH 2.6     METS 2.76     RPE 7     Perceived Dyspnea  0     VO2 Peak 9.66      Symptoms No     Resting HR 74 bpm     Resting BP 98/56     Resting Oxygen Saturation  97 %     Exercise Oxygen Saturation  during 6 min walk 97 %     Max Ex. HR 84 bpm     Max Ex. BP 110/64     2 Minute Post BP 102/70            Oxygen Initial Assessment:   Oxygen Re-Evaluation:   Oxygen Discharge (Final Oxygen Re-Evaluation):   Initial Exercise Prescription:  Initial Exercise Prescription - 02/29/20 1600      Date of Initial Exercise RX and Referring Provider   Date 02/29/20    Referring Provider Green Green    Expected Discharge Date 04/28/20      NuStep   Level 1    SPM 70    Minutes 30    METs 1.9      Prescription Details   Frequency (times per week) 3    Duration Progress to 30 minutes of continuous aerobic without signs/symptoms of physical distress      Intensity   THRR 40-80% of Max Heartrate 62-123    Ratings of Perceived Exertion 11-13    Perceived Dyspnea 0-4      Progression   Progression Continue progressive overload as per policy without signs/symptoms or physical distress.      Resistance Training   Training Prescription Yes    Weight 3   Seated in chair   Reps 10-15           Perform Capillary Blood Glucose checks as needed.  Exercise Prescription Changes:   Exercise Prescription Changes    Row Name 03/06/20 1400             Response to Exercise   Blood Pressure (Admit) 92/62       Blood Pressure (Exercise) 124/80       Blood Pressure (Exit) 97/68       Heart Rate (Admit) 80 bpm       Heart Rate (Exercise) 90 bpm       Heart Rate (Exit) 74 bpm       Rating of Perceived Exertion (Exercise) 11       Symptoms None       Comments Pt's first day of exercise.       Duration Progress to 30 minutes of  aerobic without signs/symptoms of physical distress       Intensity THRR unchanged         Progression  Progression Continue to progress workloads to maintain intensity without signs/symptoms of physical distress.        Average METs 2.1         Resistance Training   Training Prescription Yes       Weight 3       Reps 10-15       Time 10 Minutes         Interval Training   Interval Training No         NuStep   Level 1       SPM 70       Minutes 30       METs 2.1              Exercise Comments:   Exercise Comments    Row Name 03/06/20 1422           Exercise Comments Pt's first day of exercise. Tolerated session well with no complaints.              Exercise Goals and Review:   Exercise Goals    Row Name 03/01/20 0813             Exercise Goals   Increase Physical Activity Yes       Intervention Provide advice, education, support and counseling about physical activity/exercise needs.;Develop an individualized exercise prescription for aerobic and resistive training based on initial evaluation findings, risk stratification, comorbidities and participant's personal goals.       Expected Outcomes Short Term: Attend rehab on a regular basis to increase amount of physical activity.;Long Term: Add in home exercise to make exercise part of routine and to increase amount of physical activity.;Long Term: Exercising regularly at least 3-5 days a week.       Increase Strength and Stamina Yes       Intervention Provide advice, education, support and counseling about physical activity/exercise needs.;Develop an individualized exercise prescription for aerobic and resistive training based on initial evaluation findings, risk stratification, comorbidities and participant's personal goals.       Expected Outcomes Short Term: Increase workloads from initial exercise prescription for resistance, speed, and METs.;Short Term: Perform resistance training exercises routinely during rehab and add in resistance training at home;Long Term: Improve cardiorespiratory fitness, muscular endurance and strength as measured by increased METs and functional capacity (6MWT)       Able to understand and use rate of  perceived exertion (RPE) scale Yes       Intervention Provide education and explanation on how to use RPE scale       Expected Outcomes Short Term: Able to use RPE daily in rehab to express subjective intensity level;Long Term:  Able to use RPE to guide intensity level when exercising independently       Knowledge and understanding of Target Heart Rate Range (THRR) Yes       Intervention Provide education and explanation of THRR including how the numbers were predicted and where they are located for reference       Expected Outcomes Short Term: Able to state/look up THRR;Long Term: Able to use THRR to govern intensity when exercising independently;Short Term: Able to use daily as guideline for intensity in rehab       Able to check pulse independently Yes       Intervention Provide education and demonstration on how to check pulse in carotid and radial arteries.;Review the importance of being able to check your own pulse for safety during independent exercise  Expected Outcomes Short Term: Able to explain why pulse checking is important during independent exercise;Long Term: Able to check pulse independently and accurately       Understanding of Exercise Prescription Yes       Intervention Provide education, explanation, and written materials on patient's individual exercise prescription       Expected Outcomes Short Term: Able to explain program exercise prescription;Long Term: Able to explain home exercise prescription to exercise independently              Exercise Goals Re-Evaluation :  Exercise Goals Re-Evaluation    [place] Name 03/06/20 1420             Exercise Goal Re-Evaluation   Exercise Goals Review Increase Physical Activity;Increase Strength and Stamina;Able to understand and use rate of perceived exertion (RPE) scale;Knowledge and understanding of Target Heart Rate Range (THRR);Able to check pulse independently;Understanding of Exercise Prescription       Comments Pt's  first day of exercise in [facility] program. He tolerated the exercise prescription welll and understands RPE scale, THRR and Exericse Rx.       Expected Outcomes Will continue to monitor and progress patient as tolerated.              Discharge Exercise Prescription (Final Exercise Prescription Changes):  Exercise Prescription Changes - 03/06/20 1400      Response to Exercise   Blood Pressure (Admit) 92/62    Blood Pressure (Exercise) 124/80    Blood Pressure (Exit) 97/68    Heart Rate (Admit) 80 bpm    Heart Rate (Exercise) 90 bpm    Heart Rate (Exit) 74 bpm    Rating of Perceived Exertion (Exercise) 11    Symptoms None    Comments Pt's first day of exercise.    Duration Progress to 30 minutes of  aerobic without signs/symptoms of physical distress    Intensity THRR unchanged      Progression   Progression Continue to progress workloads to maintain intensity without signs/symptoms of physical distress.    Average METs 2.1      Resistance Training   Training Prescription Yes    Weight 3    Reps 10-15    Time 10 Minutes      Interval Training   Interval Training No      NuStep   Level 1    SPM 70    Minutes 30    METs 2.1           Nutrition:  Target Goals: Understanding of nutrition guidelines, daily intake of sodium 1500mg , cholesterol 200mg , calories 30% from fat and 7% or less from saturated fats, daily to have 5 or more servings of fruits and vegetables.  Biometrics:  Pre Biometrics - 02/29/20 1427      Pre Biometrics   Waist Circumference 45.5 inches    Hip Circumference 46 inches    Waist to Hip Ratio 0.99 %    Triceps Skinfold 20 mm    % Body Fat 32.2 %    Grip Strength 43 kg    Flexibility 16.5 in    Single Leg Stand --   Not done due to charcot foot           Nutrition Therapy Plan and Nutrition Goals:   Nutrition Assessments:   Nutrition Goals Re-Evaluation:   Nutrition Goals Re-Evaluation:   Nutrition Goals Discharge (Final  Nutrition Goals Re-Evaluation):   Psychosocial: Target Goals: Acknowledge presence or absence of significant depression and/or  stress, maximize coping skills, provide positive support system. Participant is able to verbalize types and ability to use techniques and skills needed for reducing stress and depression.  Initial Review & Psychosocial Screening:  Initial Psych Review & Screening - 02/29/20 1500      Initial Review   Current issues with None Identified      Family Dynamics   Good Support System? Yes    Comments Patient states he lives with his sister and her partner. States sister is his dual POA and greatly assists with his affiairs that require financial or medical decisions. Mr. Foot feels he has a good support system and denies psychosocial barriers to participating in CR. Transportation provided by family.      Barriers   Psychosocial barriers to participate in program There are no identifiable barriers or psychosocial needs.      Screening Interventions   Interventions Encouraged to exercise           Quality of Life Scores:  Quality of Life - 03/06/20 1623      Quality of Life   Select Quality of Life      Quality of Life Scores   Health/Function Pre 22.5 %    Socioeconomic Pre 22.5 %    Psych/Spiritual Pre 22.5 %    Family Pre 22.5 %    GLOBAL Pre 22.5 %          Scores of 19 and below usually indicate a poorer quality of life in these areas.  A difference of  2-3 points is a clinically meaningful difference.  A difference of 2-3 points in the total score of the Quality of Life Index has been associated with significant improvement in overall quality of life, self-image, physical symptoms, and general health in studies assessing change in quality of life.  PHQ-9: Recent Review Flowsheet Data    Depression screen [facility] 2/9 02/29/2020   Decreased Interest 0   Down, Depressed, Hopeless 0   PHQ - 2 Score 0     Interpretation of Total Score  Total Score  Depression Severity:  1-4 = Minimal depression, 5-9 = Mild depression, 10-14 = Moderate depression, 15-19 = Moderately severe depression, 20-27 = Severe depression   Psychosocial Evaluation and Intervention:  Psychosocial Evaluation - 03/06/20 1551      Psychosocial Evaluation & Interventions   Interventions Encouraged to exercise with the program and follow exercise prescription    Comments Mr. Bi presents to todays cardiac rehab exercise session with a positive outlook and attitude. He does not have a history of depression or anxiety but per patient and family, he has a delusional disorder that was diagnosed 20 years ago however patient never followed up. His sister is dual POA and helps patient manage his personal affairs. He does not allow her to manage his medical appointments and often does not follow up. Family is working on this. Mr Mapps enjoys playing the guitar, interacting with his 4 cats, and watching TV. He uses these hobbies to manage stress.    Expected Outcomes Patient will continue to maintain a positive outlook and attitude. He will remain oriented x 4 and enjoy participating in CR.    Continue Psychosocial Services  No Follow up required           Psychosocial Re-Evaluation:   Psychosocial Discharge (Final Psychosocial Re-Evaluation):   Vocational Rehabilitation: Provide vocational rehab assistance to qualifying candidates.   Vocational Rehab Evaluation & Intervention:   Education: Education Goals: Education classes will  be provided on a weekly basis, covering required topics. Participant will state understanding/return demonstration of topics presented.  Learning Barriers/Preferences:  Learning Barriers/Preferences - 02/29/20 1600      Learning Barriers/Preferences   Learning Barriers Sight   H/O Delusional Disorder   Learning Preferences Verbal Instruction;Pictoral;Skilled Demonstration;Audio           Education Topics: Count Your Pulse:  -Group  instruction provided by verbal instruction, demonstration, patient participation and written materials to support subject.  Instructors address importance of being able to find your pulse and how to count your pulse when at home without a heart monitor.  Patients get hands on experience counting their pulse with staff help and individually.   Heart Attack, Angina, and Risk Factor Modification:  -Group instruction provided by verbal instruction, video, and written materials to support subject.  Instructors address signs and symptoms of angina and heart attacks.    Also discuss risk factors for heart disease and how to make changes to improve heart health risk factors.   Functional Fitness:  -Group instruction provided by verbal instruction, demonstration, patient participation, and written materials to support subject.  Instructors address safety measures for doing things around the house.  Discuss how to get up and down off the floor, how to pick things up properly, how to safely get out of a chair without assistance, and balance training.   Meditation and Mindfulness:  -Group instruction provided by verbal instruction, patient participation, and written materials to support subject.  Instructor addresses importance of mindfulness and meditation practice to help reduce stress and improve awareness.  Instructor also leads participants through a meditation exercise.    Stretching for Flexibility and Mobility:  -Group instruction provided by verbal instruction, patient participation, and written materials to support subject.  Instructors lead participants through series of stretches that are designed to increase flexibility thus improving mobility.  These stretches are additional exercise for major muscle groups that are typically performed during regular warm up and cool down.   Hands Only CPR:  -Group verbal, video, and participation provides a basic overview of AHA guidelines for community CPR.  Role-play of emergencies allow participants the opportunity to practice calling for help and chest compression technique with discussion of AED use.   Hypertension: -Group verbal and written instruction that provides a basic overview of hypertension including the most recent diagnostic guidelines, risk factor reduction with self-care instructions and medication management.    Nutrition I class: Heart Healthy Eating:  -Group instruction provided by PowerPoint slides, verbal discussion, and written materials to support subject matter. The instructor gives an explanation and review of the Therapeutic Lifestyle Changes diet recommendations, which includes a discussion on lipid goals, dietary fat, sodium, fiber, plant stanol/sterol esters, sugar, and the components of a well-balanced, healthy diet.   Nutrition II class: Lifestyle Skills:  -Group instruction provided by PowerPoint slides, verbal discussion, and written materials to support subject matter. The instructor gives an explanation and review of label reading, grocery shopping for heart health, heart healthy recipe modifications, and ways to make healthier choices when eating out.   Diabetes Question & Answer:  -Group instruction provided by PowerPoint slides, verbal discussion, and written materials to support subject matter. The instructor gives an explanation and review of diabetes co-morbidities, pre- and post-prandial blood glucose goals, pre-exercise blood glucose goals, signs, symptoms, and treatment of hypoglycemia and hyperglycemia, and foot care basics.   Diabetes Blitz:  -Group instruction provided by [employer], verbal discussion, and written materials to support  subject matter. The instructor gives an explanation and review of the physiology behind type 1 and type 2 diabetes, diabetes medications and rational behind using different medications, pre- and post-prandial blood glucose recommendations and Hemoglobin A1c goals,  diabetes diet, and exercise including blood glucose guidelines for exercising safely.    Portion Distortion:  -Group instruction provided by PowerPoint slides, verbal discussion, written materials, and food models to support subject matter. The instructor gives an explanation of serving size versus portion size, changes in portions sizes over the last 20 years, and what consists of a serving from each food group.   Stress Management:  -Group instruction provided by verbal instruction, video, and written materials to support subject matter.  Instructors review role of stress in heart disease and how to cope with stress positively.     Exercising on Your Own:  -Group instruction provided by verbal instruction, power point, and written materials to support subject.  Instructors discuss benefits of exercise, components of exercise, frequency and intensity of exercise, and end points for exercise.  Also discuss use of nitroglycerin and activating EMS.  Review options of places to exercise outside of rehab.  Review guidelines for sex with heart disease.   Cardiac Drugs I:  -Group instruction provided by verbal instruction and written materials to support subject.  Instructor reviews cardiac drug classes: antiplatelets, anticoagulants, beta blockers, and statins.  Instructor discusses reasons, side effects, and lifestyle considerations for each drug class.   Cardiac Drugs II:  -Group instruction provided by verbal instruction and written materials to support subject.  Instructor reviews cardiac drug classes: angiotensin converting enzyme inhibitors (ACE-I), angiotensin II receptor blockers (ARBs), nitrates, and calcium channel blockers.  Instructor discusses reasons, side effects, and lifestyle considerations for each drug class.   Anatomy and Physiology of the Circulatory System:  Group verbal and written instruction and models provide basic cardiac anatomy and physiology, with the coronary  electrical and arterial systems. Review of: AMI, Angina, Valve disease, Heart Failure, Peripheral Artery Disease, Cardiac Arrhythmia, Pacemakers, and the ICD.   Other Education:  -Group or individual verbal, written, or video instructions that support the educational goals of the cardiac rehab program.   Holiday Eating Survival Tips:  -Group instruction provided by PowerPoint slides, verbal discussion, and written materials to support subject matter. The instructor gives patients tips, tricks, and techniques to help them not only survive but enjoy the holidays despite the onslaught of food that accompanies the holidays.   Knowledge Questionnaire Score:  Knowledge Questionnaire Score - 03/06/20 1623      Knowledge Questionnaire Score   Pre Score 18/24           Core Components/Risk Factors/Patient Goals at Admission:  Personal Goals and Risk Factors at Admission - 02/29/20 1630      Core Components/Risk Factors/Patient Goals on Admission    Weight Management Yes;Obesity;Weight Loss    Intervention Weight Management: Develop a combined nutrition and exercise program designed to reach desired caloric intake, while maintaining appropriate intake of nutrient and fiber, sodium and fats, and appropriate energy expenditure required for the weight goal.;Weight Management: Provide education and appropriate resources to help participant work on and attain dietary goals.;Weight Management/Obesity: Establish reasonable short term and long term weight goals.;Obesity: Provide education and appropriate resources to help participant work on and attain dietary goals.    Admit Weight 241 lb 4.8 oz (109.5 kg)    Goal Weight: Long Term 210 lb (95.3 kg)    Expected Outcomes Short Term: Continue to  assess and modify interventions until short term weight is achieved;Long Term: Adherence to nutrition and physical activity/exercise program aimed toward attainment of established weight goal;Weight Loss:  Understanding of general recommendations for a balanced deficit meal plan, which promotes 1-2 lb weight loss per week and includes a negative energy balance of [phone] kcal/d;Understanding recommendations for meals to include 15-35% energy as protein, 25-35% energy from fat, 35-60% energy from carbohydrates, less than 200mg  of dietary cholesterol, 20-35 gm of total fiber daily;Understanding of distribution of calorie intake throughout the day with the consumption of 4-5 meals/snacks    Diabetes Yes    Intervention Provide education about signs/symptoms and action to take for hypo/hyperglycemia.;Provide education about proper nutrition, including hydration, and aerobic/resistive exercise prescription along with prescribed medications to achieve blood glucose in normal ranges: Fasting glucose 65-99 mg/dL    Expected Outcomes Short Term: Participant verbalizes understanding of the signs/symptoms and immediate care of hyper/hypoglycemia, proper foot care and importance of medication, aerobic/resistive exercise and nutrition plan for blood glucose control.;Long Term: Attainment of HbA1C < 7%.    Hypertension Yes    Intervention Provide education on lifestyle modifcations including regular physical activity/exercise, weight management, moderate sodium restriction and increased consumption of fresh fruit, vegetables, and low fat dairy, alcohol moderation, and smoking cessation.;Monitor prescription use compliance.    Expected Outcomes Short Term: Continued assessment and intervention until BP is < 140/18mm HG in hypertensive participants. < 130/76mm HG in hypertensive participants with diabetes, heart failure or chronic kidney disease.;Long Term: Maintenance of blood pressure at goal levels.    Lipids Yes    Intervention Provide education and support for participant on nutrition & aerobic/resistive exercise along with prescribed medications to achieve LDL 70mg , HDL >40mg .    Expected Outcomes Short Term:  Participant states understanding of desired cholesterol values and is compliant with medications prescribed. Participant is following exercise prescription and nutrition guidelines.;Long Term: Cholesterol controlled with medications as prescribed, with individualized exercise RX and with personalized nutrition plan. Value goals: LDL < 70mg , HDL > 40 mg.           Core Components/Risk Factors/Patient Goals Review:   Goals and Risk Factor Review    Row Name 03/06/20 1555             Core Components/Risk Factors/Patient Goals Review   Personal Goals Review Weight Management/Obesity;Lipids;Diabetes;Hypertension       Review Mr. Robitaille has multiple CAD risk factors. Per sister, he often does not follow up with his providers and she has a hard time "making" him follow his providers medical advice. He does take all medications as prescribed and his sisters partner assist him with checking his blood sugars. He has not been wearing a boot for charcot foot however today he presents with boot on stating he is "doing what we advised him to do". His goals are to keep taking his medications, sticking with a heart healthy diabetic diet, and to loose weight. He will work with RD on nutritional goals.       Expected Outcomes Patient will continue to participate in CR for education and risk factor modifications.              Core Components/Risk Factors/Patient Goals at Discharge (Final Review):   Goals and Risk Factor Review - 03/06/20 1555      Core Components/Risk Factors/Patient Goals Review   Personal Goals Review Weight Management/Obesity;Lipids;Diabetes;Hypertension    Review Mr. Haque has multiple CAD risk factors. Per sister, he often does not follow up  with his providers and she has a hard time "making" him follow his providers medical advice. He does take all medications as prescribed and his sisters partner assist him with checking his blood sugars. He has not been wearing a boot for charcot  foot however today he presents with boot on stating he is "doing what we advised him to do". His goals are to keep taking his medications, sticking with a heart healthy diabetic diet, and to loose weight. He will work with RD on nutritional goals.    Expected Outcomes Patient will continue to participate in CR for education and risk factor modifications.           ITP Comments:  ITP Comments    Row Name 02/29/20 1426 03/06/20 1547         ITP Comments Dr. Fransico Him Medical Director Cardiac Rehab [facility] 30 day ITP review: Mr. Dovidio completed his first cardiac rehab exercise session and tolerated very well. He denied complaints of dizziness or shortness of breath. States he has followed advise of CR staff and has not been outside walking when it is extremely hot. Mr. Zatarain had stated during his orientation walktest that he was experiencing dizziness when he would walk outside during the heat. MD notified. No new interventions except to not exert if temp >85 or humidity >75. VSS during todays exercise session. Worked to an RPE of 11 on the NuStep for 30 min. Will continue to monitor             Comments: see ITP comments

## 2020-03-08 ENCOUNTER — Encounter (HOSPITAL_COMMUNITY)
Admission: RE | Admit: 2020-03-08 | Discharge: 2020-03-08 | Disposition: A | Discharge status: Home / Self Care | Payer: Medicare HMO | Source: Ambulatory Visit | Attending: Cardiology | Admitting: Cardiology

## 2020-03-08 ENCOUNTER — Other Ambulatory Visit: Payer: Self-pay

## 2020-03-08 DIAGNOSIS — I2102 ST elevation (STEMI) myocardial infarction involving left anterior descending coronary artery: Secondary | ICD-10-CM

## 2020-03-09 LAB — GLUCOSE, CAPILLARY: Glucose-Capillary: 184 mg/dL — ABNORMAL HIGH (ref 70–99)

## 2020-03-10 ENCOUNTER — Encounter (HOSPITAL_COMMUNITY)
Admission: RE | Admit: 2020-03-10 | Discharge: 2020-03-10 | Disposition: A | Discharge status: Home / Self Care | Payer: Medicare HMO | Source: Ambulatory Visit | Attending: Cardiology | Admitting: Cardiology

## 2020-03-10 ENCOUNTER — Other Ambulatory Visit: Payer: Self-pay

## 2020-03-10 DIAGNOSIS — I2102 ST elevation (STEMI) myocardial infarction involving left anterior descending coronary artery: Secondary | ICD-10-CM

## 2020-03-13 ENCOUNTER — Encounter (HOSPITAL_COMMUNITY)
Admission: RE | Admit: 2020-03-13 | Discharge: 2020-03-13 | Disposition: A | Discharge status: Home / Self Care | Payer: Medicare HMO | Source: Ambulatory Visit | Attending: Cardiology | Admitting: Cardiology

## 2020-03-13 DIAGNOSIS — I2102 ST elevation (STEMI) myocardial infarction involving left anterior descending coronary artery: Secondary | ICD-10-CM

## 2020-03-15 ENCOUNTER — Encounter (HOSPITAL_COMMUNITY)
Admission: RE | Admit: 2020-03-15 | Discharge: 2020-03-15 | Disposition: A | Discharge status: Home / Self Care | Payer: Medicare HMO | Source: Ambulatory Visit | Attending: Cardiology | Admitting: Cardiology

## 2020-03-15 ENCOUNTER — Other Ambulatory Visit: Payer: Self-pay

## 2020-03-15 DIAGNOSIS — I2102 ST elevation (STEMI) myocardial infarction involving left anterior descending coronary artery: Secondary | ICD-10-CM

## 2020-03-15 NOTE — Progress Notes (Signed)
Evaristo Bury 69 y.o. male Nutrition Note  Visit Diagnosis: ST elevation myocardial infarction involving left anterior descending (LAD) coronary artery [facility])  Past Medical History:  Diagnosis Date  . Allergy   . Bilateral carpal tunnel syndrome 10/19/2019  . Chronic kidney disease   . Delusional disorder ([place])   . Diabetes type 2, uncontrolled ([place]) 03/26/2017     Medications reviewed.   Current Outpatient Medications:  .  aspirin 81 MG chewable tablet, Chew 1 tablet (81 mg total) by mouth daily., Disp: 30 tablet, Rfl: 11 .  atorvastatin (LIPITOR) 80 MG tablet, Take 1 tablet (80 mg total) by mouth daily., Disp: 30 tablet, Rfl: 11 .  carvedilol (COREG) 6.25 MG tablet, Take 1 tablet (6.25 mg total) by mouth 2 (two) times daily with a meal., Disp: 60 tablet, Rfl: 11 .  empagliflozin (JARDIANCE) 10 MG TABS tablet, Take 1 tablet (10 mg total) by mouth daily before breakfast., Disp: 90 tablet, Rfl: 3 .  losartan (COZAAR) 25 MG tablet, Take 1 tablet (25 mg total) by mouth daily., Disp: 30 tablet, Rfl: 11 .  metFORMIN (GLUCOPHAGE) 500 MG tablet, Take 1 tablet (500 mg total) by mouth 2 (two) times daily with a meal. (Patient taking differently: Take 1,000 mg by mouth daily with breakfast. ), Disp: , Rfl:  .  Multiple Vitamin (MULTIVITAMIN) tablet, Take 1 tablet by mouth daily., Disp: , Rfl:  .  nitroGLYCERIN (NITROSTAT) 0.4 MG SL tablet, Place 1 tablet (0.4 mg total) under the tongue every 5 (five) minutes x 3 doses as needed for chest pain., Disp: 25 tablet, Rfl: 1 .  ticagrelor (BRILINTA) 90 MG TABS tablet, Take 1 tablet (90 mg total) by mouth 2 (two) times daily., Disp: 90 tablet, Rfl: 3 .  VITAMIN E PO, Take 1 tablet by mouth daily., Disp: , Rfl:    Ht Readings from Last 1 Encounters:  02/29/20 6\' 1"  (1.854 m)     Wt Readings from Last 3 Encounters:  02/29/20 241 lb 6.5 oz (109.5 kg)  01/21/20 256 lb 3.2 oz (116.2 kg)  01/12/20 259 lb 0.7 oz (117.5 kg)     There is no height or weight  on file to calculate BMI.   Social History   Tobacco Use  Smoking Status Never Smoker  Smokeless Tobacco Never Used     Lab Results  Component Value Date   CHOL 113 03/03/2020   Lab Results  Component Value Date   HDL 41 03/03/2020   Lab Results  Component Value Date   LDLCALC 54 03/03/2020   Lab Results  Component Value Date   TRIG 90 03/03/2020     Lab Results  Component Value Date   HGBA1C 10.0 (H) 01/10/2020     CBG (last 3)  No results for input(s): GLUCAP in the last 72 hours.   Nutrition Note  Spoke with pt. Nutrition Plan and Nutrition Survey goals reviewed with pt. Pt is following a Heart Healthy diet. Pt wants to lose wt. Pt has been trying to lose wt by reducing portions, choosing foods from the heart healthy food list, and avoiding desserts/refined carbs. He has lost 25 lbs since his MI. His long term weight loss goal is 220 lbs.   Pt has Type 2 Diabetes. Pt checks CBG's 3-4 times per week. Fasting CBG's reportedly 158-178 mg/dL. He states taking medications as prescribe and trying to make appropriate diet changes.   Pt expressed understanding of the information reviewed.    Nutrition Diagnosis ?  Excessive carbohydrate intake related to food preferences and lack of food related knowledge as evidenced by A1C 10.0 and fasting CBGs 158-178 mg/dl   Nutrition Intervention ? Pt's individual nutrition plan reviewed with pt. ? Benefits of adopting Heart Healthy diet discussed when Med[employer]icts reviewed.Continue client-centered nutrition education by RD, as part of interdisciplinary care.  Goal(s) ? Checking CBG daily and CBG concentrations in the normal range or as close to normal as is safely possible. ? Improved blood glucose control as evidenced by pt's A1c trending from 10 toward less than 7.0. ? Pt able to name foods that affect blood glucose  Plan:   Will provide client-centered nutrition education as part of interdisciplinary care  Monitor and  evaluate progress toward nutrition goal with team.   Michaele Offer, MS, RDN, LDN

## 2020-03-17 ENCOUNTER — Other Ambulatory Visit: Payer: Self-pay

## 2020-03-17 ENCOUNTER — Encounter (HOSPITAL_COMMUNITY)
Admission: RE | Admit: 2020-03-17 | Discharge: 2020-03-17 | Disposition: A | Discharge status: Home / Self Care | Payer: Medicare HMO | Source: Ambulatory Visit | Attending: Cardiology | Admitting: Cardiology

## 2020-03-17 DIAGNOSIS — I2102 ST elevation (STEMI) myocardial infarction involving left anterior descending coronary artery: Secondary | ICD-10-CM | POA: Diagnosis not present

## 2020-03-20 ENCOUNTER — Encounter (HOSPITAL_COMMUNITY)
Admission: RE | Admit: 2020-03-20 | Discharge: 2020-03-20 | Disposition: A | Discharge status: Home / Self Care | Payer: Medicare HMO | Source: Ambulatory Visit | Attending: Cardiology | Admitting: Cardiology

## 2020-03-20 ENCOUNTER — Telehealth (HOSPITAL_COMMUNITY): Payer: Self-pay

## 2020-03-20 ENCOUNTER — Other Ambulatory Visit: Payer: Self-pay

## 2020-03-20 DIAGNOSIS — E1165 Type 2 diabetes mellitus with hyperglycemia: Secondary | ICD-10-CM

## 2020-03-20 DIAGNOSIS — I2102 ST elevation (STEMI) myocardial infarction involving left anterior descending coronary artery: Secondary | ICD-10-CM

## 2020-03-20 NOTE — Telephone Encounter (Signed)
Cardiac Rehab Telephone Note:  Mr. Chalfin presents to cardiac rehab today with c/o left ankle pain, wound on R foot. Upon assessment of foot, quarter size escar area noted on ball of foot. States he "shaved it off with his knife this weekend". Also states he had recently placed "duck tape" on the area however when removed this weekend, he created an open wound on the inner part of his great toe. Mr. Nicolson requested RN calls Marla Roe, patients live in caregiver to discuss. Per Vickie, patient presented to PCP last week and wound was evaluated. Awaiting referral to podiatrist. Patient educated on s/s of infection as patient is uncontrolled diabetic. He is also encouraged to wear orthopedic boot for Charcot foot as prescribed by orthopedic. Patient has history of not following up with providers as scheduled. Per sister, Magda Paganini [facility]) patient also is non-compliant with recommended medical therapies. Patient would benefit from [facility] referral. Will discuss with Cardiologist as patients PCP is with [place] Rollene Rotunda RN, BSN [place]. [facility]  Cardiac and Pulmonary Rehabilitation Phone: [phone] Fax: [phone]

## 2020-03-22 ENCOUNTER — Other Ambulatory Visit: Payer: Self-pay

## 2020-03-22 ENCOUNTER — Encounter (HOSPITAL_COMMUNITY)
Admission: RE | Admit: 2020-03-22 | Discharge: 2020-03-22 | Disposition: A | Discharge status: Home / Self Care | Payer: Medicare HMO | Source: Ambulatory Visit | Attending: Cardiology | Admitting: Cardiology

## 2020-03-22 DIAGNOSIS — I2102 ST elevation (STEMI) myocardial infarction involving left anterior descending coronary artery: Secondary | ICD-10-CM

## 2020-03-22 DIAGNOSIS — E1165 Type 2 diabetes mellitus with hyperglycemia: Secondary | ICD-10-CM | POA: Diagnosis not present

## 2020-03-24 ENCOUNTER — Encounter (HOSPITAL_COMMUNITY)
Admission: RE | Admit: 2020-03-24 | Discharge: 2020-03-24 | Disposition: A | Discharge status: Home / Self Care | Payer: Medicare HMO | Source: Ambulatory Visit | Attending: Cardiology | Admitting: Cardiology

## 2020-03-24 ENCOUNTER — Other Ambulatory Visit: Payer: Self-pay

## 2020-03-24 DIAGNOSIS — E1165 Type 2 diabetes mellitus with hyperglycemia: Secondary | ICD-10-CM | POA: Diagnosis not present

## 2020-03-24 DIAGNOSIS — I2102 ST elevation (STEMI) myocardial infarction involving left anterior descending coronary artery: Secondary | ICD-10-CM

## 2020-03-27 ENCOUNTER — Encounter (HOSPITAL_COMMUNITY)
Admission: RE | Admit: 2020-03-27 | Discharge: 2020-03-27 | Disposition: A | Discharge status: Home / Self Care | Payer: Medicare HMO | Source: Ambulatory Visit | Attending: Cardiology | Admitting: Cardiology

## 2020-03-27 ENCOUNTER — Other Ambulatory Visit: Payer: Self-pay

## 2020-03-27 DIAGNOSIS — E1165 Type 2 diabetes mellitus with hyperglycemia: Secondary | ICD-10-CM

## 2020-03-27 DIAGNOSIS — I2102 ST elevation (STEMI) myocardial infarction involving left anterior descending coronary artery: Secondary | ICD-10-CM

## 2020-03-28 NOTE — Progress Notes (Signed)
Cardiac Individual Treatment Plan  Patient Details  Name: [name] MRN: [id number] Date of Birth: 1951-07-03 Referring Provider:     [place] from 02/29/2020 in [facility]  Referring Provider [name], [name]      Initial Encounter Date:    CARDIAC REHAB PHASE II ORIENTATION from 02/29/2020 in [facility]  Date 02/29/20      Visit Diagnosis: Uncontrolled type 2 diabetes mellitus with hyperglycemia ([place]) - Plan: AMB Referral to [place] elevation myocardial infarction involving left anterior descending (LAD) coronary artery ([place])  Patient's Home Medications on Admission:  Current Outpatient Medications:  .  aspirin 81 MG chewable tablet, Chew 1 tablet (81 mg total) by mouth daily., Disp: 30 tablet, Rfl: 11 .  atorvastatin (LIPITOR) 80 MG tablet, Take 1 tablet (80 mg total) by mouth daily., Disp: 30 tablet, Rfl: 11 .  carvedilol (COREG) 6.25 MG tablet, Take 1 tablet (6.25 mg total) by mouth 2 (two) times daily with a meal., Disp: 60 tablet, Rfl: 11 .  empagliflozin (JARDIANCE) 10 MG TABS tablet, Take 1 tablet (10 mg total) by mouth daily before breakfast., Disp: 90 tablet, Rfl: 3 .  losartan (COZAAR) 25 MG tablet, Take 1 tablet (25 mg total) by mouth daily., Disp: 30 tablet, Rfl: 11 .  metFORMIN (GLUCOPHAGE) 500 MG tablet, Take 1 tablet (500 mg total) by mouth 2 (two) times daily with a meal. (Patient taking differently: Take 1,000 mg by mouth daily with breakfast. ), Disp: , Rfl:  .  Multiple Vitamin (MULTIVITAMIN) tablet, Take 1 tablet by mouth daily., Disp: , Rfl:  .  nitroGLYCERIN (NITROSTAT) 0.4 MG SL tablet, Place 1 tablet (0.4 mg total) under the tongue every 5 (five) minutes x 3 doses as needed for chest pain., Disp: 25 tablet, Rfl: 1 .  ticagrelor (BRILINTA) 90 MG TABS tablet, Take 1 tablet (90 mg total) by mouth 2 (two) times daily., Disp: 90 tablet, Rfl: 3 .   VITAMIN E PO, Take 1 tablet by mouth daily., Disp: , Rfl:   Past Medical History: Past Medical History:  Diagnosis Date  . Allergy   . Bilateral carpal tunnel syndrome 10/19/2019  . Chronic kidney disease   . Delusional disorder ([place])   . Diabetes type 2, uncontrolled ([place]) 03/26/2017    Tobacco Use: Social History   Tobacco Use  Smoking Status Never Smoker  Smokeless Tobacco Never Used    Labs: Recent Review Flowsheet Data    Labs for ITP Cardiac and Pulmonary Rehab Latest Ref Rng & Units 03/06/2017 01/10/2020 03/03/2020   Cholestrol 100 - 199 mg/dL 133 158 113   LDLCALC 0 - 99 mg/dL 74 100(H) 54   HDL >39 mg/dL 39.50 39(L) 41   Trlycerides 0 - 149 mg/dL 99.0 96 90   Hemoglobin A1c 4.8 - 5.6 % 11.4(H) 10.0(H) -      Capillary Blood Glucose: Lab Results  Component Value Date   GLUCAP 184 (H) 03/08/2020   GLUCAP 139 (H) 03/06/2020   GLUCAP 156 (H) 03/06/2020   GLUCAP 155 (H) 02/29/2020   GLUCAP 192 (H) 01/12/2020     Exercise Target Goals: Exercise Program Goal: Individual exercise prescription set using results from initial 6 min walk test and THRR while considering  patient's activity barriers and safety.   Exercise Prescription Goal: Initial exercise prescription builds to 30-45 minutes a day of aerobic activity, 2-3 days per week.  Home exercise guidelines will  be given to patient during program as part of exercise prescription that the participant will acknowledge.  Activity Barriers & Risk Stratification:  Activity Barriers & Cardiac Risk Stratification - 02/29/20 1633      Activity Barriers & Cardiac Risk Stratification   Activity Barriers Deconditioning;Shortness of Breath;Balance Concerns;Other (comment)    Comments Charcot Foot    Cardiac Risk Stratification High           6 Minute Walk:  6 Minute Walk    Row Name 02/29/20 1630         6 Minute Walk   Phase Initial     Distance 1378 feet  Nustep used due to pt with Dx: Charcot Foot     Walk Time  6 minutes     # of Rest Breaks 0     MPH 2.6     METS 2.76     RPE 7     Perceived Dyspnea  0     VO2 Peak 9.66     Symptoms No     Resting HR 74 bpm     Resting BP 98/56     Resting Oxygen Saturation  97 %     Exercise Oxygen Saturation  during 6 min walk 97 %     Max Ex. HR 84 bpm     Max Ex. BP 110/64     2 Minute Post BP 102/70            Oxygen Initial Assessment:   Oxygen Re-Evaluation:   Oxygen Discharge (Final Oxygen Re-Evaluation):   Initial Exercise Prescription:  Initial Exercise Prescription - 02/29/20 1600      Date of Initial Exercise RX and Referring Provider   Date 02/29/20    Referring Provider [name], [name]    Expected Discharge Date 04/28/20      NuStep   Level 1    SPM 70    Minutes 30    METs 1.9      Prescription Details   Frequency (times per week) 3    Duration Progress to 30 minutes of continuous aerobic without signs/symptoms of physical distress      Intensity   THRR 40-80% of Max Heartrate 62-123    Ratings of Perceived Exertion 11-13    Perceived Dyspnea 0-4      Progression   Progression Continue progressive overload as per policy without signs/symptoms or physical distress.      Resistance Training   Training Prescription Yes    Weight 3   Seated in chair   Reps 10-15           Perform Capillary Blood Glucose checks as needed.  Exercise Prescription Changes:   Exercise Prescription Changes    Row Name 03/06/20 1400 03/27/20 1630           Response to Exercise   Blood Pressure (Admit) 92/62 98/54      Blood Pressure (Exercise) 124/80 130/58      Blood Pressure (Exit) 97/68 112/60      Heart Rate (Admit) 80 bpm 99 bpm      Heart Rate (Exercise) 90 bpm 103 bpm      Heart Rate (Exit) 74 bpm 89 bpm      Rating of Perceived Exertion (Exercise) 11 12      Symptoms None None      Comments Pt's first day of exercise. Reviewed METs      Duration Progress to 30 minutes of  aerobic without signs/symptoms of  physical distress Progress to 30 minutes of  aerobic without signs/symptoms of physical distress      Intensity THRR unchanged THRR unchanged        Progression   Progression Continue to progress workloads to maintain intensity without signs/symptoms of physical distress. Continue to progress workloads to maintain intensity without signs/symptoms of physical distress.      Average METs 2.1 2.7        Resistance Training   Training Prescription Yes Yes      Weight 3 3 lbs      Reps 10-15 10-15      Time 10 Minutes 10 Minutes        Interval Training   Interval Training No No        NuStep   Level 1 --      SPM 70 --      Minutes 30 --      METs 2.1 --        Arm Ergometer   Level -- 2      Minutes -- 30      METs -- 2.7        Home Exercise Plan   Plans to continue exercise at -- --  Not done due to patients Charcot Foot             Exercise Comments:   Exercise Comments    Row Name 03/06/20 1422 03/27/20 1512         Exercise Comments Pt's first day of exercise. Tolerated session well with no complaints. Reviewed patients METs. Due to patient's Charcot foot, he found that the Nustep was uncomfortable. We have placed patient on the arm ergometer for 35 minutes total which includes 5 minute warm-up. He tolerates the arm ergometer well. Given the nature of the condition with his foot, we are not dicussing home exercise, such as walking as we do not to exacerbate this condition.             Exercise Goals and Review:   Exercise Goals    Row Name 03/01/20 0813             Exercise Goals   Increase Physical Activity Yes       Intervention Provide advice, education, support and counseling about physical activity/exercise needs.;Develop an individualized exercise prescription for aerobic and resistive training based on initial evaluation findings, risk stratification, comorbidities and participant's personal goals.       Expected Outcomes Short Term: Attend rehab on  a regular basis to increase amount of physical activity.;Long Term: Add in home exercise to make exercise part of routine and to increase amount of physical activity.;Long Term: Exercising regularly at least 3-5 days a week.       Increase Strength and Stamina Yes       Intervention Provide advice, education, support and counseling about physical activity/exercise needs.;Develop an individualized exercise prescription for aerobic and resistive training based on initial evaluation findings, risk stratification, comorbidities and participant's personal goals.       Expected Outcomes Short Term: Increase workloads from initial exercise prescription for resistance, speed, and METs.;Short Term: Perform resistance training exercises routinely during rehab and add in resistance training at home;Long Term: Improve cardiorespiratory fitness, muscular endurance and strength as measured by increased METs and functional capacity (6MWT)       Able to understand and use rate of perceived exertion (RPE) scale Yes       Intervention Provide education and explanation on how to  use RPE scale       Expected Outcomes Short Term: Able to use RPE daily in rehab to express subjective intensity level;Long Term:  Able to use RPE to guide intensity level when exercising independently       Knowledge and understanding of Target Heart Rate Range (THRR) Yes       Intervention Provide education and explanation of THRR including how the numbers were predicted and where they are located for reference       Expected Outcomes Short Term: Able to state/look up THRR;Long Term: Able to use THRR to govern intensity when exercising independently;Short Term: Able to use daily as guideline for intensity in rehab       Able to check pulse independently Yes       Intervention Provide education and demonstration on how to check pulse in carotid and radial arteries.;Review the importance of being able to check your own pulse for safety during  independent exercise       Expected Outcomes Short Term: Able to explain why pulse checking is important during independent exercise;Long Term: Able to check pulse independently and accurately       Understanding of Exercise Prescription Yes       Intervention Provide education, explanation, and written materials on patient's individual exercise prescription       Expected Outcomes Short Term: Able to explain program exercise prescription;Long Term: Able to explain home exercise prescription to exercise independently              Exercise Goals Re-Evaluation :  Exercise Goals Re-Evaluation    [place] Name 03/06/20 1420             Exercise Goal Re-Evaluation   Exercise Goals Review Increase Physical Activity;Increase Strength and Stamina;Able to understand and use rate of perceived exertion (RPE) scale;Knowledge and understanding of Target Heart Rate Range (THRR);Able to check pulse independently;Understanding of Exercise Prescription       Comments Pt's first day of exercise in [facility] program. He tolerated the exercise prescription welll and understands RPE scale, THRR and Exericse Rx.       Expected Outcomes Will continue to monitor and progress patient as tolerated.              Discharge Exercise Prescription (Final Exercise Prescription Changes):  Exercise Prescription Changes - 03/27/20 1630      Response to Exercise   Blood Pressure (Admit) 98/54    Blood Pressure (Exercise) 130/58    Blood Pressure (Exit) 112/60    Heart Rate (Admit) 99 bpm    Heart Rate (Exercise) 103 bpm    Heart Rate (Exit) 89 bpm    Rating of Perceived Exertion (Exercise) 12    Symptoms None    Comments Reviewed METs    Duration Progress to 30 minutes of  aerobic without signs/symptoms of physical distress    Intensity THRR unchanged      Progression   Progression Continue to progress workloads to maintain intensity without signs/symptoms of physical distress.    Average METs 2.7      Resistance  Training   Training Prescription Yes    Weight 3 lbs    Reps 10-15    Time 10 Minutes      Interval Training   Interval Training No      Arm Ergometer   Level 2    Minutes 30    METs 2.7      Home Exercise Plan   Plans to continue exercise  at --   Not done due to patients Charcot Foot          Nutrition:  Target Goals: Understanding of nutrition guidelines, daily intake of sodium 1500mg , cholesterol 200mg , calories 30% from fat and 7% or less from saturated fats, daily to have 5 or more servings of fruits and vegetables.  Biometrics:  Pre Biometrics - 02/29/20 1427      Pre Biometrics   Waist Circumference 45.5 inches    Hip Circumference 46 inches    Waist to Hip Ratio 0.99 %    Triceps Skinfold 20 mm    % Body Fat 32.2 %    Grip Strength 43 kg    Flexibility 16.5 in    Single Leg Stand --   Not done due to charcot foot           Nutrition Therapy Plan and Nutrition Goals:  Nutrition Therapy & Goals - 03/15/20 1442      Nutrition Therapy   Diet [place] Healthy/carb modified      Personal Nutrition Goals   Nutrition Goal Improved blood glucose control as evidenced by pt's A1c trending from 10 toward less than 7.0.    Personal Goal #2 Checking CBG daily and CBG concentrations in the normal range or as close to normal as is safely possible    Personal Goal #3 Pt able to name foods that affect blood glucose      Intervention Plan   Intervention Prescribe, educate and counsel regarding individualized specific dietary modifications aiming towards targeted core components such as weight, hypertension, lipid management, diabetes, heart failure and other comorbidities.;Nutrition handout(s) given to patient.    Expected Outcomes Short Term Goal: A plan has been developed with personal nutrition goals set during dietitian appointment.;Long Term Goal: Adherence to prescribed nutrition plan.           Nutrition Assessments:  Nutrition Assessments - 03/15/20 1443       MEDFICTS Scores   Pre Score 32           Nutrition Goals Re-Evaluation:  Nutrition Goals Re-Evaluation    [place] Name 03/15/20 1443             Goals   Current Weight 241 lb (109.3 kg)       Nutrition Goal Improved blood glucose control as evidenced by pt's A1c trending from 10 toward less than 7.0.         Personal Goal #2 Re-Evaluation   Personal Goal #2 Checking CBG daily and CBG concentrations in the normal range or as close to normal as is safely possible         Personal Goal #3 Re-Evaluation   Personal Goal #3 Pt able to name foods that affect blood glucose              Nutrition Goals Re-Evaluation:  Nutrition Goals Re-Evaluation    [place] Name 03/15/20 1443             Goals   Current Weight 241 lb (109.3 kg)       Nutrition Goal Improved blood glucose control as evidenced by pt's A1c trending from 10 toward less than 7.0.         Personal Goal #2 Re-Evaluation   Personal Goal #2 Checking CBG daily and CBG concentrations in the normal range or as close to normal as is safely possible         Personal Goal #3 Re-Evaluation   Personal Goal #3 Pt able to  name foods that affect blood glucose              Nutrition Goals Discharge (Final Nutrition Goals Re-Evaluation):  Nutrition Goals Re-Evaluation - 03/15/20 1443      Goals   Current Weight 241 lb (109.3 kg)    Nutrition Goal Improved blood glucose control as evidenced by pt's A1c trending from 10 toward less than 7.0.      Personal Goal #2 Re-Evaluation   Personal Goal #2 Checking CBG daily and CBG concentrations in the normal range or as close to normal as is safely possible      Personal Goal #3 Re-Evaluation   Personal Goal #3 Pt able to name foods that affect blood glucose           Psychosocial: Target Goals: Acknowledge presence or absence of significant depression and/or stress, maximize coping skills, provide positive support system. Participant is able to verbalize types and ability to  use techniques and skills needed for reducing stress and depression.  Initial Review & Psychosocial Screening:  Initial Psych Review & Screening - 02/29/20 1500      Initial Review   Current issues with None Identified      Family Dynamics   Good Support System? Yes    Comments Patient states he lives with his sister and her partner. States sister is his dual POA and greatly assists with his affiairs that require financial or medical decisions. Mr. Tesfaye feels he has a good support system and denies psychosocial barriers to participating in CR. Transportation provided by family.      Barriers   Psychosocial barriers to participate in program There are no identifiable barriers or psychosocial needs.      Screening Interventions   Interventions Encouraged to exercise           Quality of Life Scores:  Quality of Life - 03/06/20 1623      Quality of Life   Select Quality of Life      Quality of Life Scores   Health/Function Pre 22.5 %    Socioeconomic Pre 22.5 %    Psych/Spiritual Pre 22.5 %    Family Pre 22.5 %    GLOBAL Pre 22.5 %          Scores of 19 and below usually indicate a poorer quality of life in these areas.  A difference of  2-3 points is a clinically meaningful difference.  A difference of 2-3 points in the total score of the Quality of Life Index has been associated with significant improvement in overall quality of life, self-image, physical symptoms, and general health in studies assessing change in quality of life.  PHQ-9: Recent Review Flowsheet Data    Depression screen [facility] 2/9 02/29/2020   Decreased Interest 0   Down, Depressed, Hopeless 0   PHQ - 2 Score 0     Interpretation of Total Score  Total Score Depression Severity:  1-4 = Minimal depression, 5-9 = Mild depression, 10-14 = Moderate depression, 15-19 = Moderately severe depression, 20-27 = Severe depression   Psychosocial Evaluation and Intervention:  Psychosocial Evaluation - 03/06/20  1551      Psychosocial Evaluation & Interventions   Interventions Encouraged to exercise with the program and follow exercise prescription    Comments Mr. Hufstetler presents to todays cardiac rehab exercise session with a positive outlook and attitude. He does not have a history of depression or anxiety but per patient and family, he has a delusional disorder that  was diagnosed 20 years ago however patient never followed up. His sister is dual POA and helps patient manage his personal affairs. He does not allow her to manage his medical appointments and often does not follow up. Family is working on this. Mr Szymborski enjoys playing the guitar, interacting with his 4 cats, and watching TV. He uses these hobbies to manage stress.    Expected Outcomes Patient will continue to maintain a positive outlook and attitude. He will remain oriented x 4 and enjoy participating in CR.    Continue Psychosocial Services  No Follow up required           Psychosocial Re-Evaluation:  Psychosocial Re-Evaluation    [place] Name 03/28/20 0731 03/28/20 0732           Psychosocial Re-Evaluation   Current issues with None Identified --      Comments -- Mr. Basher continues to presents to cardiac rehab exercise sessions with a positive outlook and attitude. He does not have a history of depression or anxiety but per patient and family, he has a delusional disorder that was diagnosed 20 years ago however patient never followed up. His sister is dual POA and helps patient manage his personal affairs. He does not allow her to manage his medical appointments and often does not follow up. Family is working on this. Mr Moncada enjoys playing the guitar, interacting with his 4 cats, and watching TV. He uses these hobbies to manage stress.      Expected Outcomes -- Patient will continue to maintain a positive outlook and attitude. He will remain oriented x 4 and enjoy participating in CR.      Continue Psychosocial Services  -- No Follow  up required             Psychosocial Discharge (Final Psychosocial Re-Evaluation):  Psychosocial Re-Evaluation - 03/28/20 0732      Psychosocial Re-Evaluation   Comments Mr. Langille continues to presents to cardiac rehab exercise sessions with a positive outlook and attitude. He does not have a history of depression or anxiety but per patient and family, he has a delusional disorder that was diagnosed 20 years ago however patient never followed up. His sister is dual POA and helps patient manage his personal affairs. He does not allow her to manage his medical appointments and often does not follow up. Family is working on this. Mr Hoogendoorn enjoys playing the guitar, interacting with his 4 cats, and watching TV. He uses these hobbies to manage stress.    Expected Outcomes Patient will continue to maintain a positive outlook and attitude. He will remain oriented x 4 and enjoy participating in CR.    Continue Psychosocial Services  No Follow up required           Vocational Rehabilitation: Provide vocational rehab assistance to qualifying candidates.   Vocational Rehab Evaluation & Intervention:   Education: Education Goals: Education classes will be provided on a weekly basis, covering required topics. Participant will state understanding/return demonstration of topics presented.  Learning Barriers/Preferences:  Learning Barriers/Preferences - 02/29/20 1600      Learning Barriers/Preferences   Learning Barriers Sight   H/O Delusional Disorder   Learning Preferences Verbal Instruction;Pictoral;Skilled Demonstration;Audio           Education Topics: Count Your Pulse:  -Group instruction provided by verbal instruction, demonstration, patient participation and written materials to support subject.  Instructors address importance of being able to find your pulse and how to count your pulse  when at home without a heart monitor.  Patients get hands on experience counting their pulse with  staff help and individually.   Heart Attack, Angina, and Risk Factor Modification:  -Group instruction provided by verbal instruction, video, and written materials to support subject.  Instructors address signs and symptoms of angina and heart attacks.    Also discuss risk factors for heart disease and how to make changes to improve heart health risk factors.   Functional Fitness:  -Group instruction provided by verbal instruction, demonstration, patient participation, and written materials to support subject.  Instructors address safety measures for doing things around the house.  Discuss how to get up and down off the floor, how to pick things up properly, how to safely get out of a chair without assistance, and balance training.   Meditation and Mindfulness:  -Group instruction provided by verbal instruction, patient participation, and written materials to support subject.  Instructor addresses importance of mindfulness and meditation practice to help reduce stress and improve awareness.  Instructor also leads participants through a meditation exercise.    Stretching for Flexibility and Mobility:  -Group instruction provided by verbal instruction, patient participation, and written materials to support subject.  Instructors lead participants through series of stretches that are designed to increase flexibility thus improving mobility.  These stretches are additional exercise for major muscle groups that are typically performed during regular warm up and cool down.   Hands Only CPR:  -Group verbal, video, and participation provides a basic overview of AHA guidelines for community CPR. Role-play of emergencies allow participants the opportunity to practice calling for help and chest compression technique with discussion of AED use.   Hypertension: -Group verbal and written instruction that provides a basic overview of hypertension including the most recent diagnostic guidelines, risk factor  reduction with self-care instructions and medication management.    Nutrition I class: Heart Healthy Eating:  -Group instruction provided by PowerPoint slides, verbal discussion, and written materials to support subject matter. The instructor gives an explanation and review of the Therapeutic Lifestyle Changes diet recommendations, which includes a discussion on lipid goals, dietary fat, sodium, fiber, plant stanol/sterol esters, sugar, and the components of a well-balanced, healthy diet.   Nutrition II class: Lifestyle Skills:  -Group instruction provided by PowerPoint slides, verbal discussion, and written materials to support subject matter. The instructor gives an explanation and review of label reading, grocery shopping for heart health, heart healthy recipe modifications, and ways to make healthier choices when eating out.   Diabetes Question & Answer:  -Group instruction provided by PowerPoint slides, verbal discussion, and written materials to support subject matter. The instructor gives an explanation and review of diabetes co-morbidities, pre- and post-prandial blood glucose goals, pre-exercise blood glucose goals, signs, symptoms, and treatment of hypoglycemia and hyperglycemia, and foot care basics.   Diabetes Blitz:  -Group instruction provided by PowerPoint slides, verbal discussion, and written materials to support subject matter. The instructor gives an explanation and review of the physiology behind type 1 and type 2 diabetes, diabetes medications and rational behind using different medications, pre- and post-prandial blood glucose recommendations and Hemoglobin A1c goals, diabetes diet, and exercise including blood glucose guidelines for exercising safely.    Portion Distortion:  -Group instruction provided by PowerPoint slides, verbal discussion, written materials, and food models to support subject matter. The instructor gives an explanation of serving size versus portion  size, changes in portions sizes over the last 20 years, and what consists of a serving  from each food group.   Stress Management:  -Group instruction provided by verbal instruction, video, and written materials to support subject matter.  Instructors review role of stress in heart disease and how to cope with stress positively.     Exercising on Your Own:  -Group instruction provided by verbal instruction, power point, and written materials to support subject.  Instructors discuss benefits of exercise, components of exercise, frequency and intensity of exercise, and end points for exercise.  Also discuss use of nitroglycerin and activating EMS.  Review options of places to exercise outside of rehab.  Review guidelines for sex with heart disease.   Cardiac Drugs I:  -Group instruction provided by verbal instruction and written materials to support subject.  Instructor reviews cardiac drug classes: antiplatelets, anticoagulants, beta blockers, and statins.  Instructor discusses reasons, side effects, and lifestyle considerations for each drug class.   Cardiac Drugs II:  -Group instruction provided by verbal instruction and written materials to support subject.  Instructor reviews cardiac drug classes: angiotensin converting enzyme inhibitors (ACE-I), angiotensin II receptor blockers (ARBs), nitrates, and calcium channel blockers.  Instructor discusses reasons, side effects, and lifestyle considerations for each drug class.   Anatomy and Physiology of the Circulatory System:  Group verbal and written instruction and models provide basic cardiac anatomy and physiology, with the coronary electrical and arterial systems. Review of: AMI, Angina, Valve disease, Heart Failure, Peripheral Artery Disease, Cardiac Arrhythmia, Pacemakers, and the ICD.   Other Education:  -Group or individual verbal, written, or video instructions that support the educational goals of the cardiac rehab  program.   Holiday Eating Survival Tips:  -Group instruction provided by PowerPoint slides, verbal discussion, and written materials to support subject matter. The instructor gives patients tips, tricks, and techniques to help them not only survive but enjoy the holidays despite the onslaught of food that accompanies the holidays.   Knowledge Questionnaire Score:  Knowledge Questionnaire Score - 03/06/20 1623      Knowledge Questionnaire Score   Pre Score 18/24           Core Components/Risk Factors/Patient Goals at Admission:  Personal Goals and Risk Factors at Admission - 02/29/20 1630      Core Components/Risk Factors/Patient Goals on Admission    Weight Management Yes;Obesity;Weight Loss    Intervention Weight Management: Develop a combined nutrition and exercise program designed to reach desired caloric intake, while maintaining appropriate intake of nutrient and fiber, sodium and fats, and appropriate energy expenditure required for the weight goal.;Weight Management: Provide education and appropriate resources to help participant work on and attain dietary goals.;Weight Management/Obesity: Establish reasonable short term and long term weight goals.;Obesity: Provide education and appropriate resources to help participant work on and attain dietary goals.    Admit Weight 241 lb 4.8 oz (109.5 kg)    Goal Weight: Long Term 210 lb (95.3 kg)    Expected Outcomes Short Term: Continue to assess and modify interventions until short term weight is achieved;Long Term: Adherence to nutrition and physical activity/exercise program aimed toward attainment of established weight goal;Weight Loss: Understanding of general recommendations for a balanced deficit meal plan, which promotes 1-2 lb weight loss per week and includes a negative energy balance of [phone] kcal/d;Understanding recommendations for meals to include 15-35% energy as protein, 25-35% energy from fat, 35-60% energy from  carbohydrates, less than 200mg  of dietary cholesterol, 20-35 gm of total fiber daily;Understanding of distribution of calorie intake throughout the day with the consumption of 4-5 meals/snacks  Diabetes Yes    Intervention Provide education about signs/symptoms and action to take for hypo/hyperglycemia.;Provide education about proper nutrition, including hydration, and aerobic/resistive exercise prescription along with prescribed medications to achieve blood glucose in normal ranges: Fasting glucose 65-99 mg/dL    Expected Outcomes Short Term: Participant verbalizes understanding of the signs/symptoms and immediate care of hyper/hypoglycemia, proper foot care and importance of medication, aerobic/resistive exercise and nutrition plan for blood glucose control.;Long Term: Attainment of HbA1C < 7%.    Hypertension Yes    Intervention Provide education on lifestyle modifcations including regular physical activity/exercise, weight management, moderate sodium restriction and increased consumption of fresh fruit, vegetables, and low fat dairy, alcohol moderation, and smoking cessation.;Monitor prescription use compliance.    Expected Outcomes Short Term: Continued assessment and intervention until BP is < 140/2mm HG in hypertensive participants. < 130/26mm HG in hypertensive participants with diabetes, heart failure or chronic kidney disease.;Long Term: Maintenance of blood pressure at goal levels.    Lipids Yes    Intervention Provide education and support for participant on nutrition & aerobic/resistive exercise along with prescribed medications to achieve LDL 70mg , HDL >40mg .    Expected Outcomes Short Term: Participant states understanding of desired cholesterol values and is compliant with medications prescribed. Participant is following exercise prescription and nutrition guidelines.;Long Term: Cholesterol controlled with medications as prescribed, with individualized exercise RX and with personalized  nutrition plan. Value goals: LDL < 70mg , HDL > 40 mg.           Core Components/Risk Factors/Patient Goals Review:   Goals and Risk Factor Review    Row Name 03/06/20 1555 03/28/20 0733           Core Components/Risk Factors/Patient Goals Review   Personal Goals Review Weight Management/Obesity;Lipids;Diabetes;Hypertension Weight Management/Obesity;Lipids;Diabetes;Hypertension      Review Mr. Hustead has multiple CAD risk factors. Per sister, he often does not follow up with his providers and she has a hard time "making" him follow his providers medical advice. He does take all medications as prescribed and his sisters partner assist him with checking his blood sugars. He has not been wearing a boot for charcot foot however today he presents with boot on stating he is "doing what we advised him to do". His goals are to keep taking his medications, sticking with a heart healthy diabetic diet, and to loose weight. He will work with RD on nutritional goals. Mr. Kenneth has multiple CAD risk factors. Per sister, he often does not follow up with his providers and she has a hard time "making" him follow his providers medical advice. He does take all medications as prescribed and his sisters partner assist him with checking his blood sugars.  His goals are to keep taking his medications, sticking with a heart healthy diabetic diet, and to loose weight. He continues work with RD on nutritional goals.      Expected Outcomes Patient will continue to participate in CR for education and risk factor modifications. Patient will continue to participate in CR for education and risk factor modifications.             Core Components/Risk Factors/Patient Goals at Discharge (Final Review):   Goals and Risk Factor Review - 03/28/20 0733      Core Components/Risk Factors/Patient Goals Review   Personal Goals Review Weight Management/Obesity;Lipids;Diabetes;Hypertension    Review Mr. Hazelrigg has multiple CAD risk  factors. Per sister, he often does not follow up with his providers and she has a hard time "making"  him follow his providers medical advice. He does take all medications as prescribed and his sisters partner assist him with checking his blood sugars.  His goals are to keep taking his medications, sticking with a heart healthy diabetic diet, and to loose weight. He continues work with RD on nutritional goals.    Expected Outcomes Patient will continue to participate in CR for education and risk factor modifications.           ITP Comments:  ITP Comments    Row Name 02/29/20 1426 03/06/20 1547 03/28/20 0720       ITP Comments Dr. Fransico Him Medical Director Cardiac Re[facility] 30 day ITP review: Mr. Forehand completed his first cardiac rehab exercise session and tolerated very well. He denied complaints of dizziness or shortness of breath. States he has followed advise of CR staff and has not been outside walking when it is extremely hot. Mr. Septer had stated during his orientation walktest that he was experiencing dizziness when he would walk outside during the heat. MD notified. No new interventions except to not exert if temp >85 or humidity >75. VSS during todays exercise session. Worked to an RPE of 11 on the NuStep for 30 min. Will continue to monitor 30 day ITP review: Mr. Rudy has completed 9 cardiac rehab exercise sessions since admission. He was unable to sustain exercise on the NuStep secondary to foot/ankle pain from diagnosis of Charcot foot. He was moved to the Arm Ergometer where he exercises for 30 min. Mr. Bogan and his POA (sister) Pratik Dalziel have multiple cases management needs including medication assistance, meals on wheels, care coordination. CR RN has placed a referral to [facility] Case Management with patient's permission. Mr. Hanners continues to work to an RPE of 11. He is working with RD for DM education. He is extremely reliant on care from his sisters partner Pecola Lawless  whom I have communicated with several times to optimize patients potential for meeting his goals of medication adherence, and sticking with diet.            Comments: see ITP comments

## 2020-03-29 ENCOUNTER — Encounter (HOSPITAL_COMMUNITY)
Admission: RE | Admit: 2020-03-29 | Discharge: 2020-03-29 | Disposition: A | Discharge status: Home / Self Care | Payer: Medicare HMO | Source: Ambulatory Visit | Attending: Cardiology | Admitting: Cardiology

## 2020-03-29 ENCOUNTER — Other Ambulatory Visit: Payer: Self-pay

## 2020-03-29 DIAGNOSIS — I2102 ST elevation (STEMI) myocardial infarction involving left anterior descending coronary artery: Secondary | ICD-10-CM

## 2020-03-29 DIAGNOSIS — E1165 Type 2 diabetes mellitus with hyperglycemia: Secondary | ICD-10-CM | POA: Diagnosis not present

## 2020-03-29 NOTE — Progress Notes (Signed)
Nutrition Note  Spoke with pt today about carbohydrate counting, and eating a consistent amount of carbohydrates across the day. Reviewed the benefits. Showed pt how to calculate carbohydrate servings, and distributed handouts for patient to practice. Recommended pt eat 4-5 servings of carbohydrates at meals. Reviewed estimating carbs using hands/portion sizes. Discussed the importance of creating a balanced meal with the addition of protein and non-starchy vegetables. Distributed recipes and snack ideas to patient to try. Additionally discussed with patient that exercise may cause blood sugar to decrease and that we may need to add in a snack before or after workout, to manage any changes. Distributed handout of snack ideas that would provide a combination of carbohydrates and protein. Pt verbalized understanding of material discussed today. Distributed RD contact information.      Nutrition Diagnosis   Excessive carbohydrate intake related to food preferences and lack of food related knowledge as evidenced by A1C 10.0 and fasting CBGs 158-178 mg/dl   Nutrition Intervention   Pt's individual nutrition plan reviewed with pt.  Benefits of adopting Heart Healthy diet discussed when [employer] reviewed.Continue client-centered nutrition education by RD, as part of interdisciplinary care.  Goal(s)  Checking CBG daily and CBG concentrations in the normal range or as close to normal as is safely possible.  Improved blood glucose control as evidenced by pt's A1c trending from 10 toward less than 7.0.  Pt able to name foods that affect blood glucose  Plan:  Will provide client-centered nutrition education as part of interdisciplinary care  Monitor and evaluate progress toward nutrition goal with team.   Michaele Offer, MS, RDN, LDN

## 2020-03-31 ENCOUNTER — Encounter (HOSPITAL_COMMUNITY)
Admission: RE | Admit: 2020-03-31 | Discharge: 2020-03-31 | Disposition: A | Discharge status: Home / Self Care | Payer: Medicare HMO | Source: Ambulatory Visit | Attending: Cardiology | Admitting: Cardiology

## 2020-03-31 ENCOUNTER — Other Ambulatory Visit: Payer: Self-pay

## 2020-03-31 DIAGNOSIS — E1165 Type 2 diabetes mellitus with hyperglycemia: Secondary | ICD-10-CM | POA: Diagnosis not present

## 2020-03-31 DIAGNOSIS — I2102 ST elevation (STEMI) myocardial infarction involving left anterior descending coronary artery: Secondary | ICD-10-CM

## 2020-04-01 NOTE — Progress Notes (Signed)
Discharge Progress Report  Patient Details  Name: Green Green MRN: [id number] Date of Birth: [birth year] Referring Provider:     [facility] from 02/29/2020 in [facility]  Referring Provider Green Green       Number of Visits: 11  Reason for Discharge:  Early Exit:  medical instability-see narrative at bottom of note  Smoking History:  Social History   Tobacco Use  Smoking Status Never Smoker  Smokeless Tobacco Never Used    Diagnosis:  ST elevation myocardial infarction involving left anterior descending (LAD) coronary artery ([place])  Uncontrolled type 2 diabetes mellitus with hyperglycemia ([place])  ADL UCSD:   Initial Exercise Prescription:  Initial Exercise Prescription - 02/29/20 1600      Date of Initial Exercise RX and Referring Provider   Date 02/29/20    Referring Provider Green Green    Expected Discharge Date 04/28/20      NuStep   Level 1    SPM 70    Minutes 30    METs 1.9      Prescription Details   Frequency (times per week) 3    Duration Progress to 30 minutes of continuous aerobic without signs/symptoms of physical distress      Intensity   THRR 40-80% of Max Heartrate 62-123    Ratings of Perceived Exertion 11-13    Perceived Dyspnea 0-4      Progression   Progression Continue progressive overload as per policy without signs/symptoms or physical distress.      Resistance Training   Training Prescription Yes    Weight 3   Seated in chair   Reps 10-15           Discharge Exercise Prescription (Final Exercise Prescription Changes):  Exercise Prescription Changes - 03/27/20 1630      Response to Exercise   Blood Pressure (Admit) 98/54    Blood Pressure (Exercise) 130/58    Blood Pressure (Exit) 112/60    Heart Rate (Admit) 99 bpm    Heart Rate (Exercise) 103 bpm    Heart Rate (Exit) 89 bpm    Rating of Perceived Exertion (Exercise) 12    Symptoms None    Comments  Reviewed METs    Duration Progress to 30 minutes of  aerobic without signs/symptoms of physical distress    Intensity THRR unchanged      Progression   Progression Continue to progress workloads to maintain intensity without signs/symptoms of physical distress.    Average METs 2.7      Resistance Training   Training Prescription Yes    Weight 3 lbs    Reps 10-15    Time 10 Minutes      Interval Training   Interval Training No      Arm Ergometer   Level 2    Minutes 30    METs 2.7      Home Exercise Plan   Plans to continue exercise at --   Not done due to patients Charcot Foot          Functional Capacity:  6 Minute Walk    Row Name 02/29/20 1630         6 Minute Walk   Phase Initial     Distance 1378 feet  Nustep used due to pt with Dx: Charcot Foot     Walk Time 6 minutes     # of Rest Breaks 0     MPH 2.6  METS 2.76     RPE 7     Perceived Dyspnea  0     VO2 Peak 9.66     Symptoms No     Resting HR 74 bpm     Resting BP 98/56     Resting Oxygen Saturation  97 %     Exercise Oxygen Saturation  during 6 min walk 97 %     Max Ex. HR 84 bpm     Max Ex. BP 110/64     2 Minute Post BP 102/70            Psychological, QOL, Others - Outcomes: PHQ 2/9: Depression screen PHQ 2/9 02/29/2020  Decreased Interest 0  Down, Depressed, Hopeless 0  PHQ - 2 Score 0    Quality of Life:  Quality of Life - 03/06/20 1623      Quality of Life   Select Quality of Life      Quality of Life Scores   Health/Function Pre 22.5 %    Socioeconomic Pre 22.5 %    Psych/Spiritual Pre 22.5 %    Family Pre 22.5 %    GLOBAL Pre 22.5 %           Personal Goals: Goals established at orientation with interventions provided to work toward goal.  Personal Goals and Risk Factors at Admission - 02/29/20 1630      Core Components/Risk Factors/Patient Goals on Admission    Weight Management Yes;Obesity;Weight Loss    Intervention Weight Management: Develop a combined  nutrition and exercise program designed to reach desired caloric intake, while maintaining appropriate intake of nutrient and fiber, sodium and fats, and appropriate energy expenditure required for the weight goal.;Weight Management: Provide education and appropriate resources to help participant work on and attain dietary goals.;Weight Management/Obesity: Establish reasonable short term and long term weight goals.;Obesity: Provide education and appropriate resources to help participant work on and attain dietary goals.    Admit Weight 241 lb 4.8 oz (109.5 kg)    Goal Weight: Long Term 210 lb (95.3 kg)    Expected Outcomes Short Term: Continue to assess and modify interventions until short term weight is achieved;Long Term: Adherence to nutrition and physical activity/exercise program aimed toward attainment of established weight goal;Weight Loss: Understanding of general recommendations for a balanced deficit meal plan, which promotes 1-2 lb weight loss per week and includes a negative energy balance of [phone] kcal/d;Understanding recommendations for meals to include 15-35% energy as protein, 25-35% energy from fat, 35-60% energy from carbohydrates, less than 200mg  of dietary cholesterol, 20-35 gm of total fiber daily;Understanding of distribution of calorie intake throughout the day with the consumption of 4-5 meals/snacks    Diabetes Yes    Intervention Provide education about signs/symptoms and action to take for hypo/hyperglycemia.;Provide education about proper nutrition, including hydration, and aerobic/resistive exercise prescription along with prescribed medications to achieve blood glucose in normal ranges: Fasting glucose 65-99 mg/dL    Expected Outcomes Short Term: Participant verbalizes understanding of the signs/symptoms and immediate care of hyper/hypoglycemia, proper foot care and importance of medication, aerobic/resistive exercise and nutrition plan for blood glucose control.;Long Term:  Attainment of HbA1C < 7%.    Hypertension Yes    Intervention Provide education on lifestyle modifcations including regular physical activity/exercise, weight management, moderate sodium restriction and increased consumption of fresh fruit, vegetables, and low fat dairy, alcohol moderation, and smoking cessation.;Monitor prescription use compliance.    Expected Outcomes Short Term: Continued assessment and intervention until BP  is < 140/9mm HG in hypertensive participants. < 130/71mm HG in hypertensive participants with diabetes, heart failure or chronic kidney disease.;Long Term: Maintenance of blood pressure at goal levels.    Lipids Yes    Intervention Provide education and support for participant on nutrition & aerobic/resistive exercise along with prescribed medications to achieve LDL 70mg , HDL >40mg .    Expected Outcomes Short Term: Participant states understanding of desired cholesterol values and is compliant with medications prescribed. Participant is following exercise prescription and nutrition guidelines.;Long Term: Cholesterol controlled with medications as prescribed, with individualized exercise RX and with personalized nutrition plan. Value goals: LDL < 70mg , HDL > 40 mg.            Personal Goals Discharge:  Goals and Risk Factor Review    Row Name 03/06/20 1555 03/28/20 0733           Core Components/Risk Factors/Patient Goals Review   Personal Goals Review Weight Management/Obesity;Lipids;Diabetes;Hypertension Weight Management/Obesity;Lipids;Diabetes;Hypertension      Review Mr. Nieman has multiple CAD risk factors. Per sister, he often does not follow up with his providers and she has a hard time "making" him follow his providers medical advice. He does take all medications as prescribed and his sisters partner assist him with checking his blood sugars. He has not been wearing a boot for charcot foot however today he presents with boot on stating he is "doing what we  advised him to do". His goals are to keep taking his medications, sticking with a heart healthy diabetic diet, and to loose weight. He will work with RD on nutritional goals. Mr. Cumbie has multiple CAD risk factors. Per sister, he often does not follow up with his providers and she has a hard time "making" him follow his providers medical advice. He does take all medications as prescribed and his sisters partner assist him with checking his blood sugars.  His goals are to keep taking his medications, sticking with a heart healthy diabetic diet, and to loose weight. He continues work with RD on nutritional goals.      Expected Outcomes Patient will continue to participate in CR for education and risk factor modifications. Patient will continue to participate in CR for education and risk factor modifications.             Exercise Goals and Review:  Exercise Goals    Row Name 03/01/20 0813             Exercise Goals   Increase Physical Activity Yes       Intervention Provide advice, education, support and counseling about physical activity/exercise needs.;Develop an individualized exercise prescription for aerobic and resistive training based on initial evaluation findings, risk stratification, comorbidities and participant's personal goals.       Expected Outcomes Short Term: Attend rehab on a regular basis to increase amount of physical activity.;Long Term: Add in home exercise to make exercise part of routine and to increase amount of physical activity.;Long Term: Exercising regularly at least 3-5 days a week.       Increase Strength and Stamina Yes       Intervention Provide advice, education, support and counseling about physical activity/exercise needs.;Develop an individualized exercise prescription for aerobic and resistive training based on initial evaluation findings, risk stratification, comorbidities and participant's personal goals.       Expected Outcomes Short Term: Increase  workloads from initial exercise prescription for resistance, speed, and METs.;Short Term: Perform resistance training exercises routinely during rehab and add in  resistance training at home;Long Term: Improve cardiorespiratory fitness, muscular endurance and strength as measured by increased METs and functional capacity (6MWT)       Able to understand and use rate of perceived exertion (RPE) scale Yes       Intervention Provide education and explanation on how to use RPE scale       Expected Outcomes Short Term: Able to use RPE daily in rehab to express subjective intensity level;Long Term:  Able to use RPE to guide intensity level when exercising independently       Knowledge and understanding of Target Heart Rate Range (THRR) Yes       Intervention Provide education and explanation of THRR including how the numbers were predicted and where they are located for reference       Expected Outcomes Short Term: Able to state/look up THRR;Long Term: Able to use THRR to govern intensity when exercising independently;Short Term: Able to use daily as guideline for intensity in rehab       Able to check pulse independently Yes       Intervention Provide education and demonstration on how to check pulse in carotid and radial arteries.;Review the importance of being able to check your own pulse for safety during independent exercise       Expected Outcomes Short Term: Able to explain why pulse checking is important during independent exercise;Long Term: Able to check pulse independently and accurately       Understanding of Exercise Prescription Yes       Intervention Provide education, explanation, and written materials on patient's individual exercise prescription       Expected Outcomes Short Term: Able to explain program exercise prescription;Long Term: Able to explain home exercise prescription to exercise independently              Exercise Goals Re-Evaluation:  Exercise Goals Re-Evaluation    [place]  Name 03/06/20 1420             Exercise Goal Re-Evaluation   Exercise Goals Review Increase Physical Activity;Increase Strength and Stamina;Able to understand and use rate of perceived exertion (RPE) scale;Knowledge and understanding of Target Heart Rate Range (THRR);Able to check pulse independently;Understanding of Exercise Prescription       Comments Pt's first day of exercise in [facility] program. He tolerated the exercise prescription welll and understands RPE scale, THRR and Exericse Rx.       Expected Outcomes Will continue to monitor and progress patient as tolerated.              Nutrition & Weight - Outcomes:  Pre Biometrics - 02/29/20 1427      Pre Biometrics   Waist Circumference 45.5 inches    Hip Circumference 46 inches    Waist to Hip Ratio 0.99 %    Triceps Skinfold 20 mm    % Body Fat 32.2 %    Grip Strength 43 kg    Flexibility 16.5 in    Single Leg Stand --   Not done due to charcot foot           Nutrition:  Nutrition Therapy & Goals - 03/15/20 1442      Nutrition Therapy   Diet [place] Healthy/carb modified      Personal Nutrition Goals   Nutrition Goal Improved blood glucose control as evidenced by pt's A1c trending from 10 toward less than 7.0.    Personal Goal #2 Checking CBG daily and CBG concentrations in the normal range or  as close to normal as is safely possible    Personal Goal #3 Pt able to name foods that affect blood glucose      Intervention Plan   Intervention Prescribe, educate and counsel regarding individualized specific dietary modifications aiming towards targeted core components such as weight, hypertension, lipid management, diabetes, heart failure and other comorbidities.;Nutrition handout(s) given to patient.    Expected Outcomes Short Term Goal: A plan has been developed with personal nutrition goals set during dietitian appointment.;Long Term Goal: Adherence to prescribed nutrition plan.           Nutrition Discharge:   Nutrition Assessments - 03/15/20 1443      MEDFICTS Scores   Pre Score 32           Education Questionnaire Score:  Knowledge Questionnaire Score - 03/06/20 1623      Knowledge Questionnaire Score   Pre Score 18/24           Mr. Dimarzo was [facility] from [facility] after completing 11 exercise sessions. Mr. Swigert has a history of Charcot foot and complainted exercising on the Nustep made his ankle "hurt". He was then moved to Recumbent bike however that caused too much left knee discomfort. Subsequently he was placed on the Arm Ergometer for 30 min however stated 03/31/20 the Arm Ergometer was too difficult for him to do for 30 min which is the required time for CR exercise. He wished to return to nustep with open toed orthopedic boot however open toes are not allowed per hospital/department protocol. Discussed with sister Magda Paganini who tried to obtained a closed toed boot from medical provider. Unfortunately she was told to order one from [facility] or wait until patient is seen by podiatry. Patient recently referred to podiatry for R foot wound but has not recieved appointment to date. All alternatives for exercise have been exhausted. Patient agrees to discharge.

## 2020-04-03 ENCOUNTER — Encounter (HOSPITAL_COMMUNITY): Payer: Medicare HMO

## 2020-04-05 ENCOUNTER — Encounter (HOSPITAL_COMMUNITY): Payer: Medicare HMO

## 2020-04-07 ENCOUNTER — Encounter (HOSPITAL_COMMUNITY): Payer: Medicare HMO

## 2020-04-10 ENCOUNTER — Encounter: Payer: Medicare HMO | Attending: Family Medicine | Admitting: Dietician

## 2020-04-10 ENCOUNTER — Other Ambulatory Visit: Payer: Self-pay

## 2020-04-10 ENCOUNTER — Encounter: Payer: Self-pay | Admitting: Dietician

## 2020-04-10 ENCOUNTER — Encounter (HOSPITAL_COMMUNITY): Payer: Medicare HMO

## 2020-04-10 DIAGNOSIS — E1165 Type 2 diabetes mellitus with hyperglycemia: Secondary | ICD-10-CM | POA: Insufficient documentation

## 2020-04-10 DIAGNOSIS — E7800 Pure hypercholesterolemia, unspecified: Secondary | ICD-10-CM | POA: Diagnosis present

## 2020-04-10 NOTE — Patient Instructions (Addendum)
Great job getting off of tobacco!  Find ways to increase your vegetable intake! Drink the LS V-8 rather than orange juice. Use milk rather than cream in your coffee. Portion size 1/2 cup for ice cream or consider not buying this  Avoid snacking when not hungry. Serve a portion of a snack and enjoy!  Aim for 3-4 Carb Choices per meal (45-60 grams) +/- 1 either way  Aim for 0-1 Carbs per snack if hungry  Include protein in moderation with your meals and snacks Consider reading food labels for Total Carbohydrate of foods Consider  increasing your activity level by using the stationary bike for 10 minutes daily as tolerated.  Increase this slowly as tolerated. Consider checking BG at alternate times per day  Consider taking medication as directed by MD

## 2020-04-10 NOTE — Progress Notes (Signed)
Diabetes Self-Management Education  Visit Type: First/Initial  Appt. Start Time: 0915 Appt. End Time: 1030  04/10/2020  [name], identified by name and date of birth, is a 69 y.o. male with a diagnosis of Diabetes: Type 2.   ASSESSMENT Patient is here today with a friend [name] due to their request. He states that prior to his MI he was eating increased amounts of fast food, increased crackers, peanut butter, ice cream and he was not taking his medication.   History includes Type 2 Diabetes hypothyroidism, hypercholesterolemia, HTN, MI 01/09/2020 with stents and neuropathy.  He states that he currently has Charcot foot on left, wound on right toe being treated. Labs noted to include:  10% 01/10/2020, Cholesterol 113, Triglycerides 90, HDL 41, LDL 54 03/03/2020. Medications include Jardiance and Metformin  He has not checked his blood sugar in a week.  States that he plays guitar and does not like for his fingers to hurt.  Weight hx: 240 lbs today 265 lbs 01/10/2020 175 lbs lowest adult weight.   300 lbs highest adult weight.  Patient lives with his sister and her girlfriend.  They share shopping and cooking.  His sister does most of the shopping and [name] cooks most often.  She does not add salt to her foods.  Meats are lean.  He does not feel safe to drive on his current medication. He is on disability.  He worked dock work, yard work, Environmental manager jobs.  Height 6\' 1"  (1.854 m), weight 240 lb (108.9 kg). Body mass index is 31.66 kg/m.   Diabetes Self-Management Education - 04/10/20 0937      Visit Information   Visit Type First/Initial      Initial Visit   Diabetes Type Type 2    Are you currently following a meal plan? No    Are you taking your medications as prescribed? Yes    Date Diagnosed 2013      Health Coping   How would you rate your overall health? Good      Psychosocial Assessment   Patient Belief/Attitude about Diabetes Motivated to manage diabetes     Self-care barriers Debilitated state due to current medical condition    Self-management support Doctor's office;Friends;Family    Other persons present Patient;Friend    Patient Concerns Nutrition/Meal planning;Glycemic Control;Weight Control    Special Needs None    Preferred Learning Style No preference indicated    Learning Readiness Ready    How often do you need to have someone help you when you read instructions, pamphlets, or other written materials from your doctor or pharmacy? 5 - Always    What is the last grade level you completed in school? 12      Pre-Education Assessment   Patient understands the diabetes disease and treatment process. Needs Instruction    Patient understands incorporating nutritional management into lifestyle. Needs Instruction    Patient undertands incorporating physical activity into lifestyle. Needs Instruction    Patient understands using medications safely. Needs Instruction    Patient understands monitoring blood glucose, interpreting and using results Needs Instruction    Patient understands prevention, detection, and treatment of acute complications. Needs Instruction    Patient understands prevention, detection, and treatment of chronic complications. Needs Instruction    Patient understands how to develop strategies to address psychosocial issues. Needs Instruction    Patient understands how to develop strategies to promote health/change behavior. Needs Instruction      Complications   Last HgB  A1C per patient/outside source 10 %   01/10/2020   How often do you check your blood sugar? 0 times/day (not testing)   hasn't checked his blood sugar in a week   Fasting Blood glucose range (mg/dL) 130-179    Number of hypoglycemic episodes per month 0    Have you had a dilated eye exam in the past 12 months? No   appointment in September   Have you had a dental exam in the past 12 months? No    Are you checking your feet? Yes    How many days per week  are you checking your feet? 7      Dietary Intake   Breakfast cereal (bran flakes or plain cheerios), 2% milk    Snack (morning) occasional fiber bar    Lunch tomato sandwich OR chips and salsa    Snack (afternoon) PB& sugar free jelly on [employer] bread    Dinner very lean hamburger casserole with noodles    Snack (evening) carb smart chocolate ice cream (2 cups)    Beverage(s) water, OJ,occasional diet soda, coffee with a lot of cream      Exercise   Exercise Type Light (walking / raking leaves)   stationary bike   How many days per week to you exercise? 2    How many minutes per day do you exercise? 5    Total minutes per week of exercise 10      Patient Education   Previous Diabetes Education No    Disease state  Definition of diabetes, type 1 and 2, and the diagnosis of diabetes    Nutrition management  Role of diet in the treatment of diabetes and the relationship between the three main macronutrients and blood glucose level;Meal options for control of blood glucose level and chronic complications.;Food label reading, portion sizes and measuring food.    Physical activity and exercise  Role of exercise on diabetes management, blood pressure control and cardiac health.;Helped patient identify appropriate exercises in relation to his/her diabetes, diabetes complications and other health issue.    Medications Reviewed patients medication for diabetes, action, purpose, timing of dose and side effects.    Monitoring Taught/discussed recording of test results and interpretation of SMBG.;Identified appropriate SMBG and/or A1C goals.;Yearly dilated eye exam;Daily foot exams   Discussed option of testing in his hand rather than finger.   Acute complications Taught treatment of hypoglycemia - the 15 rule.;Discussed and identified patients' treatment of hyperglycemia.    Chronic complications Relationship between chronic complications and blood glucose control;Identified and discussed with patient   current chronic complications    Psychosocial adjustment Worked with patient to identify barriers to care and solutions;Role of stress on diabetes      Individualized Goals (developed by patient)   Nutrition General guidelines for healthy choices and portions discussed    Physical Activity Exercise 5-7 days per week;15 minutes per day    Medications take my medication as prescribed    Monitoring  test my blood glucose as discussed    Reducing Risk increase portions of healthy fats;examine blood glucose patterns;get labs drawn;do foot checks daily    Health Coping discuss diabetes with (comment)   MD, RD, CDCES     Post-Education Assessment   Patient understands the diabetes disease and treatment process. Demonstrates understanding / competency    Patient understands incorporating nutritional management into lifestyle. Needs Review    Patient undertands incorporating physical activity into lifestyle. Needs Review    Patient  understands using medications safely. Demonstrates understanding / competency    Patient understands monitoring blood glucose, interpreting and using results Needs Review    Patient understands prevention, detection, and treatment of acute complications. Demonstrates understanding / competency    Patient understands prevention, detection, and treatment of chronic complications. Demonstrates understanding / competency    Patient understands how to develop strategies to address psychosocial issues. Demonstrates understanding / competency    Patient understands how to develop strategies to promote health/change behavior. Needs Review      Outcomes   Expected Outcomes Demonstrated interest in learning. Expect positive outcomes    Future DMSE 4-6 wks    Program Status Not Completed           Individualized Plan for Diabetes Self-Management Training:   Learning Objective:  Patient will have a greater understanding of diabetes self-management. Patient education plan is  to attend individual and/or group sessions per assessed needs and concerns.   Plan:   Patient Instructions  [employer] job getting off of tobacco!  Find ways to increase your vegetable intake! Drink the LS V-8 rather than orange juice. Use milk rather than cream in your coffee. Portion size 1/2 cup for ice cream or consider not buying this  Avoid snacking when not hungry. Serve a portion of a snack and enjoy!  Aim for 3-4 Carb Choices per meal (45-60 grams) +/- 1 either way  Aim for 0-1 Carbs per snack if hungry  Include protein in moderation with your meals and snacks Consider reading food labels for Total Carbohydrate of foods Consider  increasing your activity level by using the stationary bike for 10 minutes daily as tolerated.  Increase this slowly as tolerated. Consider checking BG at alternate times per day  Consider taking medication as directed by MD      Expected Outcomes:  Demonstrated interest in learning. Expect positive outcomes  Education material provided: ADA - How to Thrive: A Guide for Your Journey with Diabetes, Meal plan card and Snack sheet; types of fats, my plate placemat  If problems or questions, patient to contact team via:  Phone  Future DSME appointment: 4-6 wks

## 2020-04-12 ENCOUNTER — Encounter (HOSPITAL_COMMUNITY): Payer: Medicare HMO

## 2020-04-14 ENCOUNTER — Encounter (HOSPITAL_COMMUNITY): Payer: Medicare HMO

## 2020-04-17 ENCOUNTER — Encounter (HOSPITAL_COMMUNITY): Payer: Medicare HMO

## 2020-04-19 ENCOUNTER — Encounter (HOSPITAL_COMMUNITY): Payer: Medicare HMO

## 2020-04-21 ENCOUNTER — Encounter (HOSPITAL_COMMUNITY): Payer: Medicare HMO

## 2020-04-25 ENCOUNTER — Ambulatory Visit (HOSPITAL_COMMUNITY): Payer: Medicare HMO | Attending: Cardiology

## 2020-04-25 ENCOUNTER — Other Ambulatory Visit: Payer: Self-pay

## 2020-04-25 DIAGNOSIS — I5042 Chronic combined systolic (congestive) and diastolic (congestive) heart failure: Secondary | ICD-10-CM | POA: Diagnosis not present

## 2020-04-25 LAB — ECHOCARDIOGRAM COMPLETE
Area-P 1/2: 2.2 cm2
S' Lateral: 3.2 cm

## 2020-04-25 NOTE — Progress Notes (Signed)
Cardiology Office Note   Date:  04/27/2020   ID:  [name], DOB [birth year], MRN [id number]  PCP:  [name], MD  Cardiologist:  Sharlize Hoar [name], MD EP: None  Chief Complaint  Patient presents with  . Coronary Artery Disease      History of Present Illness: [name] is a 69 y.o. male with PMH of CAD s/p  STEMI in May 2021 with PCI/DES to LAD, HTN, HLD, DM type 2, and obesity, who presents for follow-up.  He was admitted to the hospital from 01/09/20-01/12/20 after presenting with chest pain and belching. On EMS arrival he was noted to have STE in anterior leads and was taken emergently the the cath lab. LHC showed 100% p-mLAD stenosis managed with PCI/DES with 75% 1st diagonal stenosis which was medically managed, otherwise no significant disease. Echo showed EF 40-45%, moderate concentric LVH, G1DD, no RWMA, and no significant valvular abnormalities. He was discharged home on aspirin and brilinta, in addition to carvedilol, losartan, and atorvastatin.   On follow up today he is doing well. Notes some lightheadedness after taking his meds but it goes away. No chest pain or dyspnea. No edema. Quit taking Jardiance this week because it was too expensive. Reports last A1c down to 8.4%. was 11%. Being treated by podiatry for a large ulcer on his right great toe. Told he has bone spurs.   Past Medical History:  Diagnosis Date  . Allergy   . Bilateral carpal tunnel syndrome 10/19/2019  . Chronic kidney disease   . Delusional disorder ([place])   . Diabetes type 2, uncontrolled ([place]) 03/26/2017    Past Surgical History:  Procedure Laterality Date  . CORONARY STENT INTERVENTION N/A 01/10/2020   Procedure: CORONARY STENT INTERVENTION;  Surgeon: [name], Mitchell Iwanicki M, MD;  Location: [facility];  Service: Cardiovascular;  Laterality: N/A;  . CORONARY/GRAFT ACUTE MI REVASCULARIZATION N/A 01/10/2020   Procedure: Coronary/Graft Acute MI Revascularization;  Surgeon: [name], Allina Riches M, MD;   Location: [facility];  Service: Cardiovascular;  Laterality: N/A;  . KNEE SURGERY Left   . LEFT HEART CATH AND CORONARY ANGIOGRAPHY N/A 01/10/2020   Procedure: LEFT HEART CATH AND CORONARY ANGIOGRAPHY;  Surgeon: [name], Silvana Holecek M, MD;  Location: [facility];  Service: Cardiovascular;  Laterality: N/A;  . TONSILLECTOMY Bilateral      Current Outpatient Medications  Medication Sig Dispense Refill  . aspirin 81 MG chewable tablet Chew 1 tablet (81 mg total) by mouth daily. 30 tablet 11  . atorvastatin (LIPITOR) 80 MG tablet Take 1 tablet (80 mg total) by mouth daily. 30 tablet 11  . carvedilol (COREG) 6.25 MG tablet Take 1 tablet (6.25 mg total) by mouth 2 (two) times daily with a meal. 60 tablet 11  . dapagliflozin propanediol (FARXIGA) 10 MG TABS tablet Take 1 tablet (10 mg total) by mouth daily before breakfast. 30 tablet 11  . ketoconazole (NIZORAL) 2 % cream ketoconazole 2 % topical cream    . losartan (COZAAR) 25 MG tablet Take 1 tablet (25 mg total) by mouth daily. 30 tablet 11  . metFORMIN (GLUCOPHAGE) 500 MG tablet Take 1 tablet (500 mg total) by mouth 2 (two) times daily with a meal. (Patient taking differently: Take 1,000 mg by mouth daily with breakfast. )    . Multiple Vitamin (MULTIVITAMIN) tablet Take 1 tablet by mouth daily.    . nitroGLYCERIN (NITROSTAT) 0.4 MG SL tablet Place 1 tablet (0.4 mg total) under the tongue  every 5 (five) minutes x 3 doses as needed for chest pain. (Patient not taking: Reported on 04/10/2020) 25 tablet 1  . Psyllium (METAMUCIL FIBER PO) Take by mouth.    . ticagrelor (BRILINTA) 90 MG TABS tablet Take 1 tablet (90 mg total) by mouth 2 (two) times daily. 90 tablet 3  . VITAMIN E PO Take 1 tablet by mouth daily. (Patient not taking: Reported on 04/10/2020)     No current facility-administered medications for this visit.    Allergies:   Achromycin [tetracycline] and Januvia [sitagliptin]    Social History:  The patient  reports that he has  never smoked. He has never used smokeless tobacco. He reports that he does not drink alcohol and does not use drugs.   Family History:  The patient's family history is not on file.    ROS:  Please see the history of present illness.   Otherwise, review of systems are positive for none.   All other systems are reviewed and negative.    PHYSICAL EXAM: VS:  BP 102/60   Pulse 79   Ht 6\' 1"  (1.854 m)   Wt 241 lb 3.2 oz (109.4 kg)   SpO2 96%   BMI 31.82 kg/m  , BMI Body mass index is 31.82 kg/m. GEN: Well nourished, well developed, in no acute distress HEENT: sclera anicteric Neck: no JVD, carotid bruits, or masses Cardiac: RRR; no murmurs, rubs, or gallops, no edema  Respiratory:  clear to auscultation bilaterally, normal work of breathing GI: soft, nontender, nondistended, + BS NL:[id number] right great toe.  Skin: warm and dry, no rash Neuro:  Strength and sensation are intact Psych: euthymic mood, full affect   EKG:  EKG is not ordered today.   Recent Labs: 01/10/2020: ALT 35; Hemoglobin 15.3; Platelets 260 03/03/2020: BUN 17; Creatinine, Ser 0.88; Potassium 4.4; Sodium 134    Lipid Panel    Component Value Date/Time   CHOL 113 03/03/2020 0902   TRIG 90 03/03/2020 0902   HDL 41 03/03/2020 0902   CHOLHDL 2.8 03/03/2020 0902   CHOLHDL 4.1 01/10/2020 0000   VLDL 19 01/10/2020 0000   LDLCALC 54 03/03/2020 0902    Dated 04/18/20: A1c 8.4%. glucose 194.   Wt Readings from Last 3 Encounters:  04/27/20 241 lb 3.2 oz (109.4 kg)  04/10/20 240 lb (108.9 kg)  02/29/20 241 lb 6.5 oz (109.5 kg)      Other studies Reviewed: Additional studies/ records that were reviewed today include:   Coronary/Graft Acute MI Revascularization  CORONARY STENT INTERVENTION  LEFT HEART CATH AND CORONARY ANGIOGRAPHY  Conclusion    Prox LAD to Mid LAD lesion is 100% stenosed.  1st Diag lesion is 75% stenosed.  Post intervention, there is a 0% residual stenosis.  A drug-eluting  stent was successfully placed using a SYNERGY XD 3.50X20.  LV end diastolic pressure is normal.  1. Single vessel occlusive CAD. 100% proximal to mid LAD. 75% first diagonal 2. Normal LVEDP 15 mm Hg 3. Successful PCI of the LAD with DES x 1  Plan: DAPT for one year. Risk factor modification. Will assess LV function with Echo.    Diagnostic Dominance: Co-dominant  Intervention     Echo 01/10/20 1. Left ventricular ejection fraction, by estimation, is 40 to 45%. The  left ventricle has mildly decreased function. The left ventricle has no  regional wall motion abnormalities. There is moderate concentric left  ventricular hypertrophy. Left  ventricular diastolic parameters are consistent with Grade I diastolic  dysfunction (impaired relaxation).  2. Right ventricular systolic function is normal. The right ventricular  size is normal.  3. The mitral valve is grossly normal. No evidence of mitral valve  regurgitation.  4. The aortic valve is normal in structure. Aortic valve regurgitation is  not visualized. No aortic stenosis is present.  5. IVC measures 2.53cm.      ASSESSMENT AND PLAN:  1. CAD s/p  STEMI with PCI/DES to LAD 01/09/20: -no recurrent chest pain - Continue aspirin and brilinta - anticipate 1 year uninterrupted therapy.  - Continue carvedilol - Continue atorvastatin  2. Chronic combined CHF/ischemic cardiomyopathy: EF 40-45% with G1DD on echo 12/2019 - appears euvolemic - Continue carvedilol and losartan - Continue to encourage daily weights and low salt diet - Repeat Echo showed improvement in EF to 50-55% with some wall motion abnormality.  - Encouraged low sodium diet and daily weights.   3. HTN: BP well controlled today  4. HLD: LDL 100 01/10/20; goal <70 - last LDL 54 on lipitor.  - Continue atorvastatin  5. DM type 2: A1C 8.4%. on metformin and Jardiance. Unable to afford Jardiance. Will check with pharmacy and see if [facility] has better  coverage. If not will need to follow up with PCP.   6. Ulceration of right great toe. He does have palpable pulses but given his high risk will check arterial dopplers.     Disposition:   FU with Dr. [name] in 4 months,    Signed, Gladine Plude [name], MD  04/27/2020 2:02 PM

## 2020-04-26 ENCOUNTER — Encounter (HOSPITAL_COMMUNITY): Payer: Medicare HMO

## 2020-04-27 ENCOUNTER — Ambulatory Visit: Payer: Medicare HMO | Admitting: Cardiology

## 2020-04-27 ENCOUNTER — Other Ambulatory Visit: Payer: Self-pay

## 2020-04-27 ENCOUNTER — Encounter: Payer: Self-pay | Admitting: Cardiology

## 2020-04-27 VITALS — BP 102/60 | HR 79 | Ht 73.0 in | Wt 241.2 lb

## 2020-04-27 DIAGNOSIS — I5042 Chronic combined systolic (congestive) and diastolic (congestive) heart failure: Secondary | ICD-10-CM

## 2020-04-27 DIAGNOSIS — E7800 Pure hypercholesterolemia, unspecified: Secondary | ICD-10-CM

## 2020-04-27 DIAGNOSIS — E119 Type 2 diabetes mellitus without complications: Secondary | ICD-10-CM

## 2020-04-27 DIAGNOSIS — I10 Essential (primary) hypertension: Secondary | ICD-10-CM

## 2020-04-27 DIAGNOSIS — I2510 Atherosclerotic heart disease of native coronary artery without angina pectoris: Secondary | ICD-10-CM | POA: Diagnosis not present

## 2020-04-27 DIAGNOSIS — L97519 Non-pressure chronic ulcer of other part of right foot with unspecified severity: Secondary | ICD-10-CM

## 2020-04-27 MED ORDER — DAPAGLIFLOZIN PROPANEDIOL 10 MG PO TABS: 10.0000 mg | ORAL_TABLET | Freq: Every day | ORAL | 11 refills | Status: DC

## 2020-04-27 NOTE — Patient Instructions (Signed)
If affordable we will try Farxiga 10 mg daily. Check with your pharmacy to see if this is affordable.  We will schedule dopplers to check the circulation in your legs.   Follow up in 4 months

## 2020-04-28 ENCOUNTER — Encounter (HOSPITAL_COMMUNITY): Payer: Medicare HMO

## 2020-05-01 ENCOUNTER — Other Ambulatory Visit: Payer: Self-pay | Admitting: Podiatry

## 2020-05-01 DIAGNOSIS — M14672 Charcot's joint, left ankle and foot: Secondary | ICD-10-CM

## 2020-05-15 ENCOUNTER — Encounter (HOSPITAL_COMMUNITY): Payer: Medicare HMO

## 2020-05-18 ENCOUNTER — Other Ambulatory Visit: Payer: Self-pay | Admitting: Cardiology

## 2020-05-18 DIAGNOSIS — E119 Type 2 diabetes mellitus without complications: Secondary | ICD-10-CM

## 2020-05-18 DIAGNOSIS — L97519 Non-pressure chronic ulcer of other part of right foot with unspecified severity: Secondary | ICD-10-CM

## 2020-05-18 DIAGNOSIS — I10 Essential (primary) hypertension: Secondary | ICD-10-CM

## 2020-05-21 ENCOUNTER — Ambulatory Visit
Admission: RE | Admit: 2020-05-21 | Discharge: 2020-05-21 | Disposition: A | Discharge status: Home / Self Care | Payer: Medicare HMO | Source: Ambulatory Visit | Attending: Podiatry | Admitting: Podiatry

## 2020-05-21 ENCOUNTER — Other Ambulatory Visit: Payer: Self-pay

## 2020-05-21 DIAGNOSIS — M14672 Charcot's joint, left ankle and foot: Secondary | ICD-10-CM

## 2020-05-23 ENCOUNTER — Ambulatory Visit: Payer: Medicare HMO | Admitting: Dietician

## 2020-06-05 ENCOUNTER — Ambulatory Visit (HOSPITAL_COMMUNITY): Payer: Medicare HMO

## 2020-06-21 ENCOUNTER — Telehealth: Payer: Self-pay | Admitting: Cardiology

## 2020-06-21 NOTE — Telephone Encounter (Signed)
Patient called to cancel LE Arterial Doppler, as he states he had it completed on 05/11/20 with [facility]. He states [facility] faxed results to our office a few weeks back. Confirmed fax number. Please return call to discuss results.

## 2020-06-21 NOTE — Telephone Encounter (Signed)
Spoke with the patient. No scanned LE arterial duplex in symptoms. He will have it faxed again. Fax # [phone] given.

## 2020-06-22 ENCOUNTER — Ambulatory Visit (HOSPITAL_COMMUNITY)
Admission: RE | Admit: 2020-06-22 | Payer: Medicare HMO | Source: Ambulatory Visit | Attending: Cardiology | Admitting: Cardiology

## 2020-08-24 DIAGNOSIS — M205X2 Other deformities of toe(s) (acquired), left foot: Secondary | ICD-10-CM | POA: Diagnosis not present

## 2020-08-24 DIAGNOSIS — L84 Corns and callosities: Secondary | ICD-10-CM | POA: Diagnosis not present

## 2020-08-24 DIAGNOSIS — L602 Onychogryphosis: Secondary | ICD-10-CM | POA: Diagnosis not present

## 2020-08-24 DIAGNOSIS — L97512 Non-pressure chronic ulcer of other part of right foot with fat layer exposed: Secondary | ICD-10-CM | POA: Diagnosis not present

## 2020-08-24 DIAGNOSIS — M19072 Primary osteoarthritis, left ankle and foot: Secondary | ICD-10-CM | POA: Diagnosis not present

## 2020-08-24 DIAGNOSIS — I739 Peripheral vascular disease, unspecified: Secondary | ICD-10-CM | POA: Diagnosis not present

## 2020-08-24 DIAGNOSIS — M898X9 Other specified disorders of bone, unspecified site: Secondary | ICD-10-CM | POA: Diagnosis not present

## 2020-08-28 NOTE — Progress Notes (Signed)
Cardiology Office Note   Date:  08/31/2020   ID:  [name], DOB December 06, 1950, MRN [id number]  PCP:  [name], MD  Cardiologist:  Everard Interrante [name], MD EP: None  Chief Complaint  Patient presents with   Coronary Artery Disease      History of Present Illness: [name] is a 70 y.o. male with PMH of CAD s/p  STEMI in May 2021 with PCI/DES to LAD, HTN, HLD, DM type 2, and obesity, who presents for follow-up.  He was admitted to the hospital from 01/09/20-01/12/20 after presenting with chest pain and belching. On EMS arrival he was noted to have STE in anterior leads and was taken emergently the the cath lab. LHC showed 100% p-mLAD stenosis managed with PCI/DES with 75% 1st diagonal stenosis which was medically managed, otherwise no significant disease. Echo showed EF 40-45%, moderate concentric LVH, G1DD, no RWMA, and no significant valvular abnormalities. He was discharged home on aspirin and brilinta, in addition to carvedilol, losartan, and atorvastatin.   On follow up today he is doing well.  No chest pain or dyspnea. No edema. He is riding his [employer] bike regularly. Is on Jardiance. Has run out of metformin. Last A1c up to 8.8%.  Being treated by podiatry for a large ulcer on his right great toe. LE arterial dopplers negative for PAD. He was switched from lipitor to Crestor. Did initially lose down to 140 lbs but is back up to 155 lbs.   Past Medical History:  Diagnosis Date   Allergy    Bilateral carpal tunnel syndrome 10/19/2019   Chronic kidney disease    Delusional disorder ([place])    Diabetes type 2, uncontrolled ([place]) 03/26/2017    Past Surgical History:  Procedure Laterality Date   CORONARY STENT INTERVENTION N/A 01/10/2020   Procedure: CORONARY STENT INTERVENTION;  Surgeon: Green, Helaina Stefano M, MD;  Location: [facility];  Service: Cardiovascular;  Laterality: N/A;   CORONARY/GRAFT ACUTE MI REVASCULARIZATION N/A 01/10/2020   Procedure: Coronary/Graft  Acute MI Revascularization;  Surgeon: Green, Jovanka Westgate M, MD;  Location: [facility];  Service: Cardiovascular;  Laterality: N/A;   KNEE SURGERY Left    LEFT HEART CATH AND CORONARY ANGIOGRAPHY N/A 01/10/2020   Procedure: LEFT HEART CATH AND CORONARY ANGIOGRAPHY;  Surgeon: Green, Melizza Kanode M, MD;  Location: [facility];  Service: Cardiovascular;  Laterality: N/A;   TONSILLECTOMY Bilateral      Current Outpatient Medications  Medication Sig Dispense Refill   aspirin 81 MG chewable tablet Chew 1 tablet (81 mg total) by mouth daily. 30 tablet 11   carvedilol (COREG) 6.25 MG tablet Take 1 tablet (6.25 mg total) by mouth 2 (two) times daily with a meal. 60 tablet 11   empagliflozin (JARDIANCE) 10 MG TABS tablet Take 1 tablet (10 mg total) by mouth daily before breakfast. 30 tablet    ketoconazole (NIZORAL) 2 % cream ketoconazole 2 % topical cream     losartan (COZAAR) 25 MG tablet Take 1 tablet (25 mg total) by mouth daily. 30 tablet 11   metFORMIN (GLUCOPHAGE) 500 MG tablet Take 1 tablet (500 mg total) by mouth 2 (two) times daily with a meal. (Patient taking differently: Take 1,000 mg by mouth daily with breakfast.)     Multiple Vitamin (MULTIVITAMIN) tablet Take 1 tablet by mouth daily.     nitroGLYCERIN (NITROSTAT) 0.4 MG SL tablet Place 1 tablet (0.4 mg total) under the tongue every 5 (five) minutes x 3  doses as needed for chest pain. 25 tablet 1   Psyllium (METAMUCIL FIBER PO) Take by mouth.     rosuvastatin (CRESTOR) 20 MG tablet Take 1 tablet (20 mg total) by mouth daily. 90 tablet 3   ticagrelor (BRILINTA) 90 MG TABS tablet Take 1 tablet (90 mg total) by mouth 2 (two) times daily. 90 tablet 3   VITAMIN E PO Take 1 tablet by mouth daily.     No current facility-administered medications for this visit.    Allergies:   Achromycin [tetracycline] and Januvia [sitagliptin]    Social History:  The patient  reports that he has never smoked. He has never used smokeless  tobacco. He reports that he does not drink alcohol and does not use drugs.   Family History:  The patient's family history is not on file.    ROS:  Please see the history of present illness.   Otherwise, review of systems are positive for none.   All other systems are reviewed and negative.    PHYSICAL EXAM: VS:  BP 125/87    Pulse 82    Ht 6\' 1"  (1.854 m)    Wt 255 lb (115.7 kg)    SpO2 96%    BMI 33.64 kg/m  , BMI Body mass index is 33.64 kg/m. GEN: Well nourished, well developed, in no acute distress HEENT: sclera anicteric Neck: no JVD, carotid bruits, or masses Cardiac: RRR; no murmurs, rubs, or gallops, no edema  Respiratory:  clear to auscultation bilaterally, normal work of breathing GI: soft, nontender, nondistended, + BS YS:[id number] right great toe.  Skin: warm and dry, no rash. Ulcerated lesion on right great toe.  Neuro:  Strength and sensation are intact Psych: euthymic mood, full affect   EKG:  EKG is not ordered today.   Recent Labs: 01/10/2020: ALT 35; Hemoglobin 15.3; Platelets 260 03/03/2020: BUN 17; Creatinine, Ser 0.88; Potassium 4.4; Sodium 134    Lipid Panel    Component Value Date/Time   CHOL 113 03/03/2020 0902   TRIG 90 03/03/2020 0902   HDL 41 03/03/2020 0902   CHOLHDL 2.8 03/03/2020 0902   CHOLHDL 4.1 01/10/2020 0000   VLDL 19 01/10/2020 0000   LDLCALC 54 03/03/2020 0902    Dated 04/18/20: A1c 8.4%. glucose 194.  Dated 07/19/20: A1c 8.8%. glucose 206. CMET otherwise normal.  Wt Readings from Last 3 Encounters:  08/31/20 255 lb (115.7 kg)  04/27/20 241 lb 3.2 oz (109.4 kg)  04/10/20 240 lb (108.9 kg)      Other studies Reviewed: Additional studies/ records that were reviewed today include:   Coronary/Graft Acute MI Revascularization  CORONARY STENT INTERVENTION  LEFT HEART CATH AND CORONARY ANGIOGRAPHY  Conclusion    Prox LAD to Mid LAD lesion is 100% stenosed.  1st Diag lesion is 75% stenosed.  Post intervention, there is a  0% residual stenosis.  A drug-eluting stent was successfully placed using a SYNERGY XD [id number].  LV end diastolic pressure is normal.  1. Single vessel occlusive CAD. 100% proximal to mid LAD. 75% first diagonal 2. Normal LVEDP 15 mm Hg 3. Successful PCI of the LAD with DES x 1  Plan: DAPT for one year. Risk factor modification. Will assess LV function with Echo.    Diagnostic Dominance: Co-dominant  Intervention     Echo 01/10/20 1. Left ventricular ejection fraction, by estimation, is 40 to 45%. The  left ventricle has mildly decreased function. The left ventricle has no  regional wall motion abnormalities. There  is moderate concentric left  ventricular hypertrophy. Left  ventricular diastolic parameters are consistent with Grade I diastolic  dysfunction (impaired relaxation).  2. Right ventricular systolic function is normal. The right ventricular  size is normal.  3. The mitral valve is grossly normal. No evidence of mitral valve  regurgitation.  4. The aortic valve is normal in structure. Aortic valve regurgitation is  not visualized. No aortic stenosis is present.  5. IVC measures 2.53cm.    Echo 04/25/20:1. Left ventricular ejection fraction, by estimation, is 50 to 55%. The left ventricle has low normal function. The left ventricle demonstrates regional wall motion abnormalities with mid anteroseptal and mid inferoseptal hypokinesis. Left ventricular diastolic parameters are consistent with Grade I diastolic dysfunction (impaired relaxation). 2. Right ventricular systolic function is normal. The right ventricular size is normal. Tricuspid regurgitation signal is inadequate for assessing PA pressure. 3. The mitral valve is normal in structure. Trivial mitral valve regurgitation. No evidence of mitral stenosis. 4. The aortic valve is tricuspid. Aortic valve regurgitation is trivial. No aortic stenosis is present. 5. Aortic dilatation noted. There is mild  dilatation of the aortic root measuring 38 mm. 6. The inferior vena cava is normal in size with greater than 50% respiratory variability, suggesting right atrial pressure of 3 mmHg.   ASSESSMENT AND PLAN:  1. CAD s/p  STEMI with PCI/DES to LAD 01/09/20: -no recurrent chest pain - Continue aspirin and brilinta - anticipate stopping Brilinta in May - Continue carvedilol - Continue atorvastatin  2. Chronic combined CHF/ischemic cardiomyopathy: EF 40-45% with G1DD on echo 12/2019. Last Echo in September showed improvement in EF to 50-55% with inferoseptal and anteroseptal HK. - appears euvolemic - Continue carvedilol and losartan - Continue weight loss and low salt diet  3. HTN: BP well controlled today  4. HLD:  - last LDL 54 on lipitor.  - now on Crestor per primary care.   5. DM type 2: A1C 8.8%. on metformin and Jardiance. Needs to lose weight and resume metformin.  6. Ulceration of right great toe. LE arterial dopplers in October showed no significant PAD.    Disposition:   FU with Dr. [name] in 6 months,    Signed, [name] Diehl [name], MD  08/31/2020 1:26 PM

## 2020-08-31 ENCOUNTER — Other Ambulatory Visit: Payer: Self-pay

## 2020-08-31 ENCOUNTER — Ambulatory Visit: Payer: HMO | Admitting: Cardiology

## 2020-08-31 ENCOUNTER — Encounter: Payer: Self-pay | Admitting: Cardiology

## 2020-08-31 VITALS — BP 125/87 | HR 82 | Ht 73.0 in | Wt 255.0 lb

## 2020-08-31 DIAGNOSIS — E7800 Pure hypercholesterolemia, unspecified: Secondary | ICD-10-CM

## 2020-08-31 DIAGNOSIS — I5042 Chronic combined systolic (congestive) and diastolic (congestive) heart failure: Secondary | ICD-10-CM

## 2020-08-31 DIAGNOSIS — I10 Essential (primary) hypertension: Secondary | ICD-10-CM

## 2020-08-31 DIAGNOSIS — I2510 Atherosclerotic heart disease of native coronary artery without angina pectoris: Secondary | ICD-10-CM

## 2020-08-31 MED ORDER — EMPAGLIFLOZIN 10 MG PO TABS: 10.0000 mg | ORAL_TABLET | Freq: Every day | ORAL | Status: DC

## 2020-08-31 MED ORDER — ROSUVASTATIN CALCIUM 20 MG PO TABS: 20.0000 mg | ORAL_TABLET | Freq: Every day | ORAL | 3 refills | Status: DC

## 2020-08-31 NOTE — Patient Instructions (Addendum)
Get back on metformin  Continue your other therapy  You can come off Brilnita after May 23

## 2020-09-21 DIAGNOSIS — M205X1 Other deformities of toe(s) (acquired), right foot: Secondary | ICD-10-CM | POA: Diagnosis not present

## 2020-09-21 DIAGNOSIS — M19072 Primary osteoarthritis, left ankle and foot: Secondary | ICD-10-CM | POA: Diagnosis not present

## 2020-09-21 DIAGNOSIS — L97512 Non-pressure chronic ulcer of other part of right foot with fat layer exposed: Secondary | ICD-10-CM | POA: Diagnosis not present

## 2020-09-21 DIAGNOSIS — M898X9 Other specified disorders of bone, unspecified site: Secondary | ICD-10-CM | POA: Diagnosis not present

## 2020-09-21 DIAGNOSIS — M2041 Other hammer toe(s) (acquired), right foot: Secondary | ICD-10-CM | POA: Diagnosis not present

## 2020-09-21 DIAGNOSIS — E1151 Type 2 diabetes mellitus with diabetic peripheral angiopathy without gangrene: Secondary | ICD-10-CM | POA: Diagnosis not present

## 2020-10-16 DIAGNOSIS — I10 Essential (primary) hypertension: Secondary | ICD-10-CM | POA: Diagnosis not present

## 2020-10-16 DIAGNOSIS — E7800 Pure hypercholesterolemia, unspecified: Secondary | ICD-10-CM | POA: Diagnosis not present

## 2020-10-16 DIAGNOSIS — I2510 Atherosclerotic heart disease of native coronary artery without angina pectoris: Secondary | ICD-10-CM | POA: Diagnosis not present

## 2020-10-16 DIAGNOSIS — E1169 Type 2 diabetes mellitus with other specified complication: Secondary | ICD-10-CM | POA: Diagnosis not present

## 2020-10-16 DIAGNOSIS — Z0184 Encounter for antibody response examination: Secondary | ICD-10-CM | POA: Diagnosis not present

## 2020-10-19 DIAGNOSIS — E1151 Type 2 diabetes mellitus with diabetic peripheral angiopathy without gangrene: Secondary | ICD-10-CM | POA: Diagnosis not present

## 2020-10-19 DIAGNOSIS — L97512 Non-pressure chronic ulcer of other part of right foot with fat layer exposed: Secondary | ICD-10-CM | POA: Diagnosis not present

## 2021-01-10 ENCOUNTER — Telehealth: Payer: Self-pay

## 2021-01-10 DIAGNOSIS — Z789 Other specified health status: Secondary | ICD-10-CM

## 2021-01-10 NOTE — Progress Notes (Signed)
[facility])                                            [place] Team                                        Statin Quality Measure Assessment    01/10/2021  MATHIS CASHMAN 12/27/1950 [id number]  Next Appointment w/ PCP: 01/16/2021  I am a [facility] clinical pharmacist that reviews patients for statin quality initiatives.     Per review of chart and payor information, patient has a diagnosis of diabetes and cardiovascular disease.This places patient into the SUPD (Statin Use In Patients with Diabetes) and SPC (Statin Use in Patients with Cardiovascular Disease) measures for CMS.  Patient reported a prior statin intolerance with atorvastatin and was switched in December of 2021 to rosuvastatin. Per discussion with patient, he stated that  He stopped taking rosuvastatin a few weeks ago due to joint pain and was concerned it was a side effect of the statin. However, the patient persisted >1 week after stopping the medication. Therefore, the patient resumed rosuvastatin.   Additionally, the patient stated that he has been contacting the cardiology clinic to schedule an appointment but has been unable to reach anyone. I informed the patient that I will reach out to his cardiologist to have someone contact the patient for an appointment.    Please consider ONE of the following recommendations:  ? Initiate high intensity statin Atorvastatin 40mg  once daily, #90, 3 refills   Rosuvastatin 20mg  once daily, #90, 3 refills    ? Initiate moderate intensity  statin with reduced frequency if prior  statin intolerance 1x weekly, #13, 3 refills   2x weekly, #26, 3 refills   3x weekly, #39, 3 refills    ? Code for past statin intolerance  (required annually)  **Provider Requirements:** Must associate code during an office visit or telehealth encounter   Drug Induced Myopathy G72.0   Myositis, unspecified M60.9   Myopathy, unspecified G72.9    Rhabdomyolysis  M03.49   Alcoholic cirrhosis of liver without ascites Z79.15   Alcoholic cirrhosis of liver with ascites K70.31   Unspecified cirrhosis of liver K74.60   Toxic liver disease with fibrosis and cirrhosis of liver K71.7      Please let us know your decision.    Thank you!   Kristeen Miss, PharmD Clinical Pharmacist [place] Cell: [phone]

## 2021-01-12 ENCOUNTER — Telehealth: Payer: Self-pay

## 2021-01-12 NOTE — Telephone Encounter (Signed)
Called patient no answer.Left message on personal to call back to schedule follow up appointment with Dr.Jordan.

## 2021-01-16 DIAGNOSIS — I10 Essential (primary) hypertension: Secondary | ICD-10-CM | POA: Diagnosis not present

## 2021-01-16 DIAGNOSIS — L97512 Non-pressure chronic ulcer of other part of right foot with fat layer exposed: Secondary | ICD-10-CM | POA: Diagnosis not present

## 2021-01-16 DIAGNOSIS — E1169 Type 2 diabetes mellitus with other specified complication: Secondary | ICD-10-CM | POA: Diagnosis not present

## 2021-01-16 DIAGNOSIS — I2510 Atherosclerotic heart disease of native coronary artery without angina pectoris: Secondary | ICD-10-CM | POA: Diagnosis not present

## 2021-01-16 DIAGNOSIS — E7800 Pure hypercholesterolemia, unspecified: Secondary | ICD-10-CM | POA: Diagnosis not present

## 2021-01-31 ENCOUNTER — Telehealth: Payer: Self-pay | Admitting: *Deleted

## 2021-01-31 NOTE — Telephone Encounter (Signed)
   [facility] Pre-operative Risk Assessment    Patient Name: [name]  DOB: 23-Apr-1951  MRN: [id number]   HEARTCARE STAFF: - Please ensure there is not already an duplicate clearance open for this procedure. - Under Visit Info/Reason for Call, type in Other and utilize the format Clearance MM/DD/YY or Clearance TBD. Do not use dashes or single digits. - If request is for dental extraction, please clarify the # of teeth to be extracted. - If the patient is currently at the dentist's office, call Pre-Op APP to address. If the patient is not currently in the dentist office, please route to the Pre-Op pool  Request for surgical clearance:  What type of surgery is being performed? Colonoscopy   When is this surgery scheduled? TBD   What type of clearance is required (medical clearance vs. Pharmacy clearance to hold med vs. Both)? Medical  Are there any medications that need to be held prior to surgery and how long? [place] name and name of physician performing surgery? [facility], Dr Paulita Fujita   What is the office phone number? [phone]   7.   What is the office fax number?           [phone]  8.   Anesthesia type (None, local, MAC, general) ? Propofol   Barbaraann Barthel 01/31/2021, 2:36 PM  _________________________________________________________________   (provider comments below)

## 2021-01-31 NOTE — Telephone Encounter (Signed)
Dr. [name]  This patient is needing to have routine colonoscopy performed. GI asking to hold Brilinta. Looks like the patient was a STEMI 01/09/2020 with PCI/DES to the LAD. Plan per past note was to stop Brilinta last month (12/2020). Are you ok with him discontinuing Brilinta all together? Looks like they are able to do the procedure on ASA.   Pease send you recs to the pre op pool. I can call him and update if you are ok with that change. He sees [place] next in follow up    Thank you

## 2021-02-04 NOTE — Telephone Encounter (Signed)
Yes he may discontinue Brilinta at this time  Teria Khachatryan [name] MD, [facility]

## 2021-02-05 NOTE — Telephone Encounter (Addendum)
    [name] DOB:  Nov 17, 1950  MRN:  [id number]   Primary Cardiologist: [name] [name], MD  Chart reviewed as part of pre-operative protocol coverage. Patient was last seen by Dr. [name] in 08/2020 at which time he was doing well from a cardiac standpoint. Patient was contacted today for further pre-op evaluation and reported doing well since last visit. No chest pain, new or worsening shortness of breath, orthopnea/PND, or syncope. He is able to complete >4.0 METS without any problems. Given past medical history and time since last visit, based on ACC/AHA guidelines, [name] would be at acceptable risk for the planned procedure without further cardiovascular testing.   Patient is now greater than 1 year out from his MI. Therefore, he no longer needs to be on Brilinta. Called and spoke with patient he confirmed that he stopped taking Brilinta at the end of May. He should continue on Aspirin 81mg  daily.  The patient was advised that if he develops new symptoms prior to surgery to contact our office to arrange for a follow-up visit, and he verbalized understanding.  I will route this recommendation to the requesting party via Epic fax function and remove from pre-op pool.  Please call with questions.  Darreld Mclean, PA-C 02/05/2021, 11:34 AM

## 2021-02-13 ENCOUNTER — Other Ambulatory Visit: Payer: Self-pay

## 2021-02-13 ENCOUNTER — Encounter (HOSPITAL_BASED_OUTPATIENT_CLINIC_OR_DEPARTMENT_OTHER): Payer: HMO | Attending: Internal Medicine | Admitting: Internal Medicine

## 2021-02-13 DIAGNOSIS — I10 Essential (primary) hypertension: Secondary | ICD-10-CM

## 2021-02-13 DIAGNOSIS — E11621 Type 2 diabetes mellitus with foot ulcer: Secondary | ICD-10-CM | POA: Diagnosis not present

## 2021-02-13 DIAGNOSIS — Z881 Allergy status to other antibiotic agents status: Secondary | ICD-10-CM | POA: Diagnosis not present

## 2021-02-13 DIAGNOSIS — Z87891 Personal history of nicotine dependence: Secondary | ICD-10-CM | POA: Insufficient documentation

## 2021-02-13 DIAGNOSIS — Z794 Long term (current) use of insulin: Secondary | ICD-10-CM | POA: Insufficient documentation

## 2021-02-13 DIAGNOSIS — L97519 Non-pressure chronic ulcer of other part of right foot with unspecified severity: Secondary | ICD-10-CM | POA: Diagnosis not present

## 2021-02-13 NOTE — Progress Notes (Signed)
[name], [name] L. ([id number]) Visit Report for 02/13/2021 Chief Complaint Document Details Patient Name: Date of Service: [name], [name] 02/13/2021 2:45 PM Medical Record Number: [id number] Patient Account Number: [id number] Date of Birth/Sex: Treating RN: Dec 14, 1950 (70 y.o. Male) Deon Pilling Primary Care Provider: [name] Other Clinician: Referring Provider: Treating Provider/Extender: [name] in Treatment: 0 Information Obtained from: Patient Chief Complaint Right great toe wound Electronic Signature(s) Signed: 02/13/2021 4:41:02 PM By: Kalman Shan DO Entered By: Kalman Shan on 02/13/2021 15:45:45 -------------------------------------------------------------------------------- Debridement Details Patient Name: Date of Service: [name]. 02/13/2021 2:45 PM Medical Record Number: [id number] Patient Account Number: [id number] Date of Birth/Sex: Treating RN: [birth year] (70 y.o. Male) Deon Pilling Primary Care Provider: [name] Other Clinician: Referring Provider: Treating Provider/Extender: [name] in Treatment: 0 Debridement Performed for Assessment: Wound #1 Right T Great oe Performed By: Physician Kalman Shan, DO Debridement Type: Debridement Severity of Tissue Pre Debridement: Fat layer exposed Level of Consciousness (Pre-procedure): Awake and Alert Pre-procedure Verification/Time Out Yes - 15:20 Taken: Start Time: 15:21 Pain Control: Other : benzocaine 20% T Area Debrided (L x W): otal 2 (cm) x 2 (cm) = 4 (cm) Tissue and other material debrided: Viable, Non-Viable, Callus, Slough, Subcutaneous, Skin: Dermis , Skin: Epidermis, Fibrin/Exudate, Slough Level: Skin/Subcutaneous Tissue Debridement Description: Excisional Instrument: Blade, Curette, Forceps Bleeding: Minimum Hemostasis Achieved: Pressure End Time: 15:28 Procedural Pain: 0 Post Procedural Pain: 0 Response  to Treatment: Procedure was tolerated well Level of Consciousness (Post- Awake and Alert procedure): Post Debridement Measurements of Total Wound Length: (cm) 0.8 Width: (cm) 0.8 Depth: (cm) 0.5 Volume: (cm) 0.251 Character of Wound/Ulcer Post Debridement: Improved Severity of Tissue Post Debridement: Fat layer exposed Post Procedure Diagnosis Same as Pre-procedure Electronic Signature(s) Signed: 02/13/2021 4:38:16 PM By: Deon Pilling Signed: 02/13/2021 4:41:02 PM By: Kalman Shan DO Entered By: Deon Pilling on 02/13/2021 15:29:10 -------------------------------------------------------------------------------- HPI Details Patient Name: Date of Service: [name]. 02/13/2021 2:45 PM Medical Record Number: [id number] Patient Account Number: [id number] Date of Birth/Sex: Treating RN: [birth year] (70 y.o. Male) Deon Pilling Primary Care Provider: [name] Other Clinician: Referring Provider: Treating Provider/Extender: [name] in Treatment: 0 History of Present Illness HPI Description: Admission 6/28 Mr. Breton Green is a 70 year old male with a past medical history of uncontrolled insulin-dependent type 2 diabetes that presents to the wound care center for a 21-month history of nonhealing wound to the right great toe. He states he has been following with podiatry for this issue. He is currently using antibiotic ointment to the area. He is wearing crocs today. He denies Increased warmth, erythema or purulent drainage from the wound. Electronic Signature(s) Signed: 02/13/2021 4:41:02 PM By: Kalman Shan DO Entered By: Kalman Shan on 02/13/2021 16:21:02 -------------------------------------------------------------------------------- Physical Exam Details Patient Name: Date of Service: [name], [name] 02/13/2021 2:45 PM Medical Record Number: [id number] Patient Account Number: [id number] Date of Birth/Sex: Treating  RN: Dec 15, 1950 (70 y.o. Male) Deon Pilling Primary Care Provider: [name] Other Clinician: Referring Provider: Treating Provider/Extender: [name] in Treatment: 0 Constitutional respirations regular, non-labored and within target range for patient.. Cardiovascular 2+ dorsalis pedis/posterior tibialis pulses. Psychiatric pleasant and cooperative. Notes Open wound to the right great toe with significant callus and undermining. No obvious signs of infection. Electronic Signature(s) Signed: 02/13/2021 4:41:02 PM By: Kalman Shan DO Entered By: Kalman Shan on 02/13/2021 16:22:41 -------------------------------------------------------------------------------- Physician Orders Details Patient Name: Date of  Service: [name] [name], [name] 02/13/2021 2:45 PM Medical Record Number: [id number] Patient Account Number: [id number] Date of Birth/Sex: Treating RN: 1951-03-14 (70 y.o. 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 - Dr. Heber [place] Friday Return A Edema Control - Lymphedema / SCD / Other Elevate legs to the level of the heart or above for 30 minutes daily and/or when sitting, a frequency of: - 3-4 times a day throughout the day. Avoid standing for long periods of time. Off-Loading Wedge shoe to: - right foot. Patient to wear while walking and standing. May wear regular shoe while driving. Wound Treatment Wound #1 - T Great oe Wound Laterality: Right Cleanser: Soap and Water 1 x Per Day/30 Days Discharge  Instructions: May shower and wash wound with dial antibacterial soap and water prior to dressing change. Prim Dressing: KerraCel Ag Gelling Fiber Dressing, 2x2 in (silver alginate) (DME) (Generic) 1 x Per Day/30 Days ary Discharge Instructions: ***Lightly pack into wound bed.***Apply silver alginate to wound bed as instructed Secondary Dressing: Woven Gauze Sponges 2x2 in (DME) (Generic) 1 x Per Day/30 Days Discharge Instructions: Apply over primary dressing as directed. Secondary Dressing: Optifoam Non-Adhesive Dressing, 4x4 in (DME) (Generic) 1 x Per Day/30 Days Discharge Instructions: Apply over primary dressing as directed. Secured With: Child psychotherapist, Sterile 2x75 (in/in) (DME) (Generic) 1 x Per Day/30 Days Discharge Instructions: Secure with stretch gauze as directed. Secured With: 67M Medipore H Soft Cloth Surgical T 4 x 2 (in/yd) (DME) (Generic) 1 x Per Day/30 Days ape Discharge Instructions: Secure dressing with tape as directed. Radiology X-ray, toe right great toe - x-ray of right great toe related to non-healing diabetic foot ulcer looking for infection. CPT code - (ICD10 E11.621 - Type 2 diabetes mellitus with foot ulcer) Patient Medications llergies: Januvia, tetracycline A Notifications Medication Indication Start End 02/13/2021 benzocaine DOSE topical 20 % aerosol - aerosol topical only for debridement by MD. Electronic Signature(s) Signed: 02/13/2021 4:41:02 PM By: Kalman Shan DO Entered By: Kalman Shan on 02/13/2021 16:23:00 Prescription 02/13/2021 -------------------------------------------------------------------------------- [name], [name] L. Kalman Shan DO Patient Name: Provider: [birth year] [id number] Date of Birth: NPI#: Male [id number] Sex: DEA #: [phone] Phone #: License #: [place] Patient Address: [address] [place], [place] [address] [place], [place] [address] [phone] Allergies Januvia; tetracycline Provider's Orders X-ray, toe right great toe - ICD10: E11.621 - x-ray of right great toe related to non-healing diabetic foot ulcer looking for infection. CPT code Hand Signature: Date(s): Prescription 02/13/2021 [name], [name] L. Kalman Shan DO Patient Name: Provider: Jan 06, 1951 [id number] Date of Birth: NPI#: Male [id number] Sex: DEA #: [phone] Phone #: License #: [place] Patient Address: [facility] [address] [place], [place] [address] [place], [place] [address] [phone] Allergies Januvia; tetracycline Medication Medication: Route: Strength: Form: benzocaine 20 % topical aerosol topical 20 % aerosol Class: TOPICAL LOCAL ANESTHETICS Dose: Frequency / Time: Indication: aerosol topical only for debridement by MD. Number of Refills: Number of Units: 0 Generic Substitution: Start Date: End Date: One Time Use: Substitution  Permitted 02/26/6268 No Note to Pharmacy: Hand Signature: Date(s): Electronic Signature(s) Signed: 02/13/2021 4:41:02 PM By: Kalman Shan DO Entered By: Kalman Shan on 02/13/2021 16:40:31 -------------------------------------------------------------------------------- Problem List Details Patient Name: Date of Service: [name] 02/13/2021 2:45 PM Medical Record Number: [id number] Patient Account Number: [id number] Date of Birth/Sex: Treating RN: Apr 04, 1951 (69 y.o. Male) Deon Pilling Primary Care Provider: [name] Other Clinician: Referring Provider: Treating Provider/Extender: [name] in Treatment: 0 Active Problems ICD-10 Encounter Code Description Active Date MDM Diagnosis E11.621 Type 2 diabetes mellitus with foot ulcer 02/13/2021 No Yes L97.519 Non-pressure chronic ulcer of other part of right foot with unspecified severity  02/13/2021 No Yes I10 Essential (primary) hypertension 02/13/2021 No Yes Inactive Problems Resolved Problems Electronic Signature(s) Signed: 02/13/2021 4:41:02 PM By: Kalman Shan DO Previous Signature: 02/13/2021 3:16:41 PM Version By: Kalman Shan DO Entered By: Kalman Shan on 02/13/2021 15:45:07 -------------------------------------------------------------------------------- Progress Note Details Patient Name: Date of Service: [name]. 02/13/2021 2:45 PM Medical Record Number: [id number] Patient Account Number: [id number] Date of Birth/Sex: Treating RN: 1951-06-12 (69 y.o. Male) Deon Pilling Primary Care Provider: [name] Other Clinician: Referring Provider: Treating Provider/Extender: [name] in Treatment: 0 Subjective Chief Complaint Information obtained from Patient Right great toe wound History of Present Illness (HPI) Admission 6/28 Mr. Andree Golphin is a 70 year old male with a past medical history of uncontrolled insulin-dependent type 2 diabetes that presents to the wound care center for a 40-month history of nonhealing wound to the right great toe. He states he has been following with podiatry for this issue. He is currently using antibiotic ointment to the area. He is wearing crocs today. He denies Increased warmth, erythema or purulent drainage from the wound. Patient History Information obtained from Patient. Allergies Januvia (Reaction: rash), tetracycline (Reaction: rash) Family History Cancer - Mother,Maternal Grandparents,Father, Hypertension - Mother, Lung Disease - Father, No family history of Diabetes, Heart Disease, Hereditary Spherocytosis, Kidney Disease, Seizures, Stroke, Thyroid Problems, Tuberculosis. Social History Former smoker - smokeless tobacoo, Marital Status - Single, Alcohol Use - Rarely - beer, Drug Use - No History, Caffeine Use - Daily - coffee. Medical History Eyes Denies history of  Cataracts, Glaucoma, Optic Neuritis Ear/Nose/Mouth/Throat Denies history of Chronic sinus problems/congestion, Middle ear problems Hematologic/Lymphatic Denies history of Anemia, Hemophilia, Human Immunodeficiency Virus, Lymphedema, Sickle Cell Disease Respiratory Denies history of Aspiration, Asthma, Chronic Obstructive Pulmonary Disease (COPD), Pneumothorax, Sleep Apnea, Tuberculosis Cardiovascular Patient has history of Myocardial Infarction - 01/10/2020 Denies history of Arrhythmia, Congestive Heart Failure, Coronary Artery Disease, Deep Vein Thrombosis, Hypertension, Hypotension, Peripheral Arterial Disease, Peripheral Venous Disease, Phlebitis, Vasculitis Gastrointestinal Denies history of Cirrhosis , Colitis, Crohnoos, Hepatitis A, Hepatitis B, Hepatitis C Endocrine Patient has history of Type II Diabetes Denies history of Type I Diabetes Genitourinary Denies history of End Stage Renal Disease Immunological Denies history of Lupus Erythematosus, Raynaudoos, Scleroderma Integumentary (Skin) Denies history of History of Burn Musculoskeletal Patient has history of Osteoarthritis Denies history of Gout, Rheumatoid Arthritis, Osteomyelitis Neurologic Denies history of Dementia, Neuropathy, Quadriplegia, Paraplegia, Seizure Disorder Oncologic Denies history of Received Chemotherapy, Received Radiation Psychiatric Denies history of Anorexia/bulimia, Confinement Anxiety Patient is treated with Controlled Diet, Oral Agents. Blood sugar is tested. Hospitalization/Surgery History - 01/09/2020 STEMI stent placement. Medical A Surgical History Notes nd Musculoskeletal carpel tunnel 2021 right wrist Oncologic Skin [name] 10/21 Review of Systems (ROS) Constitutional Symptoms (General Health) Denies complaints or symptoms of Fatigue, Fever, Chills, Marked Weight Change. Eyes Complains  or has symptoms of Glasses / Contacts. Denies complaints or symptoms of Dry Eyes, Vision  Changes. Ear/Nose/Mouth/Throat Denies complaints or symptoms of Chronic sinus problems or rhinitis. Respiratory Denies complaints or symptoms of Chronic or frequent coughs, Shortness of Breath. Gastrointestinal Denies complaints or symptoms of Frequent diarrhea, Nausea, Vomiting. Endocrine Denies complaints or symptoms of Heat/cold intolerance. Genitourinary Denies complaints or symptoms of Frequent urination. Integumentary (Skin) Complains or has symptoms of Wounds - currently. Musculoskeletal Denies complaints or symptoms of Muscle Pain, Muscle Weakness. Neurologic Denies complaints or symptoms of Numbness/parasthesias. Psychiatric Denies complaints or symptoms of Claustrophobia, Suicidal. Objective Constitutional respirations regular, non-labored and within target range for patient.. Vitals Time Taken: 2:45 PM, Height: 73 in, Source: Stated, Weight: 250 lbs, Source: Stated, BMI: 33, Temperature: 98 F, Pulse: 76 bpm, Respiratory Rate: 20 breaths/min, Blood Pressure: 153/75 mmHg. General Notes: per patient does not check blood glucose regularly. Per patient should check daily. Cardiovascular 2+ dorsalis pedis/posterior tibialis pulses. Psychiatric pleasant and cooperative. General Notes: Open wound to the right great toe with significant callus and undermining. No obvious signs of infection. Integumentary (Hair, Skin) Wound #1 status is Open. Original cause of wound was Blister. The date acquired was: 05/23/2020. The wound is located on the Right T Great. The wound oe measures 0.4cm length x 0.5cm width x 0.6cm depth; 0.157cm^2 area and 0.094cm^3 volume. There is Fat Layer (Subcutaneous Tissue) exposed. There is no tunneling or undermining noted. There is a medium amount of serosanguineous drainage noted. The wound margin is distinct with the outline attached to the wound base. There is small (1-33%) pink, pale granulation within the wound bed. There is no necrotic tissue within  the wound bed. General Notes: callous to periwound. 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 Significant callus and nonviable tissue was present and this was debrided. No obvious signs of skin infection however I would like to obtain an x-ray due to the chronicity of the wound. If no osteomyelitis then I would like to place this patient in a total contact cast. He is agreeable to this. ABI on the right 1.2. For now he will use silver alginate every day with dressing changes to the area and offload the foot. We will give him a front offloading shoe. 45 minutes was spent in the encounter including face-to-face and review of EMR Procedures Wound #1 Pre-procedure diagnosis of Wound #1 is a Diabetic Wound/Ulcer of the Lower Extremity located on the Right T Great .Severity of Tissue Pre Debridement is: oe Fat layer exposed. There was a Excisional Skin/Subcutaneous Tissue Debridement with a total area of 4 sq cm performed by Kalman Shan, DO. With the following instrument(s): Blade, Curette, and Forceps to remove Viable and Non-Viable tissue/material. Material removed includes Callus, Subcutaneous Tissue, Slough, Skin: Dermis, Skin: Epidermis, and Fibrin/Exudate after achieving pain control using Other (benzocaine 20%). A time out was conducted at 15:20, prior to the start of the procedure. A Minimum amount of bleeding was controlled with Pressure. The procedure was tolerated well with a pain level of 0 throughout and a pain level of 0 following the procedure. Post Debridement Measurements: 0.8cm length x 0.8cm width x 0.5cm depth; 0.251cm^3 volume. Character of Wound/Ulcer Post Debridement is improved. Severity of Tissue Post Debridement is: Fat layer  exposed. Post procedure Diagnosis Wound #1:  Same as Pre-Procedure Plan Follow-up Appointments: Return Appointment in 1 week. - Dr. Heber [place] Friday Edema Control - Lymphedema / SCD / Other: Elevate legs to the level of the heart or above for 30 minutes daily and/or when sitting, a frequency of: - 3-4 times a day throughout the day. Avoid standing for long periods of time. Off-Loading: Wedge shoe to: - right foot. Patient to wear while walking and standing. May wear regular shoe while driving. Radiology ordered were: X-ray, toe right great toe - x-ray of right great toe related to non-healing diabetic foot ulcer looking for infection. CPT code The following medication(s) was prescribed: benzocaine topical 20 % aerosol aerosol topical only for debridement by MD. was prescribed at facility WOUND #1: - T Great Wound Laterality: Right oe Cleanser: Soap and Water 1 x Per Day/30 Days Discharge Instructions: May shower and wash wound with dial antibacterial soap and water prior to dressing change. Prim Dressing: KerraCel Ag Gelling Fiber Dressing, 2x2 in (silver alginate) (DME) (Generic) 1 x Per Day/30 Days ary Discharge Instructions: ***Lightly pack into wound bed.***Apply silver alginate to wound bed as instructed Secondary Dressing: Woven Gauze Sponges 2x2 in (DME) (Generic) 1 x Per Day/30 Days Discharge Instructions: Apply over primary dressing as directed. Secondary Dressing: Optifoam Non-Adhesive Dressing, 4x4 in (DME) (Generic) 1 x Per Day/30 Days Discharge Instructions: Apply over primary dressing as directed. Secured With: Child psychotherapist, Sterile 2x75 (in/in) (DME) (Generic) 1 x Per Day/30 Days Discharge Instructions: Secure with stretch gauze as directed. Secured With: 29M Medipore H Soft Cloth Surgical T 4 x 2 (in/yd) (DME) (Generic) 1 x Per Day/30 Days ape Discharge Instructions: Secure dressing with tape as directed. 1. Aggressive offloading 2. In office sharp  debridement 3. Silver alginate daily 4. X-ray of the right foot Electronic Signature(s) Signed: 02/13/2021 4:41:02 PM By: Kalman Shan DO Entered By: Kalman Shan on 02/13/2021 16:40:16 -------------------------------------------------------------------------------- HxROS Details Patient Name: Date of Service: [name]. 02/13/2021 2:45 PM Medical Record Number: [id number] Patient Account Number: [id number] Date of Birth/Sex: Treating RN: March 23, 1951 (69 y.o. Male) Deon Pilling Primary Care Provider: [name] Other Clinician: Referring Provider: Treating Provider/Extender: [name] in Treatment: 0 Information Obtained From Patient Constitutional Symptoms (General Health) Complaints and Symptoms: Negative for: Fatigue; Fever; Chills; Marked Weight Change Eyes Complaints and Symptoms: Positive for: Glasses / Contacts Negative for: Dry Eyes; Vision Changes Medical History: Negative for: Cataracts; Glaucoma; Optic Neuritis Ear/Nose/Mouth/Throat Complaints and Symptoms: Negative for: Chronic sinus problems or rhinitis Medical History: Negative for: Chronic sinus problems/congestion; Middle ear problems Respiratory Complaints and Symptoms: Negative for: Chronic or frequent coughs; Shortness of Breath Medical History: Negative for: Aspiration; Asthma; Chronic Obstructive Pulmonary Disease (COPD); Pneumothorax; Sleep Apnea; Tuberculosis Gastrointestinal Complaints and Symptoms: Negative for: Frequent diarrhea; Nausea; Vomiting Medical History: Negative for: Cirrhosis ; Colitis; Crohns; Hepatitis A; Hepatitis B; Hepatitis C Endocrine Complaints and Symptoms: Negative for: Heat/cold intolerance Medical History: Positive for: Type II Diabetes Negative for: Type I Diabetes Time with diabetes: dx 2013 Treated with: Oral agents, Diet Blood sugar tested every day: Yes Tested : daily Genitourinary Complaints and  Symptoms: Negative for: Frequent urination Medical History: Negative for: End Stage Renal Disease Integumentary (Skin) Complaints and Symptoms: Positive for: Wounds - currently Medical History: Negative for: History of Burn Musculoskeletal Complaints and Symptoms: Negative for: Muscle Pain; Muscle Weakness Medical History: Positive for: Osteoarthritis Negative for: Gout; Rheumatoid Arthritis; Osteomyelitis Past Medical History Notes: carpel tunnel 2021 right wrist Neurologic Complaints and  Symptoms: Negative for: Numbness/parasthesias Medical History: Negative for: Dementia; Neuropathy; Quadriplegia; Paraplegia; Seizure Disorder Psychiatric Complaints and Symptoms: Negative for: Claustrophobia; Suicidal Medical History: Negative for: Anorexia/bulimia; Confinement Anxiety Hematologic/Lymphatic Medical History: Negative for: Anemia; Hemophilia; Human Immunodeficiency Virus; Lymphedema; Sickle Cell Disease Cardiovascular Medical History: Positive for: Myocardial Infarction - 01/10/2020 Negative for: Arrhythmia; Congestive Heart Failure; Coronary Artery Disease; Deep Vein Thrombosis; Hypertension; Hypotension; Peripheral Arterial Disease; Peripheral Venous Disease; Phlebitis; Vasculitis Immunological Medical History: Negative for: Lupus Erythematosus; Raynauds; Scleroderma Oncologic Medical History: Negative for: Received Chemotherapy; Received Radiation Past Medical History Notes: Skin [name] 10/21 Immunizations Pneumococcal Vaccine: Received Pneumococcal Vaccination: Yes Implantable Devices No devices added Hospitalization / Surgery History Type of Hospitalization/Surgery 01/09/2020 STEMI stent placement Family and Social History Cancer: Yes - Mother,Maternal Grandparents,Father; Diabetes: No; Heart Disease: No; Hereditary Spherocytosis: No; Hypertension: Yes - Mother; Kidney Disease: No; Lung Disease: Yes - Father; Seizures: No; Stroke: No; Thyroid Problems: No;  Tuberculosis: No; Former smoker - smokeless tobacoo; Marital Status - Single; Alcohol Use: Rarely - beer; Drug Use: No History; Caffeine Use: Daily - coffee; Financial Concerns: No; Food, Clothing or Shelter Needs: No; Support System Lacking: No; Transportation Concerns: No Electronic Signature(s) Signed: 02/13/2021 3:16:41 PM By: Kalman Shan DO Signed: 02/13/2021 4:38:16 PM By: Deon Pilling Entered By: Deon Pilling on 02/13/2021 14:58:34 -------------------------------------------------------------------------------- SuperBill Details Patient Name: Date of Service: [name] 02/13/2021 Medical Record Number: [id number] Patient Account Number: [id number] Date of Birth/Sex: Treating RN: 1951-04-28 (69 y.o. 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 I10 Essential (primary) hypertension Facility Procedures CPT4 Code: [id number] Description: [place] VISIT-LEV 3 EST PT Modifier: Quantity: 1 CPT4 Code: [id number] Description: [place] - DEB SUBQ TISSUE 20 SQ CM/< ICD-10 Diagnosis Description L97.519 Non-pressure chronic ulcer of other part of right foot with unspecified severit Modifier: y Quantity: 1 Physician Procedures : CPT4 Code Description Modifier [id number] - WC PHYS LEVEL 4 - NEW PT ICD-10 Diagnosis Description E11.621 Type 2 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

## 2021-02-13 NOTE — Progress Notes (Signed)
Green, [name] L. ([id number]) Visit Report for 02/13/2021 Abuse/Suicide Risk Screen Details Patient Name: Date of Service: [name], Green 02/13/2021 2:45 PM Medical Record Number: [id number] Patient Account Number: [id number] Date of Birth/Sex: Treating RN: Jan 30, 1951 (69 y.o. Male) Deon Pilling Primary Care Riyad Keena: [name] Other Clinician: Referring Abdurahman Rugg: Treating Yarely Bebee/Extender: [name] in Treatment: 0 Abuse/Suicide Risk Screen Items Answer ABUSE RISK SCREEN: Has anyone close to you tried to hurt or harm you recentlyo No Do you feel uncomfortable with anyone in your familyo No Has anyone forced you do things that you didnt want to doo No Electronic Signature(s) Signed: 02/13/2021 4:38:16 PM By: Deon Pilling Entered By: Deon Pilling on 02/13/2021 14:47:26 -------------------------------------------------------------------------------- Activities of Daily Living Details Patient Name: Date of Service: [name], Green 02/13/2021 2:45 PM Medical Record Number: [id number] Patient Account Number: [id number] Date of Birth/Sex: Treating RN: 06-27-1951 (69 y.o. Male) Deon Pilling Primary Care Santos Hardwick: [name] Other Clinician: Referring Scheryl Sanborn: Treating Lucy Boardman/Extender: [name] in Treatment: 0 Activities of Daily Living Items Answer Activities of Daily Living (Please select one for each item) Drive Automobile Completely Able T Medications ake Completely Able Use T elephone Completely Able Care for Appearance Completely Able Use T oilet Completely Able Bath / Shower Completely Able Dress Self Completely Able Feed Self Completely Able Walk Completely Able Get In / Out Bed Completely Able Housework Completely Able Prepare Meals Completely [place] for Self Completely Able Electronic Signature(s) Signed: 02/13/2021 4:38:16 PM By: Deon Pilling Entered By:  Deon Pilling on 02/13/2021 14:47:45 -------------------------------------------------------------------------------- Education Screening Details Patient Name: Date of Service: [name]. 02/13/2021 2:45 PM Medical Record Number: [id number] Patient Account Number: [id number] Date of Birth/Sex: Treating RN: 06-05-1951 (69 y.o. Male) Deon Pilling Primary Care Kenderick Kobler: [name] Other Clinician: Referring Domnick Chervenak: Treating Taresa [place]/Extender: [name] in Treatment: 0 Primary Learner Assessed: Patient Learning Preferences/Education Level/Primary Language Learning Preference: Explanation, Demonstration, Printed Material Highest Education Level: High School Preferred Language: English Cognitive Barrier Language Barrier: No Translator Needed: No Memory Deficit: No Emotional Barrier: No Cultural/Religious Beliefs Affecting Medical Care: No Physical Barrier Impaired Vision: Yes Glasses Impaired Hearing: No Decreased Hand dexterity: No Knowledge/Comprehension Knowledge Level: High Comprehension Level: High Ability to understand written instructions: High Ability to understand verbal instructions: High Motivation Anxiety Level: Calm Cooperation: Cooperative Education Importance: Acknowledges Need Interest in Health Problems: Asks Questions Perception: Coherent Willingness to Engage in Self-Management High Activities: Readiness to Engage in Self-Management High Activities: Electronic Signature(s) Signed: 02/13/2021 4:38:16 PM By: Deon Pilling Entered By: Deon Pilling on 02/13/2021 14:48:20 -------------------------------------------------------------------------------- Fall Risk Assessment Details Patient Name: Date of Service: [name]. 02/13/2021 2:45 PM Medical Record Number: [id number] Patient Account Number: [id number] Date of Birth/Sex: Treating RN: Nov 17, 1950 (69 y.o. Male) Deon Pilling Primary Care Blayklee Mable:  [name] Other Clinician: Referring Joenathan Sakuma: Treating Mylinh Cragg/Extender: [name] in Treatment: 0 Fall Risk Assessment Items Have you had 2 or more falls in the last 12 monthso 0 No Have you had any fall that resulted in injury in the last 12 monthso 0 No FALLS RISK SCREEN History of falling - immediate or within 3 months 25 Yes Secondary diagnosis (Do you have 2 or more medical diagnoseso) 0 No Ambulatory aid None/bed rest/wheelchair/nurse 0 Yes Crutches/cane/walker 0 No Furniture 0 No Intravenous therapy Access/Saline/Heparin Lock 0 No Gait/Transferring Normal/ bed rest/ wheelchair 0 Yes Weak (short steps with or  without shuffle, stooped but able to lift head while walking, may seek 0 No support from furniture) Impaired (short steps with shuffle, may have difficulty arising from chair, head down, impaired 0 No balance) Mental Status Oriented to own ability 0 Yes Electronic Signature(s) Signed: 02/13/2021 4:38:16 PM By: Deon Pilling Entered By: Deon Pilling on 02/13/2021 14:48:58 -------------------------------------------------------------------------------- Foot Assessment Details Patient Name: Date of Service: [name]. 02/13/2021 2:45 PM Medical Record Number: [id number] Patient Account Number: [id number] Date of Birth/Sex: Treating RN: [birth year] (70 y.o. Male) Deon Pilling Primary Care Mayetta Castleman: [name] Other Clinician: Referring Trayon Krantz: Treating Kaitlyn Franko/Extender: [name] in Treatment: 0 Foot Assessment Items Site Locations + = Sensation present, - = Sensation absent, C = Callus, U = Ulcer R = Redness, W = Warmth, M = Maceration, PU = Pre-ulcerative lesion F = Fissure, S = Swelling, D = Dryness Assessment Right: Left: Other Deformity: No No Prior Foot Ulcer: Yes No Prior Amputation: No No Charcot Joint: No No Ambulatory Status: Ambulatory Without Help Gait:  Steady Electronic Signature(s) Signed: 02/13/2021 4:38:16 PM By: Deon Pilling Entered By: Deon Pilling on 02/13/2021 15:13:28 -------------------------------------------------------------------------------- Nutrition Risk Screening Details Patient Name: Date of Service: [name], Green 02/13/2021 2:45 PM Medical Record Number: [id number] Patient Account Number: [id number] Date of Birth/Sex: Treating RN: 03/30/1951 (69 y.o. Male) Deon Pilling Primary Care Shaquile Lutze: [name] Other Clinician: Referring Beau Ramsburg: Treating Jonella Redditt/Extender: [name] in Treatment: 0 Height (in): 73 Weight (lbs): 250 Body Mass Index (BMI): 33 Nutrition Risk Screening Items Score Screening NUTRITION RISK SCREEN: I have an illness or condition that made me change the kind and/or amount of food I eat 2 Yes I eat fewer than two meals per day 0 No I eat few fruits and vegetables, or milk products 0 No I have three or more drinks of beer, liquor or wine almost every day 0 No I have tooth or mouth problems that make it hard for me to eat 0 No I don't always have enough money to buy the food I need 0 No I eat alone most of the time 0 No I take three or more different prescribed or over-the-counter drugs a day 1 Yes Without wanting to, I have lost or gained 10 pounds in the last six months 0 No I am not always physically able to shop, cook and/or feed myself 0 No Nutrition Protocols Good Risk Protocol Provide education on elevated blood Moderate Risk Protocol 0 sugars and impact on wound healing, as applicable High Risk Proctocol Risk Level: Moderate Risk Score: 3 Electronic Signature(s) Signed: 02/13/2021 4:38:16 PM By: Deon Pilling Entered By: Deon Pilling on 02/13/2021 14:50:14

## 2021-02-13 NOTE — Progress Notes (Addendum)
Green, [name] L. ([id number]) Visit Report for 02/13/2021 Allergy List Details Patient Name: Date of Service: [name], [name] 02/13/2021 2:45 PM Medical Record Number: [id number] Patient Account Number: [id number] Date of Birth/Sex: Treating RN: 07-25-1951 (69 y.o. Male) [name] Primary Care [name]: [name] Other Clinician: Referring [name]: Treating [name]/Extender: [name] in Treatment: 0 Allergies Active Allergies Januvia Reaction: rash tetracycline Reaction: rash Allergy Notes Electronic Signature(s) Signed: 02/13/2021 4:38:16 PM By: [name] Entered By: [name] on 02/13/2021 14:44:20 -------------------------------------------------------------------------------- Arrival Information Details Patient Name: Date of Service: [name]. 02/13/2021 2:45 PM Medical Record Number: [id number] Patient Account Number: [id number] Date of Birth/Sex: Treating RN: Dec 13, 1950 (69 y.o. Male) [name] Primary Care [name]: [name] Other Clinician: Referring [name]: Treating [name]/Extender: [name] in Treatment: 0 Visit Information Patient Arrived: Ambulatory Arrival Time: 14:40 Accompanied By: self Transfer Assistance: None Patient Identification Verified: Yes Secondary Verification Process Completed: Yes Patient Requires Transmission-Based Precautions: No Patient Has Alerts: No Electronic Signature(s) Signed: 02/13/2021 4:38:16 PM By: [name] Entered By: [name] on 02/13/2021 14:44:23 -------------------------------------------------------------------------------- Clinic Level of Care Assessment Details Patient Name: Date of Service: [name], [name] 02/13/2021 2:45 PM Medical Record Number: [id number] Patient Account Number: [id number] Date of Birth/Sex: Treating RN: [birth year] (69 y.o. Male) [name] Primary Care [name]: [name] Other  Clinician: Referring [name]: Treating [name]/Extender: [name] in Treatment: 0 Clinic Level of Care Assessment Items TOOL 1 Quantity Score X- 1 0 Use when EandM and Procedure is performed on INITIAL visit ASSESSMENTS - Nursing Assessment / Reassessment X- 1 20 General Physical Exam (combine w/ comprehensive assessment (listed just below) when performed on new pt. evals) X- 1 25 Comprehensive Assessment (HX, ROS, Risk Assessments, Wounds Hx, etc.) ASSESSMENTS - Wound and Skin Assessment / Reassessment X- 1 10 Dermatologic / Skin Assessment (not related to wound area) ASSESSMENTS - Ostomy and/or Continence Assessment and Care []  - 0 Incontinence Assessment and Management []  - 0 Ostomy Care Assessment and Management (repouching, etc.) PROCESS - Coordination of Care X - Simple Patient / Family Education for ongoing care 1 15 []  - 0 Complex (extensive) Patient / Family Education for ongoing care X- 1 10 Staff obtains Programmer, systems, Records, T Results / Process Orders est []  - 0 Staff telephones HHA, Nursing Homes / Clarify orders / etc []  - 0 Routine Transfer to another Facility (non-emergent condition) []  - 0 Routine Hospital Admission (non-emergent condition) X- 1 15 New Admissions / Biomedical engineer / Ordering NPWT Apligraf, etc. , []  - 0 Emergency Hospital Admission (emergent condition) PROCESS - Special Needs []  - 0 Pediatric / Minor Patient Management []  - 0 Isolation Patient Management []  - 0 Hearing / Language / Visual special needs []  - 0 Assessment of Community assistance (transportation, D/C planning, etc.) []  - 0 Additional assistance / Altered mentation []  - 0 Support Surface(s) Assessment (bed, cushion, seat, etc.) INTERVENTIONS - Miscellaneous []  - 0 External ear exam []  - 0 Patient Transfer (multiple staff / Civil Service fast streamer / Similar devices) []  - 0 Simple Staple / Suture removal (25 or less) []  - 0 Complex Staple  / Suture removal (26 or more) []  - 0 Hypo/Hyperglycemic Management (do not check if billed separately) X- 1 15 Ankle / Brachial Index (ABI) - do not check if billed separately Has the patient been seen at the hospital within the last three years: Yes Total Score: 110 Level Of Care: New/Established -  Level 3 Electronic Signature(s) Signed: 02/13/2021 4:38:16 PM By: [name] Signed: 02/13/2021 4:38:16 PM By: [name] Entered By: [name] on 02/13/2021 15:48:05 -------------------------------------------------------------------------------- Encounter Discharge Information Details Patient Name: Date of Service: [name] [name] Parsons. 02/13/2021 2:45 PM Medical Record Number: [id number] Patient Account Number: [id number] Date of Birth/Sex: Treating RN: 30-Oct-1950 (69 y.o. Male) Levan Hurst Primary Care Lakeena Downie: [name] Other Clinician: Referring Aminat Shelburne: Treating Earon Rivest/Extender: [name] in Treatment: 0 Encounter Discharge Information Items Post Procedure Vitals Discharge Condition: Stable Temperature (F): 98 Ambulatory Status: Ambulatory Pulse (bpm): 76 Discharge Destination: Home Respiratory Rate (breaths/min): 20 Transportation: Private Auto Blood Pressure (mmHg): 153/75 Accompanied By: alone Schedule Follow-up Appointment: Yes Clinical Summary of Care: Patient Declined Electronic Signature(s) Signed: 02/13/2021 5:39:04 PM By: Levan Hurst RN, BSN Entered By: Levan Hurst on 02/13/2021 17:38:18 -------------------------------------------------------------------------------- Lower Extremity Assessment Details Patient Name: Date of Service: [name] [name] Parsons. 02/13/2021 2:45 PM Medical Record Number: [id number] Patient Account Number: [id number] Date of Birth/Sex: Treating RN: [birth year] (69 y.o. Male) [name] Primary Care Kirat Mezquita: [name] Other Clinician: Referring Bereket Gernert: Treating Lynley Killilea/Extender:  [name] in Treatment: 0 Edema Assessment Assessed: Shirlyn Goltz: No] [Right: Yes] Edema: [Left: Ye] [Right: s] Calf Left: Right: Point of Measurement: 32 cm From Medial Instep 41 cm Ankle Left: Right: Point of Measurement: 11 cm From Medial Instep 24.5 cm Knee To Floor Left: Right: From Medial Instep 47 cm Vascular Assessment Pulses: Dorsalis Pedis Palpable: [Right:Yes] Doppler Audible: [Right:Yes] Posterior Tibial Doppler Audible: [Right:Yes] Blood Pressure: Brachial: [Right:141] Ankle: [Left:Dorsalis Pedis: 165 1.17] [Right:Dorsalis Pedis: 172 1.22] Electronic Signature(s) Signed: 02/13/2021 4:38:16 PM By: [name] Entered By: [name] on 02/13/2021 15:08:08 -------------------------------------------------------------------------------- Multi Wound Chart Details Patient Name: Date of Service: [name]. 02/13/2021 2:45 PM Medical Record Number: [id number] Patient Account Number: [id number] Date of Birth/Sex: Treating RN: [birth year] (69 y.o. Male) [name] Primary Care Emelda Kohlbeck: [name] Other Clinician: Referring Hektor Huston: Treating Hebert Dooling/Extender: [name] in Treatment: 0 Vital Signs Height(in): 73 Pulse(bpm): 76 Weight(lbs): 250 Blood Pressure(mmHg): 153/75 Body Mass Index(BMI): 33 Temperature(F): 98 Respiratory Rate(breaths/min): 20 Photos: [1:No Photos Right T Great oe] [Green/A:Green/A Green/A] Wound Location: [1:Blister] [Green/A:Green/A] Wounding Event: [1:Diabetic Wound/Ulcer of the Lower] [Green/A:Green/A] Primary Etiology: [1:Extremity Myocardial Infarction, Type II] [Green/A:Green/A] Comorbid History: [1:Diabetes, Osteoarthritis 05/23/2020] [Green/A:Green/A] Date Acquired: [1:0] [Green/A:Green/A] Weeks of Treatment: [1:Open] [Green/A:Green/A] Wound Status: [1:0.4x0.5x0.6] [Green/A:Green/A] Measurements L x W x D (cm) [1:0.157] [Green/A:Green/A] A (cm) : rea [1:0.094] [Green/A:Green/A] Volume (cm) : [1:Grade 2] [Green/A:Green/A] Classification:  [1:Medium] [Green/A:Green/A] Exudate A mount: [1:Serosanguineous] [Green/A:Green/A] Exudate Type: [1:red, brown] [Green/A:Green/A] Exudate Color: [1:Distinct, outline attached] [Green/A:Green/A] Wound Margin: [1:Small (1-33%)] [Green/A:Green/A] Granulation A mount: [1:Pink, Pale] [Green/A:Green/A] Granulation Quality: [1:None Present (0%)] [Green/A:Green/A] Necrotic A mount: [1:Fat Layer (Subcutaneous Tissue): Yes Green/A] Exposed Structures: [1:Fascia: No Tendon: No Muscle: No Joint: No Bone: No None] [Green/A:Green/A] Epithelialization: [1:Debridement - Excisional] [Green/A:Green/A] Debridement: Pre-procedure Verification/Time Out 15:20 [Green/A:Green/A] Taken: [1:Other] [Green/A:Green/A] Pain Control: [1:Callus, Subcutaneous, Slough] [Green/A:Green/A] Tissue Debrided: [1:Skin/Subcutaneous Tissue] [Green/A:Green/A] Level: [1:4] [Green/A:Green/A] Debridement A (sq cm): [1:rea Blade, Curette, Forceps] [Green/A:Green/A] Instrument: [1:Minimum] [Green/A:Green/A] Bleeding: [1:Pressure] [Green/A:Green/A] Hemostasis A chieved: [1:0] [Green/A:Green/A] Procedural Pain: [1:0] [Green/A:Green/A] Post Procedural Pain: [1:Procedure was tolerated well] [Green/A:Green/A] Debridement Treatment Response: [1:0.8x0.8x0.5] [Green/A:Green/A] Post Debridement Measurements L x W x D (cm) [1:0.251] [Green/A:Green/A] Post Debridement Volume: (cm) [1:callous to periwound.] [Green/A:Green/A] Assessment Notes: [1:Debridement] [Green/A:Green/A] Treatment Notes Electronic Signature(s) Signed: 02/13/2021 4:38:16 PM By: [name] Signed: 02/13/2021  4:41:02 PM By: Kalman Shan DO Entered By: Kalman Shan on 02/13/2021 15:45:15 -------------------------------------------------------------------------------- Multi-Disciplinary Care Plan Details Patient Name: Date of Service: [name], [name] 02/13/2021 2:45 PM Medical Record Number: [id number] Patient Account Number: [id number] Date of Birth/Sex: Treating RN: [birth year] (69 y.o. Male) [name] Primary Care Donevin Sainsbury: [name] Other Clinician: Referring Hillary Schwegler: Treating Amare Kontos/Extender: [name] in Treatment: 0 Active Inactive Abuse / Safety / Falls / Self Care Management Nursing Diagnoses: Impaired physical mobility Potential for falls Goals: Patient will remain injury free related to falls Date Initiated: 02/13/2021 Target Resolution Date: 03/15/2021 Goal Status: Active Interventions: Assess fall risk on admission and as needed Assess: immobility, friction, shearing, incontinence upon admission and as needed Provide education on fall prevention Notes: Nutrition Nursing Diagnoses: Potential for alteratiion in Nutrition/Potential for imbalanced nutrition Goals: Patient/caregiver agrees to and verbalizes understanding of need to obtain nutritional consultation Date Initiated: 02/13/2021 Target Resolution Date: 02/23/2021 Goal Status: Active Interventions: Provide education on elevated blood sugars and impact on wound healing Provide education on nutrition Treatment Activities: Obtain HgA1c : 02/13/2021 Patient referred to Primary Care Physician for further nutritional evaluation : 02/13/2021 Notes: Orientation to the Wound Care Program Nursing Diagnoses: Knowledge deficit related to the wound healing center program Goals: Patient/caregiver will verbalize understanding of the [facility] Date Initiated: 02/13/2021 Target Resolution Date: 02/23/2021 Goal Status: Active Interventions: Provide education on orientation to the wound center Notes: Pain, Acute or Chronic Nursing Diagnoses: Pain, acute or chronic: actual or potential Potential alteration in comfort, pain Goals: Patient will verbalize adequate pain control and receive pain control interventions during procedures as needed Date Initiated: 02/13/2021 Target Resolution Date: 02/22/2021 Goal Status: Active Interventions: Encourage patient to take pain medications as prescribed Provide education on pain management Reposition patient for comfort Treatment Activities: Administer pain  control measures as ordered : 02/13/2021 Notes: Wound/Skin Impairment Nursing Diagnoses: Knowledge deficit related to ulceration/compromised skin integrity Goals: Patient/caregiver will verbalize understanding of skin care regimen Date Initiated: 02/13/2021 Target Resolution Date: 02/23/2021 Goal Status: Active Interventions: Assess patient/caregiver ability to perform ulcer/skin care regimen upon admission and as needed Assess ulceration(s) every visit Provide education on ulcer and skin care Treatment Activities: Skin care regimen initiated : 02/13/2021 Topical wound management initiated : 02/13/2021 Notes: Electronic Signature(s) Signed: 02/13/2021 4:38:16 PM By: [name] Entered By: [name] on 02/13/2021 15:24:52 -------------------------------------------------------------------------------- Pain Assessment Details Patient Name: Date of Service: [name], [name] 02/13/2021 2:45 PM Medical Record Number: [id number] Patient Account Number: [id number] Date of Birth/Sex: Treating RN: Nov 08, 1950 (69 y.o. Male) [name] Primary Care Jaquae Rieves: [name] Other Clinician: Referring Adrien Shankar: Treating Kristain Hu/Extender: [name] in Treatment: 0 Active Problems Location of Pain Severity and Description of Pain Patient Has Paino No Patient Has Paino No Site Locations Rate the pain. Current Pain Level: 0 Pain Management and Medication Current Pain Management: Medication: No Cold Application: No Rest: No Massage: No Activity: No T.E.Green.S.: No Heat Application: No Leg drop or elevation: No Is the Current Pain Management Adequate: Adequate How does your wound impact your activities of daily livingo Sleep: No Bathing: No Appetite: No Relationship With Others: No Bladder Continence: No Emotions: No Bowel Continence: No Work: No Toileting: No Drive: No Dressing: No Hobbies: No Electronic Signature(s) Signed: 02/13/2021  4:38:16 PM By: [name] Entered By: [name] on 02/13/2021 15:05:51 -------------------------------------------------------------------------------- Patient/Caregiver Education Details Patient Name: Date of Service: [name] [name], [name] 6/28/2022andnbsp2:45 PM Medical Record  Number: [id number] Patient Account Number: [id number] Date of Birth/Gender: Treating RN: [birth year] (71 y.o. Male) [name] Primary Care Physician: [name] Other Clinician: Referring Physician: Treating Physician/Extender: [name] in Treatment: 0 Education Assessment Education Provided To: Patient Education Topics Provided Elevated Blood Sugar/ Impact on Healing: Handouts: Elevated Blood Sugars: How Do They Affect Wound Healing Methods: Explain/Verbal, Printed Responses: Reinforcements needed [place]: o Handouts: Welcome T [facility] [place] o Methods: Explain/Verbal, Printed Responses: Reinforcements needed Wound/Skin Impairment: Handouts: Caring for Your Ulcer, Skin Care Do's and Dont's Methods: Explain/Verbal, Printed Responses: Reinforcements needed Electronic Signature(s) Signed: 02/13/2021 4:38:16 PM By: [name] Entered By: [name] on 02/13/2021 15:27:04 -------------------------------------------------------------------------------- Wound Assessment Details Patient Name: Date of Service: [name] 02/13/2021 2:45 PM Medical Record Number: [id number] Patient Account Number: [id number] Date of Birth/Sex: Treating RN: 02-Jun-1951 (70 y.o. Male) [name] Primary Care Auguste Tebbetts: [name] Other Clinician: Referring Brenton Joines: Treating Josiyah Tozzi/Extender: [name] in Treatment: 0 Wound Status Wound Number: 1 Primary Etiology: Diabetic Wound/Ulcer of the Lower Extremity Wound Location: Right T Great oe Wound Status: Open Wounding Event: Blister Comorbid History:  Myocardial Infarction, Type II Diabetes, Osteoarthritis Date Acquired: 05/23/2020 Weeks Of Treatment: 0 Clustered Wound: No Photos Wound Measurements Length: (cm) 0.4 Width: (cm) 0.5 Depth: (cm) 0.6 Area: (cm) 0.157 Volume: (cm) 0.094 % Reduction in Area: 0% % Reduction in Volume: 0% Epithelialization: None Tunneling: No Undermining: No Wound Description Classification: Grade 2 Wound Margin: Distinct, outline attached Exudate Amount: Medium Exudate Type: Serosanguineous Exudate Color: red, brown Foul Odor After Cleansing: No Slough/Fibrino No Wound Bed Granulation Amount: Small (1-33%) Exposed Structure Granulation Quality: Pink, Pale Fascia Exposed: No Necrotic Amount: None Present (0%) Fat Layer (Subcutaneous Tissue) Exposed: Yes Tendon Exposed: No Muscle Exposed: No Joint Exposed: No Bone Exposed: No Assessment Notes callous to periwound. Treatment Notes Wound #1 (Toe Great) Wound Laterality: Right Cleanser Soap and Water Discharge Instruction: May shower and wash wound with dial antibacterial soap and water prior to dressing change. Peri-Wound Care Topical Primary Dressing KerraCel Ag Gelling Fiber Dressing, 2x2 in (silver alginate) Discharge Instruction: ***Lightly pack into wound bed.***Apply silver alginate to wound bed as instructed Secondary Dressing Woven Gauze Sponges 2x2 in Discharge Instruction: Apply over primary dressing as directed. Optifoam Non-Adhesive Dressing, 4x4 in Discharge Instruction: Apply over primary dressing as directed. Secured With Conforming Stretch Gauze Bandage, Sterile 2x75 (in/in) Discharge Instruction: Secure with stretch gauze as directed. 97M Medipore H Soft Cloth Surgical T 4 x 2 (in/yd) ape Discharge Instruction: Secure dressing with tape as directed. Compression Wrap Compression Stockings Add-Ons Electronic Signature(s) Signed: 02/13/2021 4:38:16 PM By: [name] Signed: 02/13/2021 4:39:21 PM By: Sandre Kitty Entered By: Sandre Kitty on 02/13/2021 16:29:04 -------------------------------------------------------------------------------- Vitals Details Patient Name: Date of Service: [name] [name] Parsons. 02/13/2021 2:45 PM Medical Record Number: [id number] Patient Account Number: [id number] Date of Birth/Sex: Treating RN: 24-Jun-1951 (70 y.o. Male) [name], [place] Primary Care Taleeyah Bora: [name] Other Clinician: Referring Tieasha Larsen: Treating Kartik Fernando/Extender: [name] in Treatment: 0 Vital Signs Time Taken: 14:45 Temperature (F): 98 Height (in): 73 Pulse (bpm): 76 Source: Stated Respiratory Rate (breaths/min): 20 Weight (lbs): 250 Blood Pressure (mmHg): 153/75 Source: Stated Reference Range: 80 - 120 mg / dl Body Mass Index (BMI): 33 Notes per patient does not check blood glucose regularly. Per patient should check daily. Electronic Signature(s) Signed: 02/13/2021 4:38:16 PM By: [name] Entered By: [name]  on 02/13/2021 14:47:10

## 2021-02-23 ENCOUNTER — Ambulatory Visit
Admission: RE | Admit: 2021-02-23 | Discharge: 2021-02-23 | Disposition: A | Discharge status: Home / Self Care | Payer: HMO | Source: Ambulatory Visit | Attending: Internal Medicine | Admitting: Internal Medicine

## 2021-02-23 ENCOUNTER — Other Ambulatory Visit: Payer: Self-pay | Admitting: Internal Medicine

## 2021-02-23 ENCOUNTER — Other Ambulatory Visit: Payer: Self-pay

## 2021-02-23 ENCOUNTER — Encounter (HOSPITAL_BASED_OUTPATIENT_CLINIC_OR_DEPARTMENT_OTHER): Payer: HMO | Attending: Internal Medicine | Admitting: Internal Medicine

## 2021-02-23 DIAGNOSIS — Z87891 Personal history of nicotine dependence: Secondary | ICD-10-CM | POA: Diagnosis not present

## 2021-02-23 DIAGNOSIS — I10 Essential (primary) hypertension: Secondary | ICD-10-CM

## 2021-02-23 DIAGNOSIS — L97819 Non-pressure chronic ulcer of other part of right lower leg with unspecified severity: Secondary | ICD-10-CM | POA: Diagnosis not present

## 2021-02-23 DIAGNOSIS — L97519 Non-pressure chronic ulcer of other part of right foot with unspecified severity: Secondary | ICD-10-CM | POA: Diagnosis not present

## 2021-02-23 DIAGNOSIS — E11621 Type 2 diabetes mellitus with foot ulcer: Secondary | ICD-10-CM | POA: Diagnosis not present

## 2021-02-23 DIAGNOSIS — E08621 Diabetes mellitus due to underlying condition with foot ulcer: Secondary | ICD-10-CM

## 2021-02-23 DIAGNOSIS — Z794 Long term (current) use of insulin: Secondary | ICD-10-CM | POA: Insufficient documentation

## 2021-02-27 NOTE — Progress Notes (Signed)
Ingber, Ruel L. ([id number]) Visit Report for 02/23/2021 Chief Complaint Document Details Patient Name: Date of Service: SAMAD, THON 02/23/2021 1:15 PM Medical Record Number: [id number] Patient Account Number: [id number] Date of Birth/Sex: Treating RN: Sep 04, 1950 (70 y.o. [name] Primary Care Provider: [name] Other Clinician: Referring Provider: Treating Provider/Extender: [name] in Treatment: 1 Information Obtained from: Patient Chief Complaint Right great toe wound Electronic Signature(s) Signed: 02/23/2021 2:18:53 PM By: Kalman Shan DO Entered By: Kalman Shan on 02/23/2021 14:14:03 -------------------------------------------------------------------------------- Debridement Details Patient Name: Date of Service: [name] 02/23/2021 1:15 PM Medical Record Number: [id number] Patient Account Number: [id number] Date of Birth/Sex: Treating RN: [birth year] (70 y.o. [name] Primary Care Provider: [name] Other Clinician: Referring Provider: Treating Provider/Extender: [name] in Treatment: 1 Debridement Performed for Assessment: Wound #1 Right T Great oe Performed By: Physician Kalman Shan, DO Debridement Type: Debridement Severity of Tissue Pre Debridement: Fat layer exposed Level of Consciousness (Pre-procedure): Awake and Alert Pre-procedure Verification/Time Out Yes - 13:57 Taken: Start Time: 13:57 T Area Debrided (L x W): otal 0.4 (cm) x 0.5 (cm) = 0.2 (cm) Tissue and other material debrided: Viable, Non-Viable, Callus, Subcutaneous Level: Skin/Subcutaneous Tissue Debridement Description: Excisional Instrument: Curette Bleeding: Minimum Hemostasis Achieved: Pressure End Time: 13:58 Procedural Pain: 0 Post Procedural Pain: 0 Response to Treatment: Procedure was tolerated well Level of Consciousness (Post- Awake and Alert procedure): Post Debridement  Measurements of Total Wound Length: (cm) 0.4 Width: (cm) 0.5 Depth: (cm) 0.3 Volume: (cm) 0.047 Character of Wound/Ulcer Post Debridement: Improved Severity of Tissue Post Debridement: Fat layer exposed Post Procedure Diagnosis Same as Pre-procedure Electronic Signature(s) Signed: 02/23/2021 2:18:53 PM By: Kalman Shan DO Signed: 02/27/2021 5:43:47 PM By: Levan Hurst RN, BSN Entered By: Levan Hurst on 02/23/2021 14:01:00 -------------------------------------------------------------------------------- HPI Details Patient Name: Date of Service: [name] 02/23/2021 1:15 PM Medical Record Number: [id number] Patient Account Number: [id number] Date of Birth/Sex: Treating RN: [birth year] (70 y.o. [name] Primary Care Provider: [name] Other Clinician: Referring Provider: Treating Provider/Extender: [name] in Treatment: 1 History of Present Illness HPI Description: Admission 6/28 Mr. Blue Winther is a 70 year old male with a past medical history of uncontrolled insulin-dependent type 2 diabetes that presents to the wound care center for a 15-month history of nonhealing wound to the right great toe. He states he has been following with podiatry for this issue. He is currently using antibiotic ointment to the area. He is wearing crocs today. He denies Increased warmth, erythema or purulent drainage from the wound. 7/8; patient presents for 1 week follow-up. He had his x-ray of his right foot done today. He has an offloading shoe we gave him at last clinic visit that has cracked. Overall he is doing well. he has no issues with dressing changes. He denies signs of infection. Electronic Signature(s) Signed: 02/23/2021 2:18:53 PM By: Kalman Shan DO Entered By: Kalman Shan on 02/23/2021 14:14:40 -------------------------------------------------------------------------------- Physical Exam Details Patient Name: Date of  Service: CA KEYSHUN, ELPERS 02/23/2021 1:15 PM Medical Record Number: [id number] Patient Account Number: [id number] Date of Birth/Sex: Treating RN: [birth year] (70 y.o. [name] Primary Care Provider: [name] Other Clinician: Referring Provider: Treating Provider/Extender: [name] in Treatment: 1 Constitutional respirations regular, non-labored and within target range for patient.. Cardiovascular 2+ dorsalis pedis/posterior tibialis pulses. Psychiatric pleasant and cooperative. Notes Open wound to the right great toe  with significant callus. Granulation tissue present with some nonviable tissue. No obvious signs of infection. Electronic Signature(s) Signed: 02/23/2021 2:18:53 PM By: Kalman Shan DO Entered By: Kalman Shan on 02/23/2021 14:15:24 -------------------------------------------------------------------------------- Physician Orders Details Patient Name: Date of Service: [name] 02/23/2021 1:15 PM Medical Record Number: [id number] Patient Account Number: [id number] Date of Birth/Sex: Treating RN: [birth year] (70 y.o. 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 - Dr. Heber [place] Friday Return A Edema Control - Lymphedema / SCD / Other Elevate legs to the level of the heart or above for 30 minutes daily and/or when sitting, a frequency of: - 3-4 times a day throughout the day. Avoid standing for long periods of time. Off-Loading Wedge shoe to: - right foot. Patient to wear while walking and  standing. May wear regular shoe while driving. Wound Treatment Wound #1 - T Great oe Wound Laterality: Right Cleanser: Soap and Water 1 x Per Day/30 Days Discharge Instructions: May shower and wash wound with dial antibacterial soap and water prior to dressing change. Prim Dressing: KerraCel Ag Gelling Fiber Dressing, 2x2 in (silver alginate) (Generic) 1 x Per Day/30 Days ary Discharge Instructions: ***Lightly pack into wound bed.***Apply silver alginate to wound bed as instructed Secondary Dressing: Woven Gauze Sponges 2x2 in (Generic) 1 x Per Day/30 Days Discharge Instructions: Apply over primary dressing as directed. Secondary Dressing: Optifoam Non-Adhesive Dressing, 4x4 in (Generic) 1 x Per Day/30 Days Discharge Instructions: Foam donut Secured With: Conforming Stretch Gauze Bandage, Sterile 2x75 (in/in) (Generic) 1 x Per Day/30 Days Discharge Instructions: Secure with stretch gauze as directed. Secured With: 15M Medipore H Soft Cloth Surgical T 4 x 2 (in/yd) (Generic) 1 x Per Day/30 Days ape Discharge Instructions: Secure dressing with tape as directed. Electronic Signature(s) Signed: 02/23/2021 2:18:53 PM By: Kalman Shan DO Entered By: Kalman Shan on 02/23/2021 14:15:37 -------------------------------------------------------------------------------- Problem List Details Patient Name: Date of Service: [name] 02/23/2021 1:15 PM Medical Record Number: [id number] Patient Account Number: [id number] Date of Birth/Sex: Treating RN: 1951/06/29 (70 y.o. 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 02/23/2021 1:15 PM Medical Record Number: [id number] Patient Account Number: [id number] Date of Birth/Sex: Treating RN: 26-Aug-1950 (70 y.o. [name] Primary Care Provider: [name] Other Clinician: Referring Provider: Treating Provider/Extender: [name] in Treatment: 1 Subjective Chief Complaint Information obtained from Patient Right great toe wound History of Present Illness (HPI) Admission 6/28 Mr. Piero Mustard is a 70 year old male with a past medical history of uncontrolled insulin-dependent type 2 diabetes that presents to the wound care center for a 4-month history of nonhealing wound  to the right great toe. He states he has been following with podiatry for this issue. He is currently using antibiotic ointment to the area. He is wearing crocs today. He denies Increased warmth, erythema or purulent drainage from the wound. 7/8; patient presents for 1 week follow-up. He had his x-ray of his right foot done today. He has an offloading shoe we gave him at last clinic visit that has cracked. Overall he is doing well. he has no issues with dressing changes. He denies signs of infection. Patient History Information obtained from Patient. Family History Cancer - Mother,Maternal Grandparents,Father, Hypertension - Mother, Lung Disease - Father, No family history of Diabetes, Heart Disease, Hereditary Spherocytosis, Kidney Disease, Seizures, Stroke, Thyroid Problems, Tuberculosis. Social History Former smoker - smokeless tobacoo, Marital Status - Single, Alcohol Use -  Rarely - beer, Drug Use - No History, Caffeine Use - Daily - coffee. Medical History Eyes Denies history of Cataracts, Glaucoma, Optic Neuritis Ear/Nose/Mouth/Throat Denies history of Chronic sinus problems/congestion, Middle ear problems Hematologic/Lymphatic Denies history of Anemia, Hemophilia, Human Immunodeficiency Virus, Lymphedema, Sickle Cell Disease Respiratory Denies history of Aspiration, Asthma, Chronic Obstructive Pulmonary Disease (COPD), Pneumothorax, Sleep Apnea, Tuberculosis Cardiovascular Patient has history of Myocardial Infarction - 01/10/2020 Denies history of Arrhythmia, Congestive Heart Failure, Coronary Artery Disease, Deep Vein Thrombosis, Hypertension, Hypotension, Peripheral Arterial Disease, Peripheral Venous Disease, Phlebitis, Vasculitis Gastrointestinal Denies history of Cirrhosis , Colitis, Crohnoos, Hepatitis A, Hepatitis B, Hepatitis C Endocrine Patient has history of Type II Diabetes Denies history of Type I Diabetes Genitourinary Denies history of End Stage Renal Disease Immunological Denies history of Lupus Erythematosus, Raynaudoos, Scleroderma Integumentary (Skin) Denies history of History of Burn Musculoskeletal Patient has history of Osteoarthritis Denies history of Gout, Rheumatoid Arthritis, Osteomyelitis Neurologic Denies history of Dementia, Neuropathy, Quadriplegia, Paraplegia, Seizure Disorder Oncologic Denies history of Received Chemotherapy, Received Radiation Psychiatric Denies history of Anorexia/bulimia, Confinement Anxiety Hospitalization/Surgery History - 01/09/2020 STEMI stent placement. Medical A Surgical History Notes nd Musculoskeletal carpel tunnel 2021 right wrist Oncologic Skin Ca 10/21 Objective Constitutional respirations regular, non-labored and within target range for patient.. Vitals Time Taken: 1:15 PM, Height: 73 in, Weight: 250 lbs, BMI: 33, Temperature: 97.9 F, Pulse: 80 bpm, Respiratory Rate: 20  breaths/min, Blood Pressure: 134/86 mmHg. Cardiovascular 2+ dorsalis pedis/posterior tibialis pulses. Psychiatric pleasant and cooperative. General Notes: Open wound to the right great toe with significant callus. Granulation tissue present with some nonviable tissue. No obvious signs of infection. Integumentary (Hair, Skin) Wound #1 status is Open. Original cause of wound was Blister. The date acquired was: 05/23/2020. The wound has been in treatment 1 weeks. The wound is located on the Right T Great. The wound measures 0.4cm length x 0.5cm width x 0.3cm depth; 0.157cm^2 area and 0.047cm^3 volume. There is Fat Layer oe (Subcutaneous Tissue) exposed. There is no tunneling noted, however, there is undermining starting at 12:00 and ending at 12:00 with a maximum distance of 0.3cm. There is a medium amount of serosanguineous drainage noted. The wound margin is distinct with the outline attached to the wound base. There is small (1-33%) pink, pale granulation within the wound bed. There is a small (1-33%) amount of necrotic tissue within the wound bed including Adherent Slough. General Notes: callous periwound. 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 There is more granulation tissue And less undermining  present. I recommend continuing silver alginate to this. We will give him another offloading shoe but I think this is helping very well and we can hold off on the cast for now. The x- ray did not show any signs of osteo. Procedures Wound #1 Pre-procedure diagnosis of Wound #1 is a Diabetic Wound/Ulcer of the Lower Extremity located on the Right T Great .Severity of Tissue Pre Debridement is: oe Fat layer exposed. There was a Excisional Skin/Subcutaneous Tissue Debridement  with a total area of 0.2 sq cm performed by Kalman Shan, DO. With the following instrument(s): Curette to remove Viable and Non-Viable tissue/material. Material removed includes Callus and Subcutaneous Tissue and. No specimens were taken. A time out was conducted at 13:57, prior to the start of the procedure. A Minimum amount of bleeding was controlled with Pressure. The procedure was tolerated well with a pain level of 0 throughout and a pain level of 0 following the procedure. Post Debridement Measurements: 0.4cm length x 0.5cm width x 0.3cm depth; 0.047cm^3 volume. Character of Wound/Ulcer Post Debridement is improved. Severity of Tissue Post Debridement is: Fat layer exposed. Post procedure Diagnosis Wound #1: Same as Pre-Procedure Plan Follow-up Appointments: Return Appointment in 1 week. - Dr. Heber [place] Friday Edema Control - Lymphedema / SCD / Other: Elevate legs to the level of the heart or above for 30 minutes daily and/or when sitting, a frequency of: - 3-4 times a day throughout the day. Avoid standing for long periods of time. Off-Loading: Wedge shoe to: - right foot. Patient to wear while walking and standing. May wear regular shoe while driving. WOUND #1: - T Great Wound Laterality: Right oe Cleanser: Soap and Water 1 x Per Day/30 Days Discharge Instructions: May shower and wash wound with dial antibacterial soap and water prior to dressing change. Prim Dressing: KerraCel Ag Gelling Fiber Dressing, 2x2 in (silver alginate) (Generic) 1 x Per Day/30 Days ary Discharge Instructions: ***Lightly pack into wound bed.***Apply silver alginate to wound bed as instructed Secondary Dressing: Woven Gauze Sponges 2x2 in (Generic) 1 x Per Day/30 Days Discharge Instructions: Apply over primary dressing as directed. Secondary Dressing: Optifoam Non-Adhesive Dressing, 4x4 in (Generic) 1 x Per Day/30 Days Discharge Instructions: Foam donut Secured With: Conforming Stretch Gauze Bandage,  Sterile 2x75 (in/in) (Generic) 1 x Per Day/30 Days Discharge Instructions: Secure with stretch gauze as directed. Secured With: 36M Medipore H Soft Cloth Surgical T 4 x 2 (in/yd) (Generic) 1 x Per Day/30 Days ape Discharge Instructions: Secure dressing with tape as directed. 1. In office sharp debridement 2. Continue silver alginate 3. Offloading shoe 4. Follow-up in 1 week Electronic Signature(s) Signed: 02/23/2021 2:18:53 PM By: Kalman Shan DO Entered By: Kalman Shan on 02/23/2021 14:18:03 -------------------------------------------------------------------------------- HxROS Details Patient Name: Date of Service: [name] 02/23/2021 1:15 PM Medical Record Number: [id number] Patient Account Number: [id number] Date of Birth/Sex: Treating RN: 1951-01-19 (70 y.o. [name] Primary Care Provider: [name] Other Clinician: Referring Provider: Treating Provider/Extender: [name] in Treatment: 1 Information Obtained From Patient Eyes Medical History: Negative for: Cataracts; Glaucoma; Optic Neuritis Ear/Nose/Mouth/Throat Medical History: Negative for: Chronic sinus problems/congestion; Middle ear problems Hematologic/Lymphatic Medical History: Negative for: Anemia; Hemophilia; Human Immunodeficiency Virus; Lymphedema; Sickle Cell Disease Respiratory Medical History: Negative for: Aspiration; Asthma; Chronic Obstructive Pulmonary Disease (COPD); Pneumothorax; Sleep Apnea; Tuberculosis Cardiovascular Medical History: Positive for: Myocardial Infarction - 01/10/2020 Negative for: Arrhythmia; Congestive Heart Failure; Coronary Artery Disease; Deep Vein Thrombosis; Hypertension; Hypotension; Peripheral Arterial Disease;  Peripheral Venous Disease; Phlebitis; Vasculitis Gastrointestinal Medical History: Negative for: Cirrhosis ; Colitis; Crohns; Hepatitis A; Hepatitis B; Hepatitis C Endocrine Medical History: Positive for:  Type II Diabetes Negative for: Type I Diabetes Time with diabetes: dx 2013 Treated with: Oral agents, Diet Blood sugar tested every day: Yes Tested : daily Genitourinary Medical History: Negative for: End Stage Renal Disease Immunological Medical History: Negative for: Lupus Erythematosus; Raynauds; Scleroderma Integumentary (Skin) Medical History: Negative for: History of Burn Musculoskeletal Medical History: Positive for: Osteoarthritis Negative for: Gout; Rheumatoid Arthritis; Osteomyelitis Past Medical History Notes: carpel tunnel 2021 right wrist Neurologic Medical History: Negative for: Dementia; Neuropathy; Quadriplegia; Paraplegia; Seizure Disorder Oncologic Medical History: Negative for: Received Chemotherapy; Received Radiation Past Medical History Notes: Skin Ca 10/21 Psychiatric Medical History: Negative for: Anorexia/bulimia; Confinement Anxiety Immunizations Pneumococcal Vaccine: Received Pneumococcal Vaccination: Yes Implantable Devices No devices added Hospitalization / Surgery History Type of Hospitalization/Surgery 01/09/2020 STEMI stent placement Family and Social History Cancer: Yes - Mother,Maternal Grandparents,Father; Diabetes: No; Heart Disease: No; Hereditary Spherocytosis: No; Hypertension: Yes - Mother; Kidney Disease: No; Lung Disease: Yes - Father; Seizures: No; Stroke: No; Thyroid Problems: No; Tuberculosis: No; Former smoker - smokeless tobacoo; Marital Status - Single; Alcohol Use: Rarely - beer; Drug Use: No History; Caffeine Use: Daily - coffee; Financial Concerns: No; Food, Clothing or Shelter Needs: No; Support System Lacking: No; Transportation Concerns: No Electronic Signature(s) Signed: 02/23/2021 2:18:53 PM By: Kalman Shan DO Signed: 02/27/2021 5:43:47 PM By: Levan Hurst RN, BSN Entered By: Kalman Shan on 02/23/2021 14:14:51 -------------------------------------------------------------------------------- SuperBill  Details Patient Name: Date of Service: CA [name] 02/23/2021 Medical Record Number: [id number] Patient Account Number: [id number] Date of Birth/Sex: Treating RN: 1951-08-01 (70 y.o. 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 I10 Essential (primary) hypertension Facility Procedures CPT4 Code: [id number] Description: 20254 - DEB SUBQ TISSUE 20 SQ CM/< ICD-10 Diagnosis Description E11.621 Type 2 diabetes mellitus with foot ulcer L97.519 Non-pressure chronic ulcer of other part of right foot with unspecified severi I10 Essential (primary) hypertension Modifier: ty Quantity: 1 Physician Procedures : CPT4 Code Description Modifier [id number] - WC PHYS SUBQ TISS 20 SQ CM ICD-10 Diagnosis Description E11.621 Type 2 diabetes mellitus with foot ulcer L97.519 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

## 2021-02-27 NOTE — Progress Notes (Signed)
[name], [name] L. ([id number]) Visit Report for 02/23/2021 Arrival Information Details Patient Name: Date of Service: [name], Green 02/23/2021 1:15 PM Medical Record Number: [id number] Patient Account Number: [id number] Date of Birth/Sex: Treating RN: [birth year] (70 y.o. [name] Primary Care Winter Trefz: [name] Other Clinician: Referring Verneice Caspers: Treating Kynzee Devinney/Extender: [name] in Treatment: 1 Visit Information History Since Last Visit Added or deleted any medications: No Patient Arrived: Ambulatory Any new allergies or adverse reactions: No Arrival Time: 13:15 Had a fall or experienced change in No Accompanied By: self activities of daily living that may affect Transfer Assistance: None risk of falls: Patient Identification Verified: Yes Signs or symptoms of abuse/neglect since last visito No Secondary Verification Process Completed: Yes Hospitalized since last visit: No Patient Requires Transmission-Based Precautions: No Implantable device outside of the clinic excluding No Patient Has Alerts: No cellular tissue based products placed in the center since last visit: Has Dressing in Place as Prescribed: Yes Has Footwear/Offloading in Place as Prescribed: Yes Right: Wedge Shoe Pain Present Now: No Electronic Signature(s) Signed: 02/23/2021 5:32:25 PM By: Deon Pilling Entered By: Deon Pilling on 02/23/2021 13:24:54 -------------------------------------------------------------------------------- Encounter Discharge Information Details Patient Name: Date of Service: [name]. 02/23/2021 1:15 PM Medical Record Number: [id number] Patient Account Number: [id number] Date of Birth/Sex: Treating RN: [birth year] (70 y.o. [name] Primary Care Michelene Keniston: [name] Other Clinician: Referring [name]: Treating [name]/Extender: [name] in Treatment: 1 Encounter Discharge Information  Items Post Procedure Vitals Discharge Condition: Stable Temperature (F): 97.9 Ambulatory Status: Ambulatory Pulse (bpm): 80 Discharge Destination: Home Respiratory Rate (breaths/min): 20 Transportation: Private Auto Blood Pressure (mmHg): 134/86 Accompanied By: self Schedule Follow-up Appointment: Yes Clinical Summary of Care: Electronic Signature(s) Signed: 02/23/2021 5:32:25 PM By: Deon Pilling Entered By: Deon Pilling on 02/23/2021 17:09:09 -------------------------------------------------------------------------------- Lower Extremity Assessment Details Patient Name: Date of Service: [name], Green 02/23/2021 1:15 PM Medical Record Number: [id number] Patient Account Number: [id number] Date of Birth/Sex: Treating RN: 09-15-1950 (70 y.o. [name] Primary Care Jeaneane Adamec: [name] Other Clinician: Referring Latham Kinzler: Treating Aunisty Reali/Extender: [name] in Treatment: 1 Edema Assessment Assessed: [Left: No] Patrice Paradise: Yes] Edema: [Left: N] [Right: o] Calf Left: Right: Point of Measurement: 32 cm From Medial Instep 42 cm Ankle Left: Right: Point of Measurement: 11 cm From Medial Instep 24.5 cm Vascular Assessment Pulses: Dorsalis Pedis Palpable: [Right:Yes] Electronic Signature(s) Signed: 02/23/2021 5:32:25 PM By: Deon Pilling Entered By: Deon Pilling on 02/23/2021 13:25:36 -------------------------------------------------------------------------------- Multi Wound Chart Details Patient Name: Date of Service: [name]. 02/23/2021 1:15 PM Medical Record Number: [id number] Patient Account Number: [id number] Date of Birth/Sex: Treating RN: Jun 30, 1951 (70 y.o. [name] Primary Care [name]: [name] Other Clinician: Referring Bergen Melle: Treating Elisabeth Strom/Extender: [name] in Treatment: 1 Vital Signs Height(in): 73 Pulse(bpm): 80 Weight(lbs): 250 Blood Pressure(mmHg):  134/86 Body Mass Index(BMI): 33 Temperature(F): 97.9 Respiratory Rate(breaths/min): 20 Photos: [1:No Photos Right T Great oe] [N/A:N/A N/A] Wound Location: [1:Blister] [N/A:N/A] Wounding Event: [1:Diabetic Wound/Ulcer of the Lower] [N/A:N/A] Primary Etiology: [1:Extremity Myocardial Infarction, Type II] [N/A:N/A] Comorbid History: [1:Diabetes, Osteoarthritis 05/23/2020] [N/A:N/A] Date Acquired: [1:1] [N/A:N/A] Weeks of Treatment: [1:Open] [N/A:N/A] Wound Status: [1:0.4x0.5x0.3] [N/A:N/A] Measurements L x W x D (cm) [1:0.157] [N/A:N/A] A (cm) : rea [1:0.047] [N/A:N/A] Volume (cm) : [1:0.00%] [N/A:N/A] % Reduction in A rea: [1:50.00%] [N/A:N/A] % Reduction in Volume: [1:12] Starting Position 1 (o'clock): [1:12] Ending Position 1 (o'clock): [1:0.3]  Maximum Distance 1 (cm): [1:Yes] [N/A:N/A] Undermining: [1:Grade 2] [N/A:N/A] Classification: [1:Medium] [N/A:N/A] Exudate A mount: [1:Serosanguineous] [N/A:N/A] Exudate Type: [1:red, brown] [N/A:N/A] Exudate Color: [1:Distinct, outline attached] [N/A:N/A] Wound Margin: [1:Small (1-33%)] [N/A:N/A] Granulation A mount: [1:Pink, Pale] [N/A:N/A] Granulation Quality: [1:Small (1-33%)] [N/A:N/A] Necrotic A mount: [1:Fat Layer (Subcutaneous Tissue): Yes N/A] Exposed Structures: [1:Fascia: No Tendon: No Muscle: No Joint: No Bone: No None] [N/A:N/A] Epithelialization: [1:Debridement - Excisional] [N/A:N/A] Debridement: Pre-procedure Verification/Time Out 13:57 [N/A:N/A] Taken: [1:Callus, Subcutaneous] [N/A:N/A] Tissue Debrided: [1:Skin/Subcutaneous Tissue] [N/A:N/A] Level: [1:0.2] [N/A:N/A] Debridement A (sq cm): [1:rea Curette] [N/A:N/A] Instrument: [1:Minimum] [N/A:N/A] Bleeding: [1:Pressure] [N/A:N/A] Hemostasis A chieved: [1:0] [N/A:N/A] Procedural Pain: [1:0] [N/A:N/A] Post Procedural Pain: [1:Procedure was tolerated well] [N/A:N/A] Debridement Treatment Response: [1:0.4x0.5x0.3] [N/A:N/A] Post Debridement Measurements L x W x D  (cm) [1:0.047] [N/A:N/A] Post Debridement Volume: (cm) [1:callous periwound.] [N/A:N/A] Assessment Notes: [1:Debridement] [N/A:N/A] Treatment Notes Electronic Signature(s) Signed: 02/23/2021 2:18:53 PM By: Kalman Shan DO Signed: 02/27/2021 5:43:47 PM By: Levan Hurst RN, BSN Entered By: Kalman Shan on 02/23/2021 14:13:56 -------------------------------------------------------------------------------- Multi-Disciplinary Care Plan Details Patient Name: Date of Service: [name]. 02/23/2021 1:15 PM Medical Record Number: [id number] Patient Account Number: [id number] Date of Birth/Sex: Treating RN: 1950/12/21 (70 y.o. [name] Primary Care Rosaisela Jamroz: [name] Other Clinician: Referring Deloros Beretta: Treating Yvonne Stopher/Extender: [name] in Treatment: 1 Active Inactive Abuse / Safety / Falls / Self Care Management Nursing Diagnoses: Impaired physical mobility Potential for falls Goals: Patient will remain injury free related to falls Date Initiated: 02/13/2021 Target Resolution Date: 03/15/2021 Goal Status: Active Interventions: Assess fall risk on admission and as needed Assess: immobility, friction, shearing, incontinence upon admission and as needed Provide education on fall prevention Notes: Nutrition Nursing Diagnoses: Potential for alteratiion in Nutrition/Potential for imbalanced nutrition Goals: Patient/caregiver agrees to and verbalizes understanding of need to obtain nutritional consultation Date Initiated: 02/13/2021 Target Resolution Date: 02/23/2021 Goal Status: Active Interventions: Provide education on elevated blood sugars and impact on wound healing Provide education on nutrition Treatment Activities: Obtain HgA1c : 02/13/2021 Patient referred to Primary Care Physician for further nutritional evaluation : 02/13/2021 Notes: Orientation to the Wound Care Program Nursing Diagnoses: Knowledge deficit related  to the wound healing center program Goals: Patient/caregiver will verbalize understanding of the [facility] Date Initiated: 02/13/2021 Target Resolution Date: 02/23/2021 Goal Status: Active Interventions: Provide education on orientation to the wound center Notes: Pain, Acute or Chronic Nursing Diagnoses: Pain, acute or chronic: actual or potential Potential alteration in comfort, pain Goals: Patient will verbalize adequate pain control and receive pain control interventions during procedures as needed Date Initiated: 02/13/2021 Target Resolution Date: 02/22/2021 Goal Status: Active Interventions: Encourage patient to take pain medications as prescribed Provide education on pain management Reposition patient for comfort Treatment Activities: Administer pain control measures as ordered : 02/13/2021 Notes: Wound/Skin Impairment Nursing Diagnoses: Knowledge deficit related to ulceration/compromised skin integrity Goals: Patient/caregiver will verbalize understanding of skin care regimen Date Initiated: 02/13/2021 Target Resolution Date: 02/23/2021 Goal Status: Active Interventions: Assess patient/caregiver ability to perform ulcer/skin care regimen upon admission and as needed Assess ulceration(s) every visit Provide education on ulcer and skin care Treatment Activities: Skin care regimen initiated : 02/13/2021 Topical wound management initiated : 02/13/2021 Notes: Electronic Signature(s) Signed: 02/27/2021 5:43:47 PM By: Levan Hurst RN, BSN Entered By: Levan Hurst on 02/23/2021 14:23:12 -------------------------------------------------------------------------------- Pain Assessment Details Patient Name: Date of Service: [name]. 02/23/2021 1:15 PM Medical Record Number: [id number] Patient Account Number: [id number]  Date of Birth/Sex: Treating RN: 1950/12/15 (70 y.o. [name] Primary Care Phylisha Dix: [name] Other Clinician: Referring  Markise Haymer: Treating Matei Magnone/Extender: [name] in Treatment: 1 Active Problems Location of Pain Severity and Description of Pain Patient Has Paino No Site Locations Rate the pain. Current Pain Level: 0 Pain Management and Medication Current Pain Management: Medication: No Cold Application: No Rest: No Massage: No Activity: No T.E.N.S.: No Heat Application: No Leg drop or elevation: No Is the Current Pain Management Adequate: Adequate How does your wound impact your activities of daily livingo Sleep: No Bathing: No Appetite: No Relationship With Others: No Bladder Continence: No Emotions: No Bowel Continence: No Work: No Toileting: No Drive: No Dressing: No Hobbies: No Electronic Signature(s) Signed: 02/23/2021 5:32:25 PM By: Deon Pilling Entered By: Deon Pilling on 02/23/2021 13:25:24 -------------------------------------------------------------------------------- Patient/Caregiver Education Details Patient Name: Date of Service: [name] 7/8/2022andnbsp1:15 PM Medical Record Number: [id number] Patient Account Number: [id number] Date of Birth/Gender: Treating RN: 12-Jun-1951 (70 y.o. [name] Primary Care Physician: [name] Other Clinician: Referring Physician: Treating Physician/Extender: [name] in Treatment: 1 Education Assessment Education Provided To: Patient Education Topics Provided Wound/Skin Impairment: Methods: Explain/Verbal Responses: State content correctly Electronic Signature(s) Signed: 02/27/2021 5:43:47 PM By: Levan Hurst RN, BSN Entered By: Levan Hurst on 02/23/2021 14:23:30 -------------------------------------------------------------------------------- Wound Assessment Details Patient Name: Date of Service: [name] 02/23/2021 1:15 PM Medical Record Number: [id number] Patient Account Number: [id number] Date of Birth/Sex: Treating  RN: 11/01/1950 (70 y.o. [name] Primary Care Haroldine Redler: [name] Other Clinician: Referring Nekeya Briski: Treating Osborne Serio/Extender: [name] in Treatment: 1 Wound Status Wound Number: 1 Primary Etiology: Diabetic Wound/Ulcer of the Lower Extremity Wound Location: Right T Great oe Wound Status: Open Wounding Event: Blister Comorbid History: Myocardial Infarction, Type II Diabetes, Osteoarthritis Date Acquired: 05/23/2020 Weeks Of Treatment: 1 Clustered Wound: No Photos Wound Measurements Length: (cm) 0.4 Width: (cm) 0.5 Depth: (cm) 0.3 Area: (cm) 0.157 Volume: (cm) 0.047 % Reduction in Area: 0% % Reduction in Volume: 50% Epithelialization: None Tunneling: No Undermining: Yes Starting Position (o'clock): 12 Ending Position (o'clock): 12 Maximum Distance: (cm) 0.3 Wound Description Classification: Grade 2 Wound Margin: Distinct, outline attached Exudate Amount: Medium Exudate Type: Serosanguineous Exudate Color: red, brown Foul Odor After Cleansing: No Slough/Fibrino Yes Wound Bed Granulation Amount: Small (1-33%) Exposed Structure Granulation Quality: Pink, Pale Fascia Exposed: No Necrotic Amount: Small (1-33%) Fat Layer (Subcutaneous Tissue) Exposed: Yes Necrotic Quality: Adherent Slough Tendon Exposed: No Muscle Exposed: No Joint Exposed: No Bone Exposed: No Assessment Notes callous periwound. Treatment Notes Wound #1 (Toe Great) Wound Laterality: Right Cleanser Soap and Water Discharge Instruction: May shower and wash wound with dial antibacterial soap and water prior to dressing change. Peri-Wound Care Topical Primary Dressing KerraCel Ag Gelling Fiber Dressing, 2x2 in (silver alginate) Discharge Instruction: ***Lightly pack into wound bed.***Apply silver alginate to wound bed as instructed Secondary Dressing Woven Gauze Sponges 2x2 in Discharge Instruction: Apply over primary dressing as directed. Optifoam  Non-Adhesive Dressing, 4x4 in Discharge Instruction: Foam donut Secured With Conforming Stretch Gauze Bandage, Sterile 2x75 (in/in) Discharge Instruction: Secure with stretch gauze as directed. 18M Medipore H Soft Cloth Surgical T 4 x 2 (in/yd) ape Discharge Instruction: Secure dressing with tape as directed. Compression Wrap Compression Stockings Add-Ons Electronic Signature(s) Signed: 02/23/2021 5:16:50 PM By: Sandre Kitty Signed: 02/23/2021 5:32:25 PM By: Deon Pilling Entered By: Sandre Kitty on 02/23/2021 17:11:05 -------------------------------------------------------------------------------- Vitals Details  Patient Name: Date of Service: MARVENS, HOLLARS 02/23/2021 1:15 PM Medical Record Number: [id number] Patient Account Number: [id number] Date of Birth/Sex: Treating RN: [birth year] (70 y.o. [name] Primary Care Omah Dewalt: [name] Other Clinician: Referring Katrinna Travieso: Treating Aubryanna Nesheim/Extender: [name] in Treatment: 1 Vital Signs Time Taken: 13:15 Temperature (F): 97.9 Height (in): 73 Pulse (bpm): 80 Weight (lbs): 250 Respiratory Rate (breaths/min): 20 Body Mass Index (BMI): 33 Blood Pressure (mmHg): 134/86 Reference Range: 80 - 120 mg / dl Electronic Signature(s) Signed: 02/23/2021 5:32:25 PM By: Deon Pilling Entered By: Deon Pilling on 02/23/2021 13:25:16

## 2021-03-01 ENCOUNTER — Other Ambulatory Visit: Payer: Self-pay

## 2021-03-01 ENCOUNTER — Encounter (HOSPITAL_BASED_OUTPATIENT_CLINIC_OR_DEPARTMENT_OTHER): Payer: HMO | Admitting: Internal Medicine

## 2021-03-01 DIAGNOSIS — L97519 Non-pressure chronic ulcer of other part of right foot with unspecified severity: Secondary | ICD-10-CM | POA: Diagnosis not present

## 2021-03-01 DIAGNOSIS — E11621 Type 2 diabetes mellitus with foot ulcer: Secondary | ICD-10-CM | POA: Diagnosis not present

## 2021-03-05 NOTE — Progress Notes (Signed)
Lager, Pax L. ([id number]) Visit Report for 03/01/2021 Chief Complaint Document Details Patient Name: Date of Service: [name], [name] 03/01/2021 1:45 PM Medical Record Number: [id number] Patient Account Number: [id number] Date of Birth/Sex: Treating RN: Feb 09, 1951 (70 y.o. [name] Primary Care Provider: [name] Other Clinician: Referring Provider: Treating Provider/Extender: [name] in Treatment: 2 Information Obtained from: Patient Chief Complaint Right great toe wound Electronic Signature(s) Signed: 03/02/2021 1:58:04 PM By: Kalman Shan DO Entered By: Kalman Shan on 03/02/2021 13:52:12 -------------------------------------------------------------------------------- Debridement Details Patient Name: Date of Service: [name] [name] 03/01/2021 1:45 PM Medical Record Number: [id number] Patient Account Number: [id number] Date of Birth/Sex: Treating RN: 01-11-1951 (70 y.o. [name], [name] Primary Care Provider: [name] Other Clinician: Referring Provider: Treating Provider/Extender: [name] in Treatment: 2 Debridement Performed for Assessment: Wound #1 Right T Great oe Performed By: Physician Kalman Shan, DO Debridement Type: Debridement Severity of Tissue Pre Debridement: Fat layer exposed Level of Consciousness (Pre-procedure): Awake and Alert Pre-procedure Verification/Time Out Yes - 15:11 Taken: Start Time: 15:11 Pain Control: Lidocaine T Area Debrided (L x W): otal 0.3 (cm) x 0.5 (cm) = 0.15 (cm) Tissue and other material debrided: Viable, Non-Viable, Callus, Slough, Subcutaneous, Skin: Dermis , Skin: Epidermis, Slough Level: Skin/Subcutaneous Tissue Debridement Description: Excisional Instrument: Curette Bleeding: Minimum Hemostasis Achieved: Pressure End Time: 15:11 Procedural Pain: 0 Post Procedural Pain: 0 Response to Treatment: Procedure was  tolerated well Level of Consciousness (Post- Awake and Alert procedure): Post Debridement Measurements of Total Wound Length: (cm) 0.3 Width: (cm) 0.5 Depth: (cm) 0.2 Volume: (cm) 0.024 Character of Wound/Ulcer Post Debridement: Improved Severity of Tissue Post Debridement: Fat layer exposed Post Procedure Diagnosis Same as Pre-procedure Electronic Signature(s) Signed: 03/01/2021 6:01:49 PM By: Rhae Hammock RN Signed: 03/02/2021 1:58:04 PM By: Kalman Shan DO Entered By: Rhae Hammock on 03/01/2021 15:12:25 -------------------------------------------------------------------------------- HPI Details Patient Name: Date of Service: [name] [name] 03/01/2021 1:45 PM Medical Record Number: [id number] Patient Account Number: [id number] Date of Birth/Sex: Treating RN: 1951-06-27 (70 y.o. [name], [name] Primary Care Provider: [name] Other Clinician: Referring Provider: Treating Provider/Extender: [name] in Treatment: 2 History of Present Illness HPI Description: Admission 6/28 [name] is a 70 year old male with a past medical history of uncontrolled insulin-dependent type 2 diabetes that presents to the wound care center for a 47-month history of nonhealing wound to the right great toe. He states he has been following with podiatry for this issue. He is currently using antibiotic ointment to the area. He is wearing crocs today. He denies Increased warmth, erythema or purulent drainage from the wound. 7/8; patient presents for 1 week follow-up. He had his x-ray of his right foot done today. He has an offloading shoe we gave him at last clinic visit that has cracked. Overall he is doing well. he has no issues with dressing changes. He denies signs of infection. 7/15; patient presents for 1 week follow-up. He continues to use his offloading shoe daily. He reports improvement to the wound size. He has no issues with dressing  changes and denies signs of infection. Electronic Signature(s) Signed: 03/02/2021 1:58:04 PM By: Kalman Shan DO Entered By: Kalman Shan on 03/02/2021 13:53:19 -------------------------------------------------------------------------------- Physical Exam Details Patient Name: Date of Service: [name] [name] 03/01/2021 1:45 PM Medical Record Number: [id number] Patient Account Number: [id number] Date of Birth/Sex: Treating RN: [birth year] (70 y.o. [name] Primary Care Provider: Mannie Stabile,  Apolonio Schneiders Other Clinician: Referring Provider: Treating Provider/Extender: [name] in Treatment: 2 Constitutional respirations regular, non-labored and within target range for patient.. Cardiovascular 2+ dorsalis pedis/posterior tibialis pulses. Psychiatric pleasant and cooperative. Notes Open wound to the right great toe with callus. Granulation tissue present with some nonviable tissue. No obvious signs of infection. Electronic Signature(s) Signed: 03/02/2021 1:58:04 PM By: Kalman Shan DO Entered By: Kalman Shan on 03/02/2021 13:53:57 -------------------------------------------------------------------------------- Physician Orders Details Patient Name: Date of Service: [name] [name] 03/01/2021 1:45 PM Medical Record Number: [id number] Patient Account Number: [id number] Date of Birth/Sex: Treating RN: 1950-11-25 (70 y.o. 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 - Dr. Heber Hickman Return A Edema Control -  Lymphedema / SCD / Other Elevate legs to the level of the heart or above for 30 minutes daily and/or when sitting, a frequency of: - 3-4 times a day throughout the day. Avoid standing for long periods of time. Off-Loading Wedge shoe to: - right foot. Patient to wear while walking and standing. May wear regular shoe while driving. Wound Treatment Wound #1 - T Great oe Wound Laterality: Right Cleanser: Soap and Water 1 x Per Day/30 Days Discharge Instructions: May shower and wash wound with dial antibacterial soap and water prior to dressing change. Prim Dressing: KerraCel Ag Gelling Fiber Dressing, 2x2 in (silver alginate) (Generic) 1 x Per Day/30 Days ary Discharge Instructions: ***Lightly pack into wound bed.***Apply silver alginate to wound bed as instructed Secondary Dressing: Woven Gauze Sponges 2x2 in (Generic) 1 x Per Day/30 Days Discharge Instructions: Apply over primary dressing as directed. Secondary Dressing: Optifoam Non-Adhesive Dressing, 4x4 in (Generic) 1 x Per Day/30 Days Discharge Instructions: Foam donut Secured With: Conforming Stretch Gauze Bandage, Sterile 2x75 (in/in) (Generic) 1 x Per Day/30 Days Discharge Instructions: Secure with stretch gauze as directed. Secured With: 68M Medipore H Soft Cloth Surgical T 4 x 2 (in/yd) (Generic) 1 x Per Day/30 Days ape Discharge Instructions: Secure dressing with tape as directed. Electronic Signature(s) Signed: 03/02/2021 1:58:04 PM By: Kalman Shan DO Previous Signature: 03/01/2021 6:01:49 PM Version By: Rhae Hammock RN Entered By: Kalman Shan on 03/02/2021 13:54:39 -------------------------------------------------------------------------------- Problem List Details Patient Name: Date of Service: [name] [name]. 03/01/2021 1:45 PM Medical Record Number: [id number] Patient Account Number: [id number] Date of Birth/Sex: Treating RN: 10-17-1950 (70 y.o. 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 03/01/2021 1:45 PM Medical Record Number: [id number] Patient Account Number: [id number] Date of Birth/Sex: Treating RN: Jan 09, 1951 (70 y.o. [name] Primary Care Provider: [name] Other Clinician: Referring Provider: Treating Provider/Extender: [name] in Treatment:  2 Subjective Chief Complaint Information obtained from Patient Right great toe wound History of Present Illness (HPI) Admission 6/28 Mr. Gurveer Colucci is a 70 year old male with a past medical history of uncontrolled insulin-dependent type 2 diabetes that presents to the wound care center for a 62-month history of nonhealing wound to the right great toe. He states he has been following with podiatry for this issue. He is currently using antibiotic ointment to the area. He is wearing crocs today. He denies Increased warmth, erythema or purulent drainage from the wound. 7/8; patient presents for 1 week follow-up. He had his x-ray of his right foot done today. He has an offloading shoe we gave him at last clinic visit that has cracked. Overall he is doing well. he has no issues with dressing changes. He denies signs of infection. 7/15; patient presents  for 1 week follow-up. He continues to use his offloading shoe daily. He reports improvement to the wound size. He has no issues with dressing changes and denies signs of infection. Patient History Information obtained from Patient. Family History Cancer - Mother,Maternal Grandparents,Father, Hypertension - Mother, Lung Disease - Father, No family history of Diabetes, Heart Disease, Hereditary Spherocytosis, Kidney Disease, Seizures, Stroke, Thyroid Problems, Tuberculosis. Social History Former smoker - smokeless tobacoo, Marital Status - Single, Alcohol Use - Rarely - beer, Drug Use - No History, Caffeine Use - Daily - coffee. Medical History Eyes Denies history of Cataracts, Glaucoma, Optic Neuritis Ear/Nose/Mouth/Throat Denies history of Chronic sinus problems/congestion, Middle ear problems Hematologic/Lymphatic Denies history of Anemia, Hemophilia, Human Immunodeficiency Virus, Lymphedema, Sickle Cell Disease Respiratory Denies history of Aspiration, Asthma, Chronic Obstructive Pulmonary Disease (COPD), Pneumothorax, Sleep Apnea, Tuberculosis Cardiovascular Patient has history of Myocardial Infarction - 01/10/2020 Denies history of Arrhythmia, Congestive Heart Failure, Coronary Artery Disease, Deep Vein Thrombosis, Hypertension, Hypotension, Peripheral Arterial Disease, Peripheral Venous Disease, Phlebitis, Vasculitis Gastrointestinal Denies history of Cirrhosis , Colitis, Crohnoos, Hepatitis A, Hepatitis B, Hepatitis C Endocrine Patient has history of Type II Diabetes Denies history of Type I Diabetes Genitourinary Denies history of End Stage Renal Disease Immunological Denies history of Lupus Erythematosus, Raynaudoos, Scleroderma Integumentary (Skin) Denies history of History of Burn Musculoskeletal Patient has history of Osteoarthritis Denies history of Gout, Rheumatoid Arthritis, Osteomyelitis Neurologic Denies history of Dementia, Neuropathy, Quadriplegia, Paraplegia,  Seizure Disorder Oncologic Denies history of Received Chemotherapy, Received Radiation Psychiatric Denies history of Anorexia/bulimia, Confinement Anxiety Hospitalization/Surgery History - 01/09/2020 STEMI stent placement. Medical A Surgical History Notes nd Musculoskeletal carpel tunnel 2021 right wrist Oncologic Skin [name] 10/21 Objective Constitutional respirations regular, non-labored and within target range for patient.. Vitals Time Taken: 2:38 PM, Height: 73 in, Weight: 250 lbs, BMI: 33, Temperature: 98.5 F, Pulse: 73 bpm, Respiratory Rate: 20 breaths/min, Blood Pressure: 150/94 mmHg. Cardiovascular 2+ dorsalis pedis/posterior tibialis pulses. Psychiatric pleasant and cooperative. General Notes: Open wound to the right great toe with callus. Granulation tissue present with some nonviable tissue. No obvious signs of infection. Integumentary (Hair, Skin) Wound #1 status is Open. Original cause of wound was Blister. The date acquired was: 05/23/2020. The wound has been in treatment 2 weeks. The wound is located on the Right T Great. The wound measures 0.3cm length x 0.5cm width x 0.2cm depth; 0.118cm^2 area and 0.024cm^3 volume. There is Fat Layer oe (Subcutaneous Tissue) exposed. There is no tunneling or undermining noted. There is a medium amount of serosanguineous drainage noted. The wound margin is distinct with the outline attached to the wound base. There is large (67-100%) pink, pale granulation  within the wound bed. There is a small (1-33%) amount of necrotic tissue within the wound bed including Adherent Slough. 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 There was callus and I was able to remove some of  this. I debrided nonviable tissue. I recommended continuing to offload the area with his offloading shoe and silver alginate daily dressing changes. Patient has been doing an excellent job with his wound care. Procedures Wound #1 Pre-procedure diagnosis of Wound #1 is a Diabetic Wound/Ulcer of the Lower Extremity located on the Right T Great .Severity of Tissue Pre Debridement is: oe Fat layer exposed. There was a Excisional Skin/Subcutaneous Tissue Debridement with a total area of 0.15 sq cm performed by Kalman Shan, DO. With the following instrument(s): Curette to remove Viable and Non-Viable tissue/material. Material removed includes Callus, Subcutaneous Tissue, Slough, Skin: Dermis, and Skin: Epidermis after achieving pain control using Lidocaine. No specimens were taken. A time out was conducted at 15:11, prior to the start of the procedure. A Minimum amount of bleeding was controlled with Pressure. The procedure was tolerated well with a pain level of 0 throughout and a pain level of 0 following the procedure. Post Debridement Measurements: 0.3cm length x 0.5cm width x 0.2cm depth; 0.024cm^3 volume. Character of Wound/Ulcer Post Debridement is improved. Severity of Tissue Post Debridement is: Fat layer exposed. Post procedure Diagnosis Wound #1: Same as Pre-Procedure Plan Follow-up Appointments: Return Appointment in 1 week. - Dr. [facility] Edema Control - Lymphedema / SCD / Other: Elevate legs to the level of the heart or above for 30 minutes daily and/or when sitting, a frequency of: - 3-4 times a day throughout the day. Avoid standing for long periods of time. Off-Loading: Wedge shoe to: - right foot. Patient to wear while walking and standing. May wear regular shoe while driving. WOUND #1: - T Great Wound Laterality: Right oe Cleanser: Soap and Water 1 x Per Day/30 Days Discharge Instructions: May shower and wash wound with dial antibacterial soap and water prior to dressing  change. Prim Dressing: KerraCel Ag Gelling Fiber Dressing, 2x2 in (silver alginate) (Generic) 1 x Per Day/30 Days ary Discharge Instructions: ***Lightly pack into wound bed.***Apply silver alginate to wound bed as instructed Secondary Dressing: Woven Gauze Sponges 2x2 in (Generic) 1 x Per Day/30 Days Discharge Instructions: Apply over primary dressing as directed. Secondary Dressing: Optifoam Non-Adhesive Dressing, 4x4 in (Generic) 1 x Per Day/30 Days Discharge Instructions: Foam donut Secured With: Conforming Stretch Gauze Bandage, Sterile 2x75 (in/in) (Generic) 1 x Per Day/30 Days Discharge Instructions: Secure with stretch gauze as directed. Secured With: 78M Medipore H Soft Cloth Surgical T 4 x 2 (in/yd) (Generic) 1 x Per Day/30 Days ape Discharge Instructions: Secure dressing with tape as directed. 1. In office sharp debridement 2. Silver alginate 3. Offloadingooshoe 4. Follow-up in 1 week Electronic Signature(s) Signed: 03/02/2021 1:58:04 PM By: Kalman Shan DO Entered By: Kalman Shan on 03/02/2021 13:57:03 -------------------------------------------------------------------------------- HxROS Details Patient Name: Date of Service: [name] [name] 03/01/2021 1:45 PM Medical Record Number: [id number] Patient Account Number: [id number] Date of Birth/Sex: Treating RN: 1950-10-10 (70 y.o. [name] Primary Care Provider: [name] Other Clinician: Referring Provider: Treating Provider/Extender: [name] in Treatment: 2 Information Obtained From Patient Eyes Medical History:  Negative for: Cataracts; Glaucoma; Optic Neuritis Ear/Nose/Mouth/Throat Medical History: Negative for: Chronic sinus problems/congestion; Middle ear problems Hematologic/Lymphatic Medical History: Negative for: Anemia; Hemophilia; Human Immunodeficiency Virus; Lymphedema; Sickle Cell Disease Respiratory Medical History: Negative for: Aspiration;  Asthma; Chronic Obstructive Pulmonary Disease (COPD); Pneumothorax; Sleep Apnea; Tuberculosis Cardiovascular Medical History: Positive for: Myocardial Infarction - 01/10/2020 Negative for: Arrhythmia; Congestive Heart Failure; Coronary Artery Disease; Deep Vein Thrombosis; Hypertension; Hypotension; Peripheral Arterial Disease; Peripheral Venous Disease; Phlebitis; Vasculitis Gastrointestinal Medical History: Negative for: Cirrhosis ; Colitis; Crohns; Hepatitis A; Hepatitis B; Hepatitis C Endocrine Medical History: Positive for: Type II Diabetes Negative for: Type I Diabetes Time with diabetes: dx 2013 Treated with: Oral agents, Diet Blood sugar tested every day: Yes Tested : daily Genitourinary Medical History: Negative for: End Stage Renal Disease Immunological Medical History: Negative for: Lupus Erythematosus; Raynauds; Scleroderma Integumentary (Skin) Medical History: Negative for: History of Burn Musculoskeletal Medical History: Positive for: Osteoarthritis Negative for: Gout; Rheumatoid Arthritis; Osteomyelitis Past Medical History Notes: carpel tunnel 2021 right wrist Neurologic Medical History: Negative for: Dementia; Neuropathy; Quadriplegia; Paraplegia; Seizure Disorder Oncologic Medical History: Negative for: Received Chemotherapy; Received Radiation Past Medical History Notes: Skin [name] 10/21 Psychiatric Medical History: Negative for: Anorexia/bulimia; Confinement Anxiety Immunizations Pneumococcal Vaccine: Received Pneumococcal Vaccination: Yes Implantable Devices No devices added Hospitalization / Surgery History Type of Hospitalization/Surgery 01/09/2020 STEMI stent placement Family and Social History Cancer: Yes - Mother,Maternal Grandparents,Father; Diabetes: No; Heart Disease: No; Hereditary Spherocytosis: No; Hypertension: Yes - Mother; Kidney Disease: No; Lung Disease: Yes - Father; Seizures: No; Stroke: No; Thyroid Problems: No; Tuberculosis: No;  Former smoker - smokeless tobacoo; Marital Status - Single; Alcohol Use: Rarely - beer; Drug Use: No History; Caffeine Use: Daily - coffee; Financial Concerns: No; Food, Clothing or Shelter Needs: No; Support System Lacking: No; Transportation Concerns: No Electronic Signature(s) Signed: 03/02/2021 1:58:04 PM By: Kalman Shan DO Signed: 03/05/2021 5:08:20 PM By: Rhae Hammock RN Entered By: Kalman Shan on 03/02/2021 13:53:26 -------------------------------------------------------------------------------- SuperBill Details Patient Name: Date of Service: [name] [name]. 03/01/2021 Medical Record Number: [id number] Patient Account Number: [id number] Date of Birth/Sex: Treating RN: [birth year] (70 y.o. 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 I10 Essential (primary) hypertension Facility Procedures CPT4 Code: [id number] Description: 01027 - DEB SUBQ TISSUE 20 SQ CM/< ICD-10 Diagnosis Description L97.519 Non-pressure chronic ulcer of other part of right foot with unspecified severi Modifier: ty Quantity: 1 Physician Procedures : CPT4 Code Description Modifier [id number] - WC PHYS SUBQ TISS 20 SQ CM ICD-10 Diagnosis Description L97.519 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

## 2021-03-05 NOTE — Progress Notes (Signed)
Hallgren, Ronne L. ([id number]) Visit Report for 03/01/2021 Arrival Information Details Patient Name: Date of Service: FARRIS, GEIMAN 03/01/2021 1:45 PM Medical Record Number: [id number] Patient Account Number: [id number] Date of Birth/Sex: Treating RN: 03-27-1951 (70 y.o. [name], [name] Primary Care Brienne Liguori: [name] Other Clinician: Referring Lucio Litsey: Treating Severn Goddard/Extender: [name] in Treatment: 2 Visit Information History Since Last Visit Added or deleted any medications: No Patient Arrived: Ambulatory Any new allergies or adverse reactions: No Arrival Time: 14:36 Had a fall or experienced change in No Accompanied By: self activities of daily living that may affect Transfer Assistance: None risk of falls: Patient Identification Verified: Yes Signs or symptoms of abuse/neglect since last visito No Secondary Verification Process Completed: Yes Hospitalized since last visit: No Patient Requires Transmission-Based Precautions: No Implantable device outside of the clinic excluding No Patient Has Alerts: No cellular tissue based products placed in the center since last visit: Has Dressing in Place as Prescribed: Yes Pain Present Now: No Electronic Signature(s) Signed: 03/05/2021 3:49:49 PM By: Sandre Kitty Entered By: Sandre Kitty on 03/01/2021 14:38:55 -------------------------------------------------------------------------------- Lower Extremity Assessment Details Patient Name: Date of Service: CA CLARION, MOONEYHAN 03/01/2021 1:45 PM Medical Record Number: [id number] Patient Account Number: [id number] Date of Birth/Sex: Treating RN: [birth year] (70 y.o. [name] Primary Care Levin Dagostino: [name] Other Clinician: Referring Josepha Barbier: Treating Rabecca Birge/Extender: [name] in Treatment: 2 Edema Assessment Assessed: Shirlyn Goltz: No] Patrice Paradise: Yes] Edema: [Left: N] [Right: o] Calf Left:  Right: Point of Measurement: 32 cm From Medial Instep 42 cm Ankle Left: Right: Point of Measurement: 11 cm From Medial Instep 24 cm Vascular Assessment Pulses: Dorsalis Pedis Palpable: [Right:Yes] Electronic Signature(s) Signed: 03/01/2021 6:01:46 PM By: [name] Entered By: [name] on 03/01/2021 15:04:36 -------------------------------------------------------------------------------- Multi Wound Chart Details Patient Name: Date of Service: [name] 03/01/2021 1:45 PM Medical Record Number: [id number] Patient Account Number: [id number] Date of Birth/Sex: Treating RN: 10-15-1950 (70 y.o. [name], [name] Primary Care Shandy Vi: [name] Other Clinician: Referring Zafar Debrosse: Treating Donavan Kerlin/Extender: [name] in Treatment: 2 Vital Signs Height(in): 73 Pulse(bpm): 73 Weight(lbs): 250 Blood Pressure(mmHg): 150/94 Body Mass Index(BMI): 33 Temperature(F): 98.5 Respiratory Rate(breaths/min): 20 Photos: [N/A:N/A] Right T Great oe N/A N/A Wound Location: Blister N/A N/A Wounding Event: Diabetic Wound/Ulcer of the Lower N/A N/A Primary Etiology: Extremity Myocardial Infarction, Type II N/A N/A Comorbid History: Diabetes, Osteoarthritis 05/23/2020 N/A N/A Date Acquired: 2 N/A N/A Weeks of Treatment: Open N/A N/A Wound Status: 0.3x0.5x0.2 N/A N/A Measurements L x W x D (cm) 0.118 N/A N/A A (cm) : rea 0.024 N/A N/A Volume (cm) : 24.80% N/A N/A % Reduction in A rea: 74.50% N/A N/A % Reduction in Volume: Grade 2 N/A N/A Classification: Medium N/A N/A Exudate A mount: Serosanguineous N/A N/A Exudate Type: red, brown N/A N/A Exudate Color: Distinct, outline attached N/A N/A Wound Margin: Large (67-100%) N/A N/A Granulation A mount: Pink, Pale N/A N/A Granulation Quality: Small (1-33%) N/A N/A Necrotic A mount: Fat Layer (Subcutaneous Tissue): Yes N/A N/A Exposed Structures: Fascia:  No Tendon: No Muscle: No Joint: No Bone: No None N/A N/A Epithelialization: Debridement - Excisional N/A N/A Debridement: Pre-procedure Verification/Time Out 15:11 N/A N/A Taken: Lidocaine N/A N/A Pain Control: Callus, Subcutaneous, Slough N/A N/A Tissue Debrided: Skin/Subcutaneous Tissue N/A N/A Level: 0.15 N/A N/A Debridement A (sq cm): rea Curette N/A N/A Instrument: Minimum N/A N/A Bleeding: Pressure N/A N/A Hemostasis Achieved: 0 N/A N/A Procedural  Pain: 0 N/A N/A Post Procedural Pain: Procedure was tolerated well N/A N/A Debridement Treatment Response: 0.3x0.5x0.2 N/A N/A Post Debridement Measurements L x W x D (cm) 0.024 N/A N/A Post Debridement Volume: (cm) Calloused periwound N/A N/A Assessment Notes: Debridement N/A N/A Procedures Performed: Treatment Notes Electronic Signature(s) Signed: 03/02/2021 1:58:04 PM By: Kalman Shan DO Signed: 03/05/2021 5:08:20 PM By: Rhae Hammock RN Entered By: Kalman Shan on 03/02/2021 13:52:04 -------------------------------------------------------------------------------- Multi-Disciplinary Care Plan Details Patient Name: Date of Service: [name] 03/01/2021 1:45 PM Medical Record Number: [id number] Patient Account Number: [id number] Date of Birth/Sex: Treating RN: 03-26-1951 (70 y.o. [name], [name] Primary Care Indiana Gamero: [name] Other Clinician: Referring Averie Hornbaker: Treating Tamecca Artiga/Extender: [name] in Treatment: 2 Active Inactive Nutrition Nursing Diagnoses: Potential for alteratiion in Nutrition/Potential for imbalanced nutrition Goals: Patient/caregiver agrees to and verbalizes understanding of need to obtain nutritional consultation Date Initiated: 02/13/2021 Target Resolution Date: 03/17/2021 Goal Status: Active Interventions: Provide education on elevated blood sugars and impact on wound healing Provide education on  nutrition Treatment Activities: Obtain HgA1c : 02/13/2021 Patient referred to Primary Care Physician for further nutritional evaluation : 02/13/2021 Notes: Orientation to the Wound Care Program Nursing Diagnoses: Knowledge deficit related to the wound healing center program Goals: Patient/caregiver will verbalize understanding of the [facility] Date Initiated: 02/13/2021 Target Resolution Date: 03/17/2021 Goal Status: Active Interventions: Provide education on orientation to the wound center Notes: Pain, Acute or Chronic Nursing Diagnoses: Pain, acute or chronic: actual or potential Potential alteration in comfort, pain Goals: Patient will verbalize adequate pain control and receive pain control interventions during procedures as needed Date Initiated: 02/13/2021 Target Resolution Date: 03/17/2021 Goal Status: Active Interventions: Encourage patient to take pain medications as prescribed Provide education on pain management Reposition patient for comfort Treatment Activities: Administer pain control measures as ordered : 02/13/2021 Notes: Wound/Skin Impairment Nursing Diagnoses: Knowledge deficit related to ulceration/compromised skin integrity Goals: Patient/caregiver will verbalize understanding of skin care regimen Date Initiated: 02/13/2021 Target Resolution Date: 03/17/2021 Goal Status: Active Interventions: Assess patient/caregiver ability to perform ulcer/skin care regimen upon admission and as needed Assess ulceration(s) every visit Provide education on ulcer and skin care Treatment Activities: Skin care regimen initiated : 02/13/2021 Topical wound management initiated : 02/13/2021 Notes: Electronic Signature(s) Signed: 03/01/2021 6:01:49 PM By: Rhae Hammock RN Entered By: Rhae Hammock on 03/01/2021 15:10:36 -------------------------------------------------------------------------------- Pain Assessment Details Patient Name: Date of  Service: [name] 03/01/2021 1:45 PM Medical Record Number: [id number] Patient Account Number: [id number] Date of Birth/Sex: Treating RN: 1951/01/11 (70 y.o. [name] Primary Care Aerabella Galasso: [name] Other Clinician: Referring Deryck Hippler: Treating Doriann Zuch/Extender: [name] in Treatment: 2 Active Problems Location of Pain Severity and Description of Pain Patient Has Paino No Site Locations Pain Management and Medication Current Pain Management: Electronic Signature(s) Signed: 03/01/2021 6:01:49 PM By: Rhae Hammock RN Signed: 03/05/2021 3:49:49 PM By: Sandre Kitty Entered By: Sandre Kitty on 03/01/2021 14:39:22 -------------------------------------------------------------------------------- Patient/Caregiver Education Details Patient Name: Date of Service: [name] 7/14/2022andnbsp1:45 PM Medical Record Number: [id number] Patient Account Number: [id number] Date of Birth/Gender: Treating RN: [birth year] (70 y.o. [name] Primary Care Physician: [name] Other Clinician: Referring Physician: Treating Physician/Extender: [name] in Treatment: 2 Education Assessment Education Provided To: Patient Education Topics Provided Nutrition: Methods: Explain/Verbal Responses: State content correctly Electronic Signature(s) Signed: 03/01/2021 6:01:49 PM By: Rhae Hammock RN Entered By: Rhae Hammock on 03/01/2021 15:10:50 --------------------------------------------------------------------------------  Wound Assessment Details Patient Name: Date of Service: SRIHARI, SHELLHAMMER 03/01/2021 1:45 PM Medical Record Number: [id number] Patient Account Number: [id number] Date of Birth/Sex: Treating RN: [birth year] (69 y.o. [name], [name] Primary Care Allesha Aronoff: [name] Other Clinician: Referring Allea Kassner: Treating Zaylie Gisler/Extender: [name] in Treatment: 2 Wound Status Wound Number: 1 Primary Etiology: Diabetic Wound/Ulcer of the Lower Extremity Wound Location: Right T Great oe Wound Status: Open Wounding Event: Blister Comorbid History: Myocardial Infarction, Type II Diabetes, Osteoarthritis Date Acquired: 05/23/2020 Weeks Of Treatment: 2 Clustered Wound: No Photos Wound Measurements Length: (cm) 0.3 Width: (cm) 0.5 Depth: (cm) 0.2 Area: (cm) 0.118 Volume: (cm) 0.024 % Reduction in Area: 24.8% % Reduction in Volume: 74.5% Epithelialization: None Tunneling: No Undermining: No Wound Description Classification: Grade 2 Wound Margin: Distinct, outline attached Exudate Amount: Medium Exudate Type: Serosanguineous Exudate Color: red, brown Foul Odor After Cleansing: No Slough/Fibrino Yes Wound Bed Granulation Amount: Large (67-100%) Exposed Structure Granulation Quality: Pink, Pale Fascia Exposed: No Necrotic Amount: Small (1-33%) Fat Layer (Subcutaneous Tissue) Exposed: Yes Necrotic Quality: Adherent Slough Tendon Exposed: No Muscle Exposed: No Joint Exposed: No Bone Exposed: No Assessment Notes Calloused periwound Electronic Signature(s) Signed: 03/05/2021 3:49:49 PM By: Sandre Kitty Signed: 03/05/2021 5:08:20 PM By: Rhae Hammock RN Previous Signature: 03/01/2021 6:01:46 PM Version By: [name] Previous Signature: 03/01/2021 6:01:49 PM Version By: Rhae Hammock RN Entered By: Sandre Kitty on 03/02/2021 08:07:27 -------------------------------------------------------------------------------- Vitals Details Patient Name: Date of Service: [name] 03/01/2021 1:45 PM Medical Record Number: [id number] Patient Account Number: [id number] Date of Birth/Sex: Treating RN: 1951/04/23 (70 y.o. [name], [name] Primary Care Jowanda Heeg: [name] Other Clinician: Referring Azani Brogdon: Treating Andora Krull/Extender: [name] in Treatment: 2 Vital Signs Time Taken: 14:38 Temperature (F): 98.5 Height (in): 73 Pulse (bpm): 73 Weight (lbs): 250 Respiratory Rate (breaths/min): 20 Body Mass Index (BMI): 33 Blood Pressure (mmHg): 150/94 Reference Range: 80 - 120 mg / dl Electronic Signature(s) Signed: 03/05/2021 3:49:49 PM By: Sandre Kitty Entered By: Sandre Kitty on 03/01/2021 14:39:16

## 2021-03-08 ENCOUNTER — Encounter (HOSPITAL_BASED_OUTPATIENT_CLINIC_OR_DEPARTMENT_OTHER): Payer: HMO | Admitting: Internal Medicine

## 2021-03-08 ENCOUNTER — Other Ambulatory Visit: Payer: Self-pay

## 2021-03-08 DIAGNOSIS — L97519 Non-pressure chronic ulcer of other part of right foot with unspecified severity: Secondary | ICD-10-CM

## 2021-03-08 DIAGNOSIS — E11621 Type 2 diabetes mellitus with foot ulcer: Secondary | ICD-10-CM | POA: Diagnosis not present

## 2021-03-09 NOTE — Progress Notes (Addendum)
[name], Tierre L. ([id number]) Visit Report for 03/08/2021 Arrival Information Details Patient Name: Date of Service: [name], [name] 03/08/2021 3:00 PM Medical Record Number: [id number] Patient Account Number: [id number] Date of Birth/Sex: Treating RN: Mar 01, 1951 (70 y.o. [name], [name] Primary Care Lucina Betty: [name] Other Clinician: Referring Demosthenes Virnig: Treating Arrin Ishler/Extender: [name] in Treatment: 3 Visit Information History Since Last Visit Added or deleted any medications: No Patient Arrived: Ambulatory Any new allergies or adverse reactions: No Arrival Time: 16:30 Had a fall or experienced change in No Accompanied By: Self activities of daily living that may affect Transfer Assistance: None risk of falls: Patient Identification Verified: Yes Signs or symptoms of abuse/neglect since last visito No Secondary Verification Process Completed: Yes Hospitalized since last visit: No Patient Requires Transmission-Based Precautions: No Implantable device outside of the clinic excluding No Patient Has Alerts: No cellular tissue based products placed in the center since last visit: Has Dressing in Place as Prescribed: Yes Pain Present Now: No Electronic Signature(s) Signed: 03/09/2021 10:49:45 AM By: Sandre Kitty Entered By: Sandre Kitty on 03/08/2021 16:31:27 -------------------------------------------------------------------------------- Encounter Discharge Information Details Patient Name: Date of Service: [name] [name] Mohair L. 03/08/2021 3:00 PM Medical Record Number: [id number] Patient Account Number: [id number] Date of Birth/Sex: Treating RN: October 31, 1950 (70 y.o. [name] Primary Care Charvi Gammage: [name] Other Clinician: Referring Mirren Gest: Treating Jarrod Mcenery/Extender: [name] in Treatment: 3 Encounter Discharge Information Items Post Procedure Vitals Discharge Condition:  Stable Temperature (F): 98.1 Ambulatory Status: Ambulatory Pulse (bpm): 80 Discharge Destination: Home Respiratory Rate (breaths/min): 20 Transportation: Private Auto Blood Pressure (mmHg): 132/85 Schedule Follow-up Appointment: Yes Clinical Summary of Care: Provided on 03/08/2021 Form Type Recipient Paper Patient Patient Electronic Signature(s) Signed: 03/08/2021 5:17:15 PM By: [name] Entered By: [name] on 03/08/2021 17:17:15 -------------------------------------------------------------------------------- Lower Extremity Assessment Details Patient Name: Date of Service: [name], [name] 03/08/2021 3:00 PM Medical Record Number: [id number] Patient Account Number: [id number] Date of Birth/Sex: Treating RN: [birth year] (70 y.o. [name] Primary Care Anisa Leanos: [name] Other Clinician: Referring Myrl Bynum: Treating [name]/Extender: [name] in Treatment: 3 Edema Assessment Assessed: [Left: No] [Right: No] Edema: [Left: N] [Right: o] Calf Left: Right: Point of Measurement: 32 cm From Medial Instep 42 cm Ankle Left: Right: Point of Measurement: 11 cm From Medial Instep 24.2 cm Vascular Assessment Pulses: Dorsalis Pedis Palpable: [Right:Yes] Electronic Signature(s) Signed: 03/08/2021 6:07:52 PM By: Baruch Gouty RN, BSN Entered By: Baruch Gouty on 03/08/2021 16:43:50 -------------------------------------------------------------------------------- Multi Wound Chart Details Patient Name: Date of Service: [name] [name] L. 03/08/2021 3:00 PM Medical Record Number: [id number] Patient Account Number: [id number] Date of Birth/Sex: Treating RN: 05-22-1951 (70 y.o. [name], [name] Primary Care [name]: [name] Other Clinician: Referring [name]: Treating [name]/Extender: [name] in Treatment: 3 Vital Signs Height(in): 73 Pulse(bpm): 80 Weight(lbs): 250 Blood  Pressure(mmHg): 132/85 Body Mass Index(BMI): 33 Temperature(F): 98.1 Respiratory Rate(breaths/min): 20 Photos: [1:Right T Great oe] [N/A:N/A N/A] Wound Location: [1:Blister] [N/A:N/A] Wounding Event: [1:Diabetic Wound/Ulcer of the Lower] [N/A:N/A] Primary Etiology: [1:Extremity Myocardial Infarction, Type II] [N/A:N/A] Comorbid History: [1:Diabetes, Osteoarthritis 05/23/2020] [N/A:N/A] Date Acquired: [1:3] [N/A:N/A] Weeks of Treatment: [1:Open] [N/A:N/A] Wound Status: [1:0.2x0.2x0.3] [N/A:N/A] Measurements L x W x D (cm) [1:0.031] [N/A:N/A] A (cm) : rea [1:0.009] [N/A:N/A] Volume (cm) : [1:80.30%] [N/A:N/A] % Reduction in A [1:rea: 90.40%] [N/A:N/A] % Reduction in Volume: [1:12] Starting Position 1 (o'clock): [1:12] Ending Position 1 (o'clock): [1:0.6] Maximum Distance 1 (cm): [  1:Yes] [N/A:N/A] Undermining: [1:Grade 2] [N/A:N/A] Classification: [1:Small] [N/A:N/A] Exudate A mount: [1:Serosanguineous] [N/A:N/A] Exudate Type: [1:red, brown] [N/A:N/A] Exudate Color: [1:Well defined, not attached] [N/A:N/A] Wound Margin: [1:Large (67-100%)] [N/A:N/A] Granulation A mount: [1:Red, Friable] [N/A:N/A] Granulation Quality: [1:None Present (0%)] [N/A:N/A] Necrotic A mount: [1:Fat Layer (Subcutaneous Tissue): Yes N/A] Exposed Structures: [1:Fascia: No Tendon: No Muscle: No Joint: No Bone: No None] [N/A:N/A] Epithelialization: [1:Debridement - Excisional] [N/A:N/A] Debridement: Pre-procedure Verification/Time Out 16:49 [N/A:N/A] Taken: [1:Other] [N/A:N/A] Pain Control: [1:Callus, Subcutaneous] [N/A:N/A] Tissue Debrided: [1:Skin/Subcutaneous Tissue] [N/A:N/A] Level: [1:0.04] [N/A:N/A] Debridement A (sq cm): [1:rea Curette] [N/A:N/A] Instrument: [1:Minimum] [N/A:N/A] Bleeding: [1:Pressure] [N/A:N/A] Hemostasis A chieved: [1:Procedure was tolerated well] [N/A:N/A] Debridement Treatment Response: [1:0.2x0.2x0.3] [N/A:N/A] Post Debridement Measurements L x W x D (cm) [1:0.009]  [N/A:N/A] Post Debridement Volume: (cm) [1:calloused periwound] [N/A:N/A] Assessment Notes: [1:Debridement] [N/A:N/A] Treatment Notes Wound #1 (Toe Great) Wound Laterality: Right Cleanser Soap and Water Discharge Instruction: May shower and wash wound with dial antibacterial soap and water prior to dressing change. Peri-Wound Care Topical Primary Dressing KerraCel Ag Gelling Fiber Dressing, 2x2 in (silver alginate) Discharge Instruction: ***Lightly pack into wound bed.***Apply silver alginate to wound bed as instructed Secondary Dressing Woven Gauze Sponges 2x2 in Discharge Instruction: Apply over primary dressing as directed. Optifoam Non-Adhesive Dressing, 4x4 in Discharge Instruction: Foam donut Secured With Conforming Stretch Gauze Bandage, Sterile 2x75 (in/in) Discharge Instruction: Secure with stretch gauze as directed. 17M Medipore H Soft Cloth Surgical T 4 x 2 (in/yd) ape Discharge Instruction: Secure dressing with tape as directed. Compression Wrap Compression Stockings Add-Ons Electronic Signature(s) Signed: 03/12/2021 4:40:45 PM By: Kalman Shan DO Signed: 03/13/2021 5:55:16 PM By: Rhae Hammock RN Entered By: Kalman Shan on 03/12/2021 16:20:43 -------------------------------------------------------------------------------- Multi-Disciplinary Care Plan Details Patient Name: Date of Service: [name] [name] Mohair L. 03/08/2021 3:00 PM Medical Record Number: [id number] Patient Account Number: [id number] Date of Birth/Sex: Treating RN: 26-Dec-1950 (70 y.o. [name] Primary Care Chanc Kervin: [name] Other Clinician: Referring Amarion Portell: Treating Larsen Dungan/Extender: [name] in Treatment: 3 Active Inactive Nutrition Nursing Diagnoses: Potential for alteratiion in Nutrition/Potential for imbalanced nutrition Goals: Patient/caregiver agrees to and verbalizes understanding of need to obtain nutritional consultation Date  Initiated: 02/13/2021 Target Resolution Date: 03/17/2021 Goal Status: Active Interventions: Provide education on elevated blood sugars and impact on wound healing Provide education on nutrition Treatment Activities: Education provided on Nutrition : 03/01/2021 Obtain HgA1c : 02/13/2021 Patient referred to Primary Care Physician for further nutritional evaluation : 02/13/2021 Notes: Orientation to the Wound Care Program Nursing Diagnoses: Knowledge deficit related to the wound healing center program Goals: Patient/caregiver will verbalize understanding of the [place] Date Initiated: 02/13/2021 Target Resolution Date: 03/17/2021 Goal Status: Active Interventions: Provide education on orientation to the wound center Notes: Pain, Acute or Chronic Nursing Diagnoses: Pain, acute or chronic: actual or potential Potential alteration in comfort, pain Goals: Patient will verbalize adequate pain control and receive pain control interventions during procedures as needed Date Initiated: 02/13/2021 Target Resolution Date: 03/17/2021 Goal Status: Active Interventions: Encourage patient to take pain medications as prescribed Provide education on pain management Reposition patient for comfort Treatment Activities: Administer pain control measures as ordered : 02/13/2021 Notes: Wound/Skin Impairment Nursing Diagnoses: Knowledge deficit related to ulceration/compromised skin integrity Goals: Patient/caregiver will verbalize understanding of skin care regimen Date Initiated: 02/13/2021 Target Resolution Date: 03/17/2021 Goal Status: Active Interventions: Assess patient/caregiver ability to perform ulcer/skin care regimen upon admission and as needed Assess ulceration(s) every visit Provide education on ulcer and skin  care Treatment Activities: Skin care regimen initiated : 02/13/2021 Topical wound management initiated : 02/13/2021 Notes: Electronic Signature(s) Signed:  03/08/2021 6:43:27 PM By: [name] Entered By: [name] on 03/08/2021 16:55:05 -------------------------------------------------------------------------------- Pain Assessment Details Patient Name: Date of Service: [name] [name] Mohair L. 03/08/2021 3:00 PM Medical Record Number: [id number] Patient Account Number: [id number] Date of Birth/Sex: Treating RN: [birth year] (70 y.o. [name] Primary Care Jayliah Benett: [name] Other Clinician: Referring Mutasim Tuckey: Treating Doak Mah/Extender: [name] in Treatment: 3 Active Problems Location of Pain Severity and Description of Pain Patient Has Paino No Site Locations Pain Management and Medication Current Pain Management: Electronic Signature(s) Signed: 03/08/2021 7:48:44 PM By: Rhae Hammock RN Signed: 03/09/2021 10:49:45 AM By: Sandre Kitty Entered By: Sandre Kitty on 03/08/2021 16:32:36 -------------------------------------------------------------------------------- Patient/Caregiver Education Details Patient Name: Date of Service: [name] 7/21/2022andnbsp3:00 PM Medical Record Number: [id number] Patient Account Number: [id number] Date of Birth/Gender: Treating RN: December 09, 1950 (70 y.o. [name] Primary Care Physician: [name] Other Clinician: Referring Physician: Treating Physician/Extender: [name] in Treatment: 3 Education Assessment Education Provided To: Patient Education Topics Provided Elevated Blood Sugar/ Impact on Healing: Methods: Explain/Verbal, Printed Responses: State content correctly Offloading: Methods: Explain/Verbal, Printed Responses: State content correctly Wound/Skin Impairment: Methods: Explain/Verbal, Printed Responses: State content correctly Electronic Signature(s) Signed: 03/08/2021 6:43:27 PM By: [name] Entered By: [name] on 03/08/2021  16:59:25 -------------------------------------------------------------------------------- Wound Assessment Details Patient Name: Date of Service: [name] [name] Mohair L. 03/08/2021 3:00 PM Medical Record Number: [id number] Patient Account Number: [id number] Date of Birth/Sex: Treating RN: 1951-02-23 (70 y.o. [name] Primary Care Aliceson Dolbow: [name] Other Clinician: Referring Arayah Krouse: Treating Tiffany Calmes/Extender: [name] in Treatment: 3 Wound Status Wound Number: 1 Primary Etiology: Diabetic Wound/Ulcer of the Lower Extremity Wound Location: Right T Great oe Wound Status: Open Wounding Event: Blister Comorbid History: Myocardial Infarction, Type II Diabetes, Osteoarthritis Date Acquired: 05/23/2020 Weeks Of Treatment: 3 Clustered Wound: No Photos Wound Measurements Length: (cm) 0.2 Width: (cm) 0.2 Depth: (cm) 0.3 Area: (cm) 0.031 Volume: (cm) 0.009 % Reduction in Area: 80.3% % Reduction in Volume: 90.4% Epithelialization: None Tunneling: No Undermining: Yes Starting Position (o'clock): 12 Ending Position (o'clock): 12 Maximum Distance: (cm) 0.6 Wound Description Classification: Grade 2 Wound Margin: Well defined, not attached Exudate Amount: Small Exudate Type: Serosanguineous Exudate Color: red, brown Foul Odor After Cleansing: No Slough/Fibrino Yes Wound Bed Granulation Amount: Large (67-100%) Exposed Structure Granulation Quality: Red, Friable Fascia Exposed: No Necrotic Amount: None Present (0%) Fat Layer (Subcutaneous Tissue) Exposed: Yes Tendon Exposed: No Muscle Exposed: No Joint Exposed: No Bone Exposed: No Assessment Notes calloused periwound Treatment Notes Wound #1 (Toe Great) Wound Laterality: Right Cleanser Soap and Water Discharge Instruction: May shower and wash wound with dial antibacterial soap and water prior to dressing change. Peri-Wound Care Topical Primary Dressing KerraCel Ag Gelling  Fiber Dressing, 2x2 in (silver alginate) Discharge Instruction: ***Lightly pack into wound bed.***Apply silver alginate to wound bed as instructed Secondary Dressing Woven Gauze Sponges 2x2 in Discharge Instruction: Apply over primary dressing as directed. Optifoam Non-Adhesive Dressing, 4x4 in Discharge Instruction: Foam donut Secured With Conforming Stretch Gauze Bandage, Sterile 2x75 (in/in) Discharge Instruction: Secure with stretch gauze as directed. 18M Medipore H Soft Cloth Surgical T 4 x 2 (in/yd) ape Discharge Instruction: Secure dressing with tape as directed. Compression Wrap Compression Stockings Add-Ons Electronic Signature(s) Signed: 03/08/2021 6:07:52 PM By: Baruch Gouty RN, BSN Entered By: Baruch Gouty on  03/08/2021 16:44:58 -------------------------------------------------------------------------------- Vitals Details Patient Name: Date of Service: [name], [name] 03/08/2021 3:00 PM Medical Record Number: [id number] Patient Account Number: [id number] Date of Birth/Sex: Treating RN: 10/05/1950 (70 y.o. [name], [name] Primary Care Adelaido Nicklaus: [name] Other Clinician: Referring Yulieth Carrender: Treating Georgene Kopper/Extender: [name] in Treatment: 3 Vital Signs Time Taken: 16:31 Temperature (F): 98.1 Height (in): 73 Pulse (bpm): 80 Weight (lbs): 250 Respiratory Rate (breaths/min): 20 Body Mass Index (BMI): 33 Blood Pressure (mmHg): 132/85 Reference Range: 80 - 120 mg / dl Electronic Signature(s) Signed: 03/09/2021 10:49:45 AM By: Sandre Kitty Entered By: Sandre Kitty on 03/08/2021 16:32:29

## 2021-03-13 NOTE — Progress Notes (Signed)
Green, [name] L. ([id number]) Visit Report for 03/08/2021 Chief Complaint Document Details Patient Name: Date of Service: [name], [name] 03/08/2021 3:00 PM Medical Record Number: [id number] Patient Account Number: [id number] Date of Birth/Sex: Treating RN: [name] (70 y.o. [name] Primary Care Provider: Caren Green Other Clinician: Referring Provider: Treating Provider/Extender: [name] in Treatment: 3 Information Obtained from: Patient Chief Complaint Right great toe wound Electronic Signature(s) Signed: 03/12/2021 4:40:45 PM By: [name] Shan DO Entered By: [name] on 03/12/2021 16:21:00 -------------------------------------------------------------------------------- Debridement Details Patient Name: Date of Service: [name] [name] Mohair L. 03/08/2021 3:00 PM Medical Record Number: [id number] Patient Account Number: [id number] Date of Birth/Sex: Treating RN: [name] [birth year], Green (70 y.o. [name] Primary Care Provider: Caren Green Other Clinician: Referring Provider: Treating Provider/Extender: [name] in Treatment: 3 Debridement Performed for Assessment: Wound #1 Right T Great oe Performed By: Physician [name] Shan, DO Debridement Type: Debridement Severity of Tissue Pre Debridement: Fat layer exposed Level of Consciousness (Pre-procedure): Awake and Alert Pre-procedure Verification/Time Out Yes - 16:49 Taken: Start Time: 16:50 Pain Control: Other : Benzocaine T Area Debrided (L x W): otal 0.2 (cm) x 0.2 (cm) = 0.04 (cm) Tissue and other material debrided: Non-Viable, Callus, Subcutaneous Level: Skin/Subcutaneous Tissue Debridement Description: Excisional Instrument: Curette Bleeding: Minimum Hemostasis Achieved: Pressure End Time: 16:57 Response to Treatment: Procedure was tolerated well Level of Consciousness (Post- Awake and Alert procedure): Post Debridement  Measurements of Total Wound Length: (cm) 0.2 Width: (cm) 0.2 Depth: (cm) 0.3 Volume: (cm) 0.009 Character of Wound/Ulcer Post Debridement: Stable Severity of Tissue Post Debridement: Fat layer exposed Post Procedure Diagnosis Same as Pre-procedure Electronic Signature(s) Signed: 03/08/2021 6:43:27 PM By: [name] Signed: 03/12/2021 4:40:45 PM By: [name] Shan DO Entered By: [name] on 03/08/2021 16:58:12 -------------------------------------------------------------------------------- HPI Details Patient Name: Date of Service: [name] [name] Mohair L. 03/08/2021 3:00 PM Medical Record Number: [id number] Patient Account Number: [id number] Date of Birth/Sex: Treating RN: [name] (70 y.o. [name], [name] Primary Care Provider: Caren Green Other Clinician: Referring Provider: Treating Provider/Extender: [name] in Treatment: 3 History of Present Illness HPI Description: Admission 6/28 [name] is a 70 year old male with a past medical history of uncontrolled insulin-dependent type 2 diabetes that presents to the wound care center for a 16-monthhistory of nonhealing wound to the right great toe. He states he has been following with podiatry for this issue. He is currently using antibiotic ointment to the area. He is wearing crocs today. He denies Increased warmth, erythema or purulent drainage from the wound. 7/8; patient presents for 1 week follow-up. He had his x-ray of his right foot done today. He has an offloading shoe we gave him at last clinic visit that has cracked. Overall he is doing well. he has no issues with dressing changes. He denies signs of infection. 7/15; patient presents for 1 week follow-up. He continues to use his offloading shoe daily. He reports improvement to the wound size. He has no issues with dressing changes and denies signs of infection. 7/25; patient presents for 1 week follow-up. He has no issues or  complaints today. He has been using silver alginate and his offloading shoe daily. He denies signs of infection. Electronic Signature(s) Signed: 03/12/2021 4:40:45 PM By: HKalman ShanDO Entered By: HKalman Shanon 03/12/2021 16:24:04 -------------------------------------------------------------------------------- Physical Exam Details Patient Name: Date of Service: [name] SDan Green Sire L. 03/08/2021 3:00 PM Medical Record Number: [id number] Account Number:  [id number] Date of Birth/Sex: Treating RN: [name] (70 y.o. [name] Primary Care Provider: Caren Green Other Clinician: Referring Provider: Treating Provider/Extender: [name] in Treatment: 3 Constitutional respirations regular, non-labored and within target range for patient.Marland Kitchen Psychiatric pleasant and cooperative. Notes Open wound to the right great toe with callus. Granulation tissue present with some nonviable tissue. No obvious signs of infection. Electronic Signature(s) Signed: 03/12/2021 4:40:45 PM By: [name] Shan DO Signed: 03/12/2021 4:40:45 PM By: [name] Shan DO Entered By: [name] on 03/12/2021 16:24:54 -------------------------------------------------------------------------------- Physician Orders Details Patient Name: Date of Service: [name] [name] Mohair L. 03/08/2021 3:00 PM Medical Record Number: [id number] Patient Account Number: [id number] Date of Birth/Sex: Treating RN: [name] (70 y.o. 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 - Dr. Heber Green Return A Edema Control - Lymphedema / SCD / Other Elevate legs to the level of the heart or above for 30 minutes daily and/or when sitting, a frequency of: - 3-4 times a day throughout the day. Avoid standing for long periods of time. Off-Loading Wedge shoe to: - right foot. Patient to wear while walking and standing. May wear regular shoe while driving. Wound Treatment Wound #1 - T Great oe Wound Laterality: Right Cleanser: Soap and Water 1 x Per Day/30 Days Discharge Instructions: May shower and wash wound with dial antibacterial soap and water prior to dressing change. Prim Dressing: KerraCel Ag Gelling Fiber Dressing, 2x2 in (silver alginate) (Generic) 1 x Per Day/30 Days ary Discharge Instructions: ***Lightly pack into wound bed.***Apply silver alginate to wound bed as instructed Secondary Dressing: Woven Gauze Sponges 2x2 in (Generic) 1 x Per Day/30 Days Discharge Instructions: Apply over primary dressing as directed. Secondary Dressing: Optifoam Non-Adhesive Dressing, 4x4 in (Generic) 1 x Per Day/30 Days Discharge Instructions: Foam donut Secured With: Conforming Stretch Gauze Bandage, Sterile 2x75 (in/in) (Generic) 1 x Per Day/30 Days Discharge Instructions: Secure with stretch gauze as directed. Secured With: 22M Medipore H Soft Cloth Surgical T 4 x 2 (in/yd) (Generic) 1 x Per Day/30 Days ape Discharge Instructions: Secure dressing with tape as directed. Electronic Signature(s) Signed: 03/12/2021 4:40:45 PM By: [name] Shan DO Previous Signature: 03/08/2021 6:43:27 PM Version By: [name] Entered By: [name] on 03/12/2021 16:25:43 -------------------------------------------------------------------------------- Problem List Details Patient Name: Date of Service: [name] [name] Mohair L. 03/08/2021 3:00 PM Medical Record Number: [id number] Patient Account Number: [id number] Date of Birth/Sex: Treating RN: Green[birth year] (70  y.o. 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 03/08/2021 3:00 PM Medical Record Number: [id number] Patient Account Number: [id number] Date of Birth/Sex: Treating RN: [birth year], Green (70 y.o. [name], [name] Primary  Care Provider: Caren Green Other Clinician: Referring Provider: Treating Provider/Extender: [name] in Treatment: 3 Subjective Chief Complaint Information obtained from Patient Right great toe wound History of Present Illness (HPI) Admission 6/28 [name] is a 70 year old male with a past medical history of uncontrolled insulin-dependent type 2 diabetes that presents to the wound care center for a 62-monthhistory of nonhealing wound to the right great toe. He states he has been following with podiatry for this issue. He is currently using antibiotic ointment to the area. He is wearing crocs today. He denies Increased warmth, erythema or purulent drainage from the wound. 7/8; patient presents for 1 week follow-up. He had his x-ray of his right foot done today. He has an offloading shoe we gave him at last clinic visit that has cracked. Overall he is doing well. he has no issues with  dressing changes. He denies signs of infection. 7/15; patient presents for 1 week follow-up. He continues to use his offloading shoe daily. He reports improvement to the wound size. He has no issues with dressing changes and denies signs of infection. 7/25; patient presents for 1 week follow-up. He has no issues or complaints today. He has been using silver alginate and his offloading shoe daily. He denies signs of infection. Patient History Information obtained from Patient. Family History Cancer - Mother,Maternal Grandparents,Father, Hypertension - Mother, Lung Disease - Father, No family history of Diabetes, Heart Disease, Hereditary Spherocytosis, Kidney Disease, Seizures, Stroke, Thyroid Problems, Tuberculosis. Social History Former smoker - smokeless tobacoo, Marital Status - Single, Alcohol Use - Rarely - beer, Drug Use - No History, Caffeine Use - Daily - coffee. Medical History Eyes Denies history of Cataracts, Glaucoma, Optic Neuritis Ear/Nose/Mouth/Throat Denies history of Chronic sinus problems/congestion, Middle ear problems Hematologic/Lymphatic Denies history of Anemia, Hemophilia, Human Immunodeficiency Virus, Lymphedema, Sickle Cell Disease Respiratory Denies history of Aspiration, Asthma, Chronic Obstructive Pulmonary Disease (COPD), Pneumothorax, Sleep Apnea, Tuberculosis Cardiovascular Patient has history of Myocardial Infarction - 01/10/2020 Denies history of Arrhythmia, Congestive Heart Failure, Coronary Artery Disease, Deep Vein Thrombosis, Hypertension, Hypotension, Peripheral Arterial Disease, Peripheral Venous Disease, Phlebitis, Vasculitis Gastrointestinal Denies history of Cirrhosis , Colitis, Crohnoos, Hepatitis A, Hepatitis B, Hepatitis C Endocrine Patient has history of Type II Diabetes Denies history of Type I Diabetes Genitourinary Denies history of End Stage Renal Disease Immunological Denies history of Lupus Erythematosus, Raynaudoos,  Scleroderma Integumentary (Skin) Denies history of History of Burn Musculoskeletal Patient has history of Osteoarthritis Denies history of Gout, Rheumatoid Arthritis, Osteomyelitis Neurologic Denies history of Dementia, Neuropathy, Quadriplegia, Paraplegia, Seizure Disorder Oncologic Denies history of Received Chemotherapy, Received Radiation Psychiatric Denies history of Anorexia/bulimia, Confinement Anxiety Hospitalization/Surgery History - 01/09/2020 STEMI stent placement. Medical A Surgical History Notes nd Musculoskeletal carpel tunnel 2021 right wrist Oncologic Skin [name] 10/21 Objective Constitutional respirations regular, non-labored and within target range for patient.. Vitals Time Taken: 4:31 PM, Height: 73 in, Weight: 250 lbs, BMI: 33, Temperature: 98.1 F, Pulse: 80 bpm, Respiratory Rate: 20 breaths/min, Blood Pressure: 132/85 mmHg. Psychiatric pleasant and cooperative. General Notes: Open wound to the right great toe with callus. Granulation tissue present with some nonviable tissue. No obvious signs of infection. Integumentary (Hair, Skin) Wound #1 status is Open. Original cause of wound was Blister. The date acquired was: 05/23/2020. The wound has been in treatment 3 weeks. The wound is located on the Right T Great. The wound measures 0.2cm length x 0.2cm width x 0.3cm depth; 0.031cm^2 area and 0.009cm^3 volume. There is Fat  Layer oe (Subcutaneous Tissue) exposed. There is no tunneling noted, however, there is undermining starting at 12:00 and ending at 12:00 with a maximum distance of 0.6cm. There is a small amount of serosanguineous drainage noted. The wound margin is well defined and not attached to the wound base. There is large (67- 100%) red, friable granulation within the wound bed. There is no necrotic tissue within the wound bed. General Notes: calloused periwound Assessment Active Problems ICD-10 Type 2 diabetes mellitus with foot ulcer Non-pressure chronic  ulcer of other part of right foot with unspecified severity Essential (primary) hypertension Patient's wound is stable. I debrided nonviable tissue. I recommended continuing silver alginate and front offloading shoe. No signs of infection on exam. Follow-up in 1 week. Procedures Wound #1 Pre-procedure diagnosis of Wound #1 is a Diabetic Wound/Ulcer of the Lower Extremity located on the Right T Great .Severity of Tissue Pre Debridement is: oe Fat layer exposed. There was a Excisional Skin/Subcutaneous Tissue Debridement with a total area of 0.04 sq cm performed by [name] Shan, DO. With the following instrument(s): Curette to remove Non-Viable tissue/material. Material removed includes Callus and Subcutaneous Tissue and after achieving pain control using Other (Benzocaine). No specimens were taken. A time out was conducted at 16:49, prior to the start of the procedure. A Minimum amount of bleeding was controlled with Pressure. The procedure was tolerated well. Post Debridement Measurements: 0.2cm length x 0.2cm width x 0.3cm depth; 0.009cm^3 volume. Character of Wound/Ulcer Post Debridement is stable. Severity of Tissue Post Debridement is: Fat layer exposed. Post procedure Diagnosis Wound #1: Same as Pre-Procedure Plan Follow-up Appointments: Return Appointment in 1 week. - Dr. Heber [place] Edema Control - Lymphedema / SCD / Other: Elevate legs to the level of the heart or above for 30 minutes daily and/or when sitting, a frequency of: - 3-4 times a day throughout the day. Avoid standing for long periods of time. Off-Loading: Wedge shoe to: - right foot. Patient to wear while walking and standing. May wear regular shoe while driving. WOUND #1: - T Great Wound Laterality: Right oe Cleanser: Soap and Water 1 x Per Day/30 Days Discharge Instructions: May shower and wash wound with dial antibacterial soap and water prior to dressing change. Prim Dressing: KerraCel Ag Gelling Fiber Dressing, 2x2  in (silver alginate) (Generic) 1 x Per Day/30 Days ary Discharge Instructions: ***Lightly pack into wound bed.***Apply silver alginate to wound bed as instructed Secondary Dressing: Woven Gauze Sponges 2x2 in (Generic) 1 x Per Day/30 Days Discharge Instructions: Apply over primary dressing as directed. Secondary Dressing: Optifoam Non-Adhesive Dressing, 4x4 in (Generic) 1 x Per Day/30 Days Discharge Instructions: Foam donut Secured With: Conforming Stretch Gauze Bandage, Sterile 2x75 (in/in) (Generic) 1 x Per Day/30 Days Discharge Instructions: Secure with stretch gauze as directed. Secured With: 65M Medipore H Soft Cloth Surgical T 4 x 2 (in/yd) (Generic) 1 x Per Day/30 Days ape Discharge Instructions: Secure dressing with tape as directed. 1. In office sharp debridement 2. Silver alginate 3. Aggressive offloadingoofront offloading shoe Electronic Signature(s) Signed: 03/12/2021 4:40:45 PM By: [name] Shan DO Entered By: [name] on 03/12/2021 16:39:00 -------------------------------------------------------------------------------- HxROS Details Patient Name: Date of Service: [name] [name], [name] L. 03/08/2021 3:00 PM Medical Record Number: [id number] Patient Account Number: [id number] Date of Birth/Sex: Treating RN: [birth year]Green (70 y.o. [name] Primary Care Provider: Caren Green Other Clinician: Referring Provider: Treating Provider/Extender: [name] in Treatment: 3 Information Obtained From Patient Eyes Medical History: Negative for: Cataracts; Glaucoma; Optic  Neuritis Ear/Nose/Mouth/Throat Medical History: Negative for: Chronic sinus problems/congestion; Middle ear problems Hematologic/Lymphatic Medical History: Negative for: Anemia; Hemophilia; Human Immunodeficiency Virus; Lymphedema; Sickle Cell Disease Respiratory Medical History: Negative for: Aspiration; Asthma; Chronic Obstructive Pulmonary Disease (COPD);  Pneumothorax; Sleep Apnea; Tuberculosis Cardiovascular Medical History: Positive for: Myocardial Infarction - 01/10/2020 Negative for: Arrhythmia; Congestive Heart Failure; Coronary Artery Disease; Deep Vein Thrombosis; Hypertension; Hypotension; Peripheral Arterial Disease; Peripheral Venous Disease; Phlebitis; Vasculitis Gastrointestinal Medical History: Negative for: Cirrhosis ; Colitis; Crohns; Hepatitis A; Hepatitis B; Hepatitis C Endocrine Medical History: Positive for: Type II Diabetes Negative for: Type I Diabetes Time with diabetes: dx 2013 Treated with: Oral agents, Diet Blood sugar tested every day: Yes Tested : daily Genitourinary Medical History: Negative for: End Stage Renal Disease Immunological Medical History: Negative for: Lupus Erythematosus; Raynauds; Scleroderma Integumentary (Skin) Medical History: Negative for: History of Burn Musculoskeletal Medical History: Positive for: Osteoarthritis Negative for: Gout; Rheumatoid Arthritis; Osteomyelitis Past Medical History Notes: carpel tunnel 2021 right wrist Neurologic Medical History: Negative for: Dementia; Neuropathy; Quadriplegia; Paraplegia; Seizure Disorder Oncologic Medical History: Negative for: Received Chemotherapy; Received Radiation Past Medical History Notes: Skin [name] 10/21 Psychiatric Medical History: Negative for: Anorexia/bulimia; Confinement Anxiety Immunizations Pneumococcal Vaccine: Received Pneumococcal Vaccination: Yes Received Pneumococcal Vaccination On or After 60th Birthday: No Implantable Devices No devices added Hospitalization / Surgery History Type of Hospitalization/Surgery 01/09/2020 STEMI stent placement Family and Social History Cancer: Yes - Mother,Maternal Grandparents,Father; Diabetes: No; Heart Disease: No; Hereditary Spherocytosis: No; Hypertension: Yes - Mother; Kidney Disease: No; Lung Disease: Yes - Father; Seizures: No; Stroke: No; Thyroid Problems: No;  Tuberculosis: No; Former smoker - smokeless tobacoo; Marital Status - Single; Alcohol Use: Rarely - beer; Drug Use: No History; Caffeine Use: Daily - coffee; Financial Concerns: No; Food, Clothing or Shelter Needs: No; Support System Lacking: No; Transportation Concerns: No Electronic Signature(s) Signed: 03/12/2021 4:40:45 PM By: [name] Shan DO Signed: 03/13/2021 5:55:16 PM By: Rhae Hammock RN Entered By: [name] on 03/12/2021 16:24:37 -------------------------------------------------------------------------------- SuperBill Details Patient Name: Date of Service: [name] [name] 03/08/2021 Medical Record Number: [id number] Patient Account Number: [id number] Date of Birth/Sex: Treating RN: [birth year]-Green (70 y.o. 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 I10 Essential (primary) hypertension Facility Procedures CPT4 Code: [id number] Description: [id number] - DEB SUBQ TISSUE 20 SQ CM/< ICD-10 Diagnosis Description L97.519 Non-pressure chronic ulcer of other part of right foot with unspecified severi Modifier: ty Quantity: 1 Physician Procedures : CPT4 Code Description Modifier [id number] - WC PHYS SUBQ TISS 20 SQ CM ICD-10 Diagnosis Description L97.519 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

## 2021-03-19 ENCOUNTER — Other Ambulatory Visit: Payer: Self-pay

## 2021-03-19 ENCOUNTER — Encounter (HOSPITAL_BASED_OUTPATIENT_CLINIC_OR_DEPARTMENT_OTHER): Payer: HMO | Attending: Internal Medicine | Admitting: Internal Medicine

## 2021-03-19 DIAGNOSIS — Z87891 Personal history of nicotine dependence: Secondary | ICD-10-CM | POA: Insufficient documentation

## 2021-03-19 DIAGNOSIS — E11621 Type 2 diabetes mellitus with foot ulcer: Secondary | ICD-10-CM | POA: Insufficient documentation

## 2021-03-19 DIAGNOSIS — I10 Essential (primary) hypertension: Secondary | ICD-10-CM | POA: Diagnosis not present

## 2021-03-19 DIAGNOSIS — Z794 Long term (current) use of insulin: Secondary | ICD-10-CM | POA: Diagnosis not present

## 2021-03-19 DIAGNOSIS — L84 Corns and callosities: Secondary | ICD-10-CM | POA: Insufficient documentation

## 2021-03-19 DIAGNOSIS — L97519 Non-pressure chronic ulcer of other part of right foot with unspecified severity: Secondary | ICD-10-CM | POA: Insufficient documentation

## 2021-03-19 NOTE — Progress Notes (Signed)
Green, [name] L. ([id number]) Visit Report for 03/19/2021 Chief Complaint Document Details Patient Name: Date of Service: [name], [name] 03/19/2021 3:00 PM Medical Record Number: [id number] Patient Account Number: [id number] Date of Birth/Sex: Treating RN: 14-Dec-1950 (70 y.o. [name] Primary Care Provider: [name] Other Clinician: Referring Provider: Treating Provider/Extender: [name] in Treatment: 4 Information Obtained from: Patient Chief Complaint Right great toe wound Electronic Signature(s) Signed: 03/19/2021 5:22:48 PM By: Kalman Shan DO Entered By: Kalman Shan on 03/19/2021 17:18:30 -------------------------------------------------------------------------------- Debridement Details Patient Name: Date of Service: [name] [name] 03/19/2021 3:00 PM Medical Record Number: [id number] Patient Account Number: [id number] Date of Birth/Sex: Treating RN: Dec 17, 1950 (70 y.o. [name] Primary Care Provider: [name] Other Clinician: Referring Provider: Treating Provider/Extender: [name] in Treatment: 4 Debridement Performed for Assessment: Wound #1 Right T Great oe Performed By: Physician Kalman Shan, DO Debridement Type: Debridement Severity of Tissue Pre Debridement: Fat layer exposed Level of Consciousness (Pre-procedure): Awake and Alert Pre-procedure Verification/Time Out Yes - 16:15 Taken: Start Time: 16:15 Pain Control: Lidocaine 4% T opical Solution T Area Debrided (L x W): otal 1.5 (cm) x 1.5 (cm) = 2.25 (cm) Tissue and other material debrided: Viable, Non-Viable, Callus, Slough, Subcutaneous, Skin: Epidermis, Slough Level: Skin/Subcutaneous Tissue Debridement Description: Excisional Instrument: Curette Bleeding: Minimum Hemostasis Achieved: Pressure End Time: 16:20 Procedural Pain: 0 Post Procedural Pain: 0 Response to Treatment: Procedure was tolerated  well Level of Consciousness (Post- Awake and Alert procedure): Post Debridement Measurements of Total Wound Length: (cm) 0.4 Width: (cm) 0.4 Depth: (cm) 0.3 Volume: (cm) 0.038 Character of Wound/Ulcer Post Debridement: Improved Severity of Tissue Post Debridement: Fat layer exposed Post Procedure Diagnosis Same as Pre-procedure Electronic Signature(s) Signed: 03/19/2021 5:22:48 PM By: Kalman Shan DO Signed: 03/19/2021 6:11:21 PM By: Baruch Gouty RN, BSN Entered By: Baruch Gouty on 03/19/2021 16:18:54 -------------------------------------------------------------------------------- HPI Details Patient Name: Date of Service: [name], [name] L. 03/19/2021 3:00 PM Medical Record Number: [id number] Patient Account Number: [id number] Date of Birth/Sex: Treating RN: 1951-03-16 (70 y.o. [name] Primary Care Provider: [name] Other Clinician: Referring Provider: Treating Provider/Extender: [name] in Treatment: 4 History of Present Illness HPI Description: Admission 6/28 [name] is a 70 year old male with a past medical history of uncontrolled insulin-dependent type 2 diabetes that presents to the wound care center for a 53-monthhistory of nonhealing wound to the right great toe. He states he has been following with podiatry for this issue. He is currently using antibiotic ointment to the area. He is wearing crocs today. He denies Increased warmth, erythema or purulent drainage from the wound. 7/8; patient presents for 1 week follow-up. He had his x-ray of his right foot done today. He has an offloading shoe we gave him at last clinic visit that has cracked. Overall he is doing well. he has no issues with dressing changes. He denies signs of infection. 7/15; patient presents for 1 week follow-up. He continues to use his offloading shoe daily. He reports improvement to the wound size. He has no issues with dressing changes and  denies signs of infection. 7/25; patient presents for 1 week follow-up. He has no issues or complaints today. He has been using silver alginate and his offloading shoe daily. He denies signs of infection. 8/1; patient presents for 1 week follow-up. He has no issues or complaints today. He continues to use silver alginate and his offloading shoe. He  denies signs of infection. Electronic Signature(s) Signed: 03/19/2021 5:22:48 PM By: Kalman Shan DO Entered By: Kalman Shan on 03/19/2021 17:18:53 -------------------------------------------------------------------------------- Physical Exam Details Patient Name: Date of Service: [name], [name] L. 03/19/2021 3:00 PM Medical Record Number: [id number] Patient Account Number: [id number] Date of Birth/Sex: Treating RN: [birth year] (70 y.o. [name] Primary Care Provider: [name] Other Clinician: Referring Provider: Treating Provider/Extender: [name] in Treatment: 4 Constitutional respirations regular, non-labored and within target range for patient.. Cardiovascular 2+ dorsalis pedis/posterior tibialis pulses. Psychiatric pleasant and cooperative. Notes Open wound to the right great toe with Circumferential callus. Granulation tissue present with some nonviable tissue. No obvious signs of infection. Electronic Signature(s) Signed: 03/19/2021 5:22:48 PM By: Kalman Shan DO Entered By: Kalman Shan on 03/19/2021 17:19:48 -------------------------------------------------------------------------------- Physician Orders Details Patient Name: Date of Service: [name] [name] 03/19/2021 3:00 PM Medical Record Number: [id number] Patient Account Number: [id number] Date of Birth/Sex: Treating RN: March 02, 1951 (70 y.o. [name] Primary Care Provider: [name] Other Clinician: Referring Provider: Treating Provider/Extender: [name] in Treatment:  4 Verbal / Phone Orders: No Diagnosis Coding ICD-10 Coding Code Description E11.621 Type 2 diabetes mellitus with foot ulcer L97.519 Non-pressure chronic ulcer of other part of right foot with unspecified severity I10 Essential (primary) hypertension Follow-up Appointments ppointment in 2 weeks. - with Dr. Heber [place] Return A Edema Control - Lymphedema / SCD / Other Elevate legs to the level of the heart or above for 30 minutes daily and/or when sitting, a frequency of: - 3-4 times a day throughout the day. Avoid standing for long periods of time. Off-Loading Wedge shoe to: - right foot. Patient to wear while walking and standing. May wear regular shoe while driving. Wound Treatment Wound #1 - T Great oe Wound Laterality: Right Cleanser: Soap and Water 1 x Per Day/30 Days Discharge Instructions: May shower and wash wound with dial antibacterial soap and water prior to dressing change. Prim Dressing: KerraCel Ag Gelling Fiber Dressing, 2x2 in (silver alginate) (Generic) 1 x Per Day/30 Days ary Discharge Instructions: ***Lightly pack into wound bed.***Apply silver alginate to wound bed as instructed Secondary Dressing: Woven Gauze Sponges 2x2 in (Generic) 1 x Per Day/30 Days Discharge Instructions: Apply over primary dressing as directed. Secondary Dressing: Optifoam Non-Adhesive Dressing, 4x4 in (Generic) 1 x Per Day/30 Days Discharge Instructions: Foam donut Secured With: Conforming Stretch Gauze Bandage, Sterile 2x75 (in/in) (Generic) 1 x Per Day/30 Days Discharge Instructions: Secure with stretch gauze as directed. Secured With: 59M Medipore H Soft Cloth Surgical T 4 x 2 (in/yd) (Generic) 1 x Per Day/30 Days ape Discharge Instructions: Secure dressing with tape as directed. Electronic Signature(s) Signed: 03/19/2021 5:22:48 PM By: Kalman Shan DO Entered By: Kalman Shan on 03/19/2021  17:20:00 -------------------------------------------------------------------------------- Problem List Details Patient Name: Date of Service: [name] [name] 03/19/2021 3:00 PM Medical Record Number: [id number] Patient Account Number: [id number] Date of Birth/Sex: Treating RN: 1951-08-09 (70 y.o. [name] Primary Care Provider: [name] Other Clinician: Referring Provider: Treating Provider/Extender: [name] in Treatment: 4 Active Problems ICD-10 Encounter Code Description Active Date MDM Diagnosis E11.621 Type 2 diabetes mellitus with foot ulcer 02/13/2021 No Yes L97.519 Non-pressure chronic ulcer of other part of right foot with unspecified severity 02/13/2021 No Yes I10 Essential (primary) hypertension 02/13/2021 No Yes Inactive Problems Resolved Problems Electronic Signature(s) Signed: 03/19/2021 5:22:48 PM By: Kalman Shan DO Entered By: Kalman Shan on 03/19/2021 17:18:16 --------------------------------------------------------------------------------  Progress Note Details Patient Name: Date of Service: [name], [name] 03/19/2021 3:00 PM Medical Record Number: [id number] Patient Account Number: [id number] Date of Birth/Sex: Treating RN: [birth year] (70 y.o. [name] Primary Care Provider: [name] Other Clinician: Referring Provider: Treating Provider/Extender: [name] in Treatment: 4 Subjective Chief Complaint Information obtained from Patient Right great toe wound History of Present Illness (HPI) Admission 6/28 Mr. Dayvian Niemeyer is a 70 year old male with a past medical history of uncontrolled insulin-dependent type 2 diabetes that presents to the wound care center for a 82-monthhistory of nonhealing wound to the right great toe. He states he has been following with podiatry for this issue. He is currently using antibiotic ointment to the area. He is wearing crocs today. He  denies Increased warmth, erythema or purulent drainage from the wound. 7/8; patient presents for 1 week follow-up. He had his x-ray of his right foot done today. He has an offloading shoe we gave him at last clinic visit that has cracked. Overall he is doing well. he has no issues with dressing changes. He denies signs of infection. 7/15; patient presents for 1 week follow-up. He continues to use his offloading shoe daily. He reports improvement to the wound size. He has no issues with dressing changes and denies signs of infection. 7/25; patient presents for 1 week follow-up. He has no issues or complaints today. He has been using silver alginate and his offloading shoe daily. He denies signs of infection. 8/1; patient presents for 1 week follow-up. He has no issues or complaints today. He continues to use silver alginate and his offloading shoe. He denies signs of infection. Patient History Information obtained from Patient. Family History Cancer - Mother,Maternal Grandparents,Father, Hypertension - Mother, Lung Disease - Father, No family history of Diabetes, Heart Disease, Hereditary Spherocytosis, Kidney Disease, Seizures, Stroke, Thyroid Problems, Tuberculosis. Social History Former smoker - smokeless tobacoo, Marital Status - Single, Alcohol Use - Rarely - beer, Drug Use - No History, Caffeine Use - Daily - coffee. Medical History Eyes Denies history of Cataracts, Glaucoma, Optic Neuritis Ear/Nose/Mouth/Throat Denies history of Chronic sinus problems/congestion, Middle ear problems Hematologic/Lymphatic Denies history of Anemia, Hemophilia, Human Immunodeficiency Virus, Lymphedema, Sickle Cell Disease Respiratory Denies history of Aspiration, Asthma, Chronic Obstructive Pulmonary Disease (COPD), Pneumothorax, Sleep Apnea, Tuberculosis Cardiovascular Patient has history of Myocardial Infarction - 01/10/2020 Denies history of Arrhythmia, Congestive Heart Failure, Coronary Artery  Disease, Deep Vein Thrombosis, Hypertension, Hypotension, Peripheral Arterial Disease, Peripheral Venous Disease, Phlebitis, Vasculitis Gastrointestinal Denies history of Cirrhosis , Colitis, Crohnoos, Hepatitis A, Hepatitis B, Hepatitis C Endocrine Patient has history of Type II Diabetes Denies history of Type I Diabetes Genitourinary Denies history of End Stage Renal Disease Immunological Denies history of Lupus Erythematosus, Raynaudoos, Scleroderma Integumentary (Skin) Denies history of History of Burn Musculoskeletal Patient has history of Osteoarthritis Denies history of Gout, Rheumatoid Arthritis, Osteomyelitis Neurologic Denies history of Dementia, Neuropathy, Quadriplegia, Paraplegia, Seizure Disorder Oncologic Denies history of Received Chemotherapy, Received Radiation Psychiatric Denies history of Anorexia/bulimia, Confinement Anxiety Hospitalization/Surgery History - 01/09/2020 STEMI stent placement. Medical A Surgical History Notes nd Musculoskeletal carpel tunnel 2021 right wrist Oncologic Skin [name] 10/21 Objective Constitutional respirations regular, non-labored and within target range for patient.. Vitals Time Taken: 3:27 PM, Height: 73 in, Weight: 250 lbs, BMI: 33, Temperature: 98.4 F, Pulse: 80 bpm, Respiratory Rate: 20 breaths/min, Blood Pressure: 146/84 mmHg. Cardiovascular 2+ dorsalis pedis/posterior tibialis pulses. Psychiatric pleasant and cooperative. General Notes: Open wound to the right  great toe with Circumferential callus. Granulation tissue present with some nonviable tissue. No obvious signs of infection. Integumentary (Hair, Skin) Wound #1 status is Open. Original cause of wound was Blister. The date acquired was: 05/23/2020. The wound has been in treatment 4 weeks. The wound is located on the Right T Great. The wound measures 0.2cm length x 0.2cm width x 0.3cm depth; 0.031cm^2 area and 0.009cm^3 volume. There is Fat Layer oe (Subcutaneous  Tissue) exposed. There is no tunneling or undermining noted. There is a medium amount of serosanguineous drainage noted. The wound margin is well defined and not attached to the wound base. There is large (67-100%) red granulation within the wound bed. There is no necrotic tissue within the wound bed. Assessment Active Problems ICD-10 Type 2 diabetes mellitus with foot ulcer Non-pressure chronic ulcer of other part of right foot with unspecified severity Essential (primary) hypertension I was able to remove the remaining callus today. I debrided nonviable tissue and there was excellent granulation tissue present. I recommended continuing silver alginate with dressing changes and offloading shoe. No signs of infection on exam. Follow-up in 2 weeks Procedures Wound #1 Pre-procedure diagnosis of Wound #1 is a Diabetic Wound/Ulcer of the Lower Extremity located on the Right T Great .Severity of Tissue Pre Debridement is: oe Fat layer exposed. There was a Excisional Skin/Subcutaneous Tissue Debridement with a total area of 2.25 sq cm performed by Kalman Shan, DO. With the following instrument(s): Curette to remove Viable and Non-Viable tissue/material. Material removed includes Callus, Subcutaneous Tissue, Slough, and Skin: Epidermis after achieving pain control using Lidocaine 4% Topical Solution. No specimens were taken. A time out was conducted at 16:15, prior to the start of the procedure. A Minimum amount of bleeding was controlled with Pressure. The procedure was tolerated well with a pain level of 0 throughout and a pain level of 0 following the procedure. Post Debridement Measurements: 0.4cm length x 0.4cm width x 0.3cm depth; 0.038cm^3 volume. Character of Wound/Ulcer Post Debridement is improved. Severity of Tissue Post Debridement is: Fat layer exposed. Post procedure Diagnosis Wound #1: Same as Pre-Procedure Plan Follow-up Appointments: Return Appointment in 2 weeks. - with Dr.  Heber [place] Edema Control - Lymphedema / SCD / Other: Elevate legs to the level of the heart or above for 30 minutes daily and/or when sitting, a frequency of: - 3-4 times a day throughout the day. Avoid standing for long periods of time. Off-Loading: Wedge shoe to: - right foot. Patient to wear while walking and standing. May wear regular shoe while driving. WOUND #1: - T Great Wound Laterality: Right oe Cleanser: Soap and Water 1 x Per Day/30 Days Discharge Instructions: May shower and wash wound with dial antibacterial soap and water prior to dressing change. Prim Dressing: KerraCel Ag Gelling Fiber Dressing, 2x2 in (silver alginate) (Generic) 1 x Per Day/30 Days ary Discharge Instructions: ***Lightly pack into wound bed.***Apply silver alginate to wound bed as instructed Secondary Dressing: Woven Gauze Sponges 2x2 in (Generic) 1 x Per Day/30 Days Discharge Instructions: Apply over primary dressing as directed. Secondary Dressing: Optifoam Non-Adhesive Dressing, 4x4 in (Generic) 1 x Per Day/30 Days Discharge Instructions: Foam donut Secured With: Conforming Stretch Gauze Bandage, Sterile 2x75 (in/in) (Generic) 1 x Per Day/30 Days Discharge Instructions: Secure with stretch gauze as directed. Secured With: 53M Medipore H Soft Cloth Surgical T 4 x 2 (in/yd) (Generic) 1 x Per Day/30 Days ape Discharge Instructions: Secure dressing with tape as directed. 1. In office sharp debridement 2. Continue silver  alginate and offloading shoe 3. Follow-up in 2 weeks Electronic Signature(s) Signed: 03/19/2021 5:22:48 PM By: Kalman Shan DO Entered By: Kalman Shan on 03/19/2021 17:21:17 -------------------------------------------------------------------------------- HxROS Details Patient Name: Date of Service: [name], [name] L. 03/19/2021 3:00 PM Medical Record Number: [id number] Patient Account Number: [id number] Date of Birth/Sex: Treating RN: 1951-08-14 (70 y.o. [name] Primary  Care Provider: [name] Other Clinician: Referring Provider: Treating Provider/Extender: [name] in Treatment: 4 Information Obtained From Patient Eyes Medical History: Negative for: Cataracts; Glaucoma; Optic Neuritis Ear/Nose/Mouth/Throat Medical History: Negative for: Chronic sinus problems/congestion; Middle ear problems Hematologic/Lymphatic Medical History: Negative for: Anemia; Hemophilia; Human Immunodeficiency Virus; Lymphedema; Sickle Cell Disease Respiratory Medical History: Negative for: Aspiration; Asthma; Chronic Obstructive Pulmonary Disease (COPD); Pneumothorax; Sleep Apnea; Tuberculosis Cardiovascular Medical History: Positive for: Myocardial Infarction - 01/10/2020 Negative for: Arrhythmia; Congestive Heart Failure; Coronary Artery Disease; Deep Vein Thrombosis; Hypertension; Hypotension; Peripheral Arterial Disease; Peripheral Venous Disease; Phlebitis; Vasculitis Gastrointestinal Medical History: Negative for: Cirrhosis ; Colitis; Crohns; Hepatitis A; Hepatitis B; Hepatitis C Endocrine Medical History: Positive for: Type II Diabetes Negative for: Type I Diabetes Time with diabetes: dx 2013 Treated with: Oral agents, Diet Blood sugar tested every day: Yes Tested : daily Genitourinary Medical History: Negative for: End Stage Renal Disease Immunological Medical History: Negative for: Lupus Erythematosus; Raynauds; Scleroderma Integumentary (Skin) Medical History: Negative for: History of Burn Musculoskeletal Medical History: Positive for: Osteoarthritis Negative for: Gout; Rheumatoid Arthritis; Osteomyelitis Past Medical History Notes: carpel tunnel 2021 right wrist Neurologic Medical History: Negative for: Dementia; Neuropathy; Quadriplegia; Paraplegia; Seizure Disorder Oncologic Medical History: Negative for: Received Chemotherapy; Received Radiation Past Medical History Notes: Skin [name]  10/21 Psychiatric Medical History: Negative for: Anorexia/bulimia; Confinement Anxiety Immunizations Pneumococcal Vaccine: Received Pneumococcal Vaccination: Yes Received Pneumococcal Vaccination On or After 60th Birthday: No Implantable Devices No devices added Hospitalization / Surgery History Type of Hospitalization/Surgery 01/09/2020 STEMI stent placement Family and Social History Cancer: Yes - Mother,Maternal Grandparents,Father; Diabetes: No; Heart Disease: No; Hereditary Spherocytosis: No; Hypertension: Yes - Mother; Kidney Disease: No; Lung Disease: Yes - Father; Seizures: No; Stroke: No; Thyroid Problems: No; Tuberculosis: No; Former smoker - smokeless tobacoo; Marital Status - Single; Alcohol Use: Rarely - beer; Drug Use: No History; Caffeine Use: Daily - coffee; Financial Concerns: No; Food, Clothing or Shelter Needs: No; Support System Lacking: No; Transportation Concerns: No Electronic Signature(s) Signed: 03/19/2021 5:22:48 PM By: Kalman Shan DO Signed: 03/19/2021 5:51:46 PM By: Levan Hurst RN, BSN Entered By: Kalman Shan on 03/19/2021 17:18:59 -------------------------------------------------------------------------------- SuperBill Details Patient Name: Date of Service: [name] 03/19/2021 Medical Record Number: [id number] Patient Account Number: [id number] Date of Birth/Sex: Treating RN: [birth year] (70 y.o. 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 I10 Essential (primary) hypertension Facility Procedures CPT4 Code: [id number] IC Description: [id number] - DEB SUBQ TISSUE 20 SQ CM/< ICD-10 Diagnosis Description D-10 Diagnosis Description  L97.519 Non-pressure chronic ulcer of other part of right foot with unspecified severit Modifier: y Quantity: 1 Physician Procedures : CPT4 Code Description Modifier [id number] - WC PHYS SUBQ TISS 20 SQ CM ICD-10 Diagnosis Description L97.519 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

## 2021-03-27 NOTE — Progress Notes (Signed)
Cardiology Office Note   Date:  03/29/2021   ID:  YADEL Green, DOB 1950-10-03, MRN [id number]  PCP:  [name] Macadam, MD  Cardiologist:  [name] [name], MD EP: None  Chief Complaint  Patient presents with   Follow-up    CAD, CHF      History of Present Illness: [name] is a 70 y.o. male with a PMH of CAD s/p STEMI 12/2019 managed with PCI/DES to LAD, chronic combined CHF/ICM with recovery of EF, HTN, HLD, DMt yep 2, and obesity,  who presents for routine follow-up  He was last evaluated by cardiology at an outpatient visit with Dr. [name] 08/2020 at which time he was doing well from a cardiology standpoint. He was exercising regularly. He was working with podiatry for management of a large ulcer on his right great toe; recent LEA dopplers were negative for PAD. He was encouraged to restart his metformin, otherwise no medication changes occurred. He was recommended to follow-up in 6 months with plans to discontinue brilinta 12/2020 if no changes to cardiac history. His last echocardiogram 04/2020 showed EF 50-55%, ongoing RWMA with mid-anteroseptal and mid-inferoseptal hypokinesis, G1DD, no significant valvular abnormalities, and mildly dilated aortic root (20m).   He presents today for routine follow-up. He has been doing fairly well since his last visit. He had to back off his exercise regimen while he was undergoing treatment for his right 1st toe ulcer which has been improving significantly since following at the wound clinic. He had a little superficial chest discomfort the other day which he attributed to increased exercise but no symptoms reminiscent of his MI. He has chronic DOE which is unchanged but no SOB at rest. No significant LE edema, orthopnea, PND, dizziness, lightheadedness, palpitations, or syncope. He reports last A1C level was low 7s. Unfortunately he has been unable to afford Jardiance over the past couple months as he is in the donut hole - would cost ~$400/mo.     Past Medical History:  Diagnosis Date   Allergy    Bilateral carpal tunnel syndrome 10/19/2019   Chronic kidney disease    Delusional disorder [place]    Diabetes type 2, uncontrolled [place] 03/26/2017    Past Surgical History:  Procedure Laterality Date   CORONARY STENT INTERVENTION N/A 01/10/2020   Procedure: CORONARY STENT INTERVENTION;  Surgeon: JMartinique [name] M, MD;  [facility];  Service: Cardiovascular;  Laterality: N/A;   CORONARY/GRAFT ACUTE MI REVASCULARIZATION N/A 01/10/2020   Procedure: Coronary/Graft Acute MI Revascularization;  Surgeon: JMartinique [name] M, MD;  [facility];  Service: Cardiovascular;  Laterality: N/A;   KNEE SURGERY Left    LEFT HEART CATH AND CORONARY ANGIOGRAPHY N/A 01/10/2020   Procedure: LEFT HEART CATH AND CORONARY ANGIOGRAPHY;  Surgeon: JMartinique [name] M, MD;  [facility];  Service: Cardiovascular;  Laterality: N/A;   TONSILLECTOMY Bilateral      Current Outpatient Medications  Medication Sig Dispense Refill   aspirin 81 MG chewable tablet Chew 1 tablet (81 mg total) by mouth daily. 30 tablet 11   carvedilol (COREG) 6.25 MG tablet Take 1 tablet (6.25 mg total) by mouth 2 (two) times daily with a meal. 60 tablet 11   ketoconazole (NIZORAL) 2 % cream ketoconazole 2 % topical cream     losartan (COZAAR) 25 MG tablet Take 1 tablet (25 mg total) by mouth daily. 30 tablet 11   metFORMIN (GLUCOPHAGE) 500 MG tablet Take 1 tablet (500 mg  total) by mouth 2 (two) times daily with a meal. (Patient taking differently: Take 1,000 mg by mouth daily with breakfast.)     Multiple Vitamin (MULTIVITAMIN) tablet Take 1 tablet by mouth daily.     nitroGLYCERIN (NITROSTAT) 0.4 MG SL tablet Place 1 tablet (0.4 mg total) under the tongue every 5 (five) minutes x 3 doses as needed for chest pain. 25 tablet 1   Psyllium (METAMUCIL FIBER PO) Take by mouth.     rosuvastatin (CRESTOR) 20 MG tablet Take 20 mg by mouth daily.     VITAMIN  E PO Take 1 tablet by mouth daily.     No current facility-administered medications for this visit.    Allergies:   Achromycin [tetracycline] and Januvia [sitagliptin]    Social History:  The patient  reports that he has never smoked. He has never used smokeless tobacco. He reports that he does not drink alcohol and does not use drugs.   Family History:  The patient's family history includes Hypertension in his father.    ROS:  Please see the history of present illness.   Otherwise, review of systems are positive for none.   All other systems are reviewed and negative.    PHYSICAL EXAM: VS:  BP 120/72 (BP Location: Right Arm, Patient Position: Sitting, Cuff Size: Large)   Pulse 79   Ht '6\' 1"'$  (1.854 m)   Wt 261 lb 12.8 oz (118.8 kg)   SpO2 98%   BMI 34.54 kg/m  , BMI Body mass index is 34.54 kg/m. GEN: Well nourished, well developed, in no acute distress HEENT: sclera anicteric Neck: no JVD, carotid bruits, or masses Cardiac: RRR; no murmurs, rubs, or gallops, trace LE edema  Respiratory:  clear to auscultation bilaterally, normal work of breathing GI: soft, nontender, nondistended, + BS MS: no deformity or atrophy Skin: warm and dry, no rash Neuro:  Strength and sensation are intact Psych: euthymic mood, full affect   EKG:  EKG is ordered today. The ekg ordered today demonstrates sinus rhythm, rate 79 bpm, no STE/D, no TWI, no significant change from previous.    Recent Labs: No results found for requested labs within last 8760 hours.    Lipid Panel    Component Value Date/Time   CHOL 113 03/03/2020 0902   TRIG 90 03/03/2020 0902   HDL 41 03/03/2020 0902   CHOLHDL 2.8 03/03/2020 0902   CHOLHDL 4.1 01/10/2020 0000   VLDL 19 01/10/2020 0000   LDLCALC 54 03/03/2020 0902      Wt Readings from Last 3 Encounters:  03/29/21 261 lb 12.8 oz (118.8 kg)  08/31/20 255 lb (115.7 kg)  04/27/20 241 lb 3.2 oz (109.4 kg)      Other studies Reviewed: Additional  studies/ records that were reviewed today include:   Echocardiogram 04/2020:  1. Left ventricular ejection fraction, by estimation, is 50 to 55%. The  left ventricle has low normal function. The left ventricle demonstrates  regional wall motion abnormalities with mid anteroseptal and mid  inferoseptal hypokinesis. Left ventricular  diastolic parameters are consistent with Grade I diastolic dysfunction  (impaired relaxation).   2. Right ventricular systolic function is normal. The right ventricular  size is normal. Tricuspid regurgitation signal is inadequate for assessing  PA pressure.   3. The mitral valve is normal in structure. Trivial mitral valve  regurgitation. No evidence of mitral stenosis.   4. The aortic valve is tricuspid. Aortic valve regurgitation is trivial.  No aortic stenosis is present.  5. Aortic dilatation noted. There is mild dilatation of the aortic root  measuring 38 mm.   6. The inferior vena cava is normal in size with greater than 50%  respiratory variability, suggesting right atrial pressure of 3 mmHg.   LHC 12/2019:  - Prox LAD to Mid LAD lesion is 100% stenosed.  - 1st Diag lesion is 75% stenosed.  - Post intervention, there is a 0% residual stenosis.  - A drug-eluting stent was successfully placed using a SYNERGY XD [id number].  - LV end diastolic pressure is normal.     1. Single vessel occlusive CAD. 100% proximal to mid LAD. 75% first diagonal  2. Normal LVEDP 15 mm Hg  3. Successful PCI of the LAD with DES x 1     Plan: DAPT for one year. Risk factor modification. Will assess LV function with Echo.   Echocardiogram 12/2019:  1. Left ventricular ejection fraction, by estimation, is 40 to 45%. The  left ventricle has mildly decreased function. The left ventricle has no  regional wall motion abnormalities. There is moderate concentric left  ventricular hypertrophy. Left  ventricular diastolic parameters are consistent with Grade I diastolic   dysfunction (impaired relaxation).   2. Right ventricular systolic function is normal. The right ventricular  size is normal.   3. The mitral valve is grossly normal. No evidence of mitral valve  regurgitation.   4. The aortic valve is normal in structure. Aortic valve regurgitation is  not visualized. No aortic stenosis is present.   5. IVC measures 2.53cm.      ASSESSMENT AND PLAN:  1. CAD s/p STEMI 12/2019 with PCI/DES to LAD: had residual medically managed 75% D1 stenosis, otherwise normal coronaries. No anginal complaints. Cleared to stop brilinta via telephone call 01/2021 as part of the preop coverage.  - Continue aspirin and statin  - Continue BBlocker   2. Chronic combined CHF/ICM: subsequent recovery of EF from 40-45% 12/2019 up to 50-55% 04/2020. No volume overload complaints and appears euvolemic on exam  - Continue carvedilol and losartan  3. HTN: BP 120/72 today  - Continue carvedilol and losartan   4. HLD: LDL 60 12/2020  - Continue crestor   5. DM type 2: A1C 8.3 12/2020, reportedly in the low 7's 01/2021  - Continue metformin  - Given information to apply for jardiance assistance    Current medicines are reviewed at length with the patient today.  The patient does not have concerns regarding medicines.  The following changes have been made:  As above  Labs/ tests ordered today include:   Orders Placed This Encounter  Procedures   EKG 12-Lead     Disposition:   FU with Dr. [name] in 6 months  Signed, Abigail Butts, PA-C  03/29/2021 10:08 AM

## 2021-03-29 ENCOUNTER — Encounter: Payer: Self-pay | Admitting: Medical

## 2021-03-29 ENCOUNTER — Other Ambulatory Visit: Payer: Self-pay

## 2021-03-29 ENCOUNTER — Ambulatory Visit: Payer: HMO | Admitting: Medical

## 2021-03-29 VITALS — BP 120/72 | HR 79 | Ht 73.0 in | Wt 261.8 lb

## 2021-03-29 DIAGNOSIS — I5042 Chronic combined systolic (congestive) and diastolic (congestive) heart failure: Secondary | ICD-10-CM | POA: Diagnosis not present

## 2021-03-29 DIAGNOSIS — I10 Essential (primary) hypertension: Secondary | ICD-10-CM | POA: Diagnosis not present

## 2021-03-29 DIAGNOSIS — I255 Ischemic cardiomyopathy: Secondary | ICD-10-CM | POA: Diagnosis not present

## 2021-03-29 DIAGNOSIS — E119 Type 2 diabetes mellitus without complications: Secondary | ICD-10-CM

## 2021-03-29 DIAGNOSIS — I2510 Atherosclerotic heart disease of native coronary artery without angina pectoris: Secondary | ICD-10-CM

## 2021-03-29 DIAGNOSIS — E7800 Pure hypercholesterolemia, unspecified: Secondary | ICD-10-CM | POA: Diagnosis not present

## 2021-03-29 NOTE — Patient Instructions (Signed)
Medication Instructions:  No Changes *If you need a refill on your cardiac medications before your next appointment, please call your pharmacy*   Lab Work: No Labs If you have labs (blood work) drawn today and your tests are completely normal, you will receive your results only by: [place] (if you have MyChart) OR A paper copy in the mail If you have any lab test that is abnormal or we need to change your treatment, we will call you to review the results.   Testing/Procedures: No Testing   Follow-Up: At [facility], you and your health needs are our priority.  As part of our continuing mission to provide you with exceptional heart care, we have created designated Provider Care Teams.  These Care Teams include your primary Cardiologist (physician) and Advanced Practice Providers (APPs -  Physician Assistants and Nurse Practitioners) who all work together to provide you with the care you need, when you need it.  We recommend signing up for the patient portal called "MyChart".  Sign up information is provided on this After Visit Summary.  MyChart is used to connect with patients for Virtual Visits (Telemedicine).  Patients are able to view lab/test results, encounter notes, upcoming appointments, etc.  Non-urgent messages can be sent to your provider as well.   To learn more about what you can do with MyChart, go to [email or web address].    Your next appointment:   6 month(s)  The format for your next appointment:   In Person  Provider:   [name] [name], MD

## 2021-04-03 ENCOUNTER — Other Ambulatory Visit: Payer: Self-pay

## 2021-04-03 ENCOUNTER — Encounter (HOSPITAL_BASED_OUTPATIENT_CLINIC_OR_DEPARTMENT_OTHER): Payer: HMO | Admitting: Internal Medicine

## 2021-04-03 DIAGNOSIS — E11621 Type 2 diabetes mellitus with foot ulcer: Secondary | ICD-10-CM

## 2021-04-03 DIAGNOSIS — I10 Essential (primary) hypertension: Secondary | ICD-10-CM | POA: Diagnosis not present

## 2021-04-03 DIAGNOSIS — L97519 Non-pressure chronic ulcer of other part of right foot with unspecified severity: Secondary | ICD-10-CM | POA: Diagnosis not present

## 2021-04-03 NOTE — Progress Notes (Signed)
[name], [name] L. ([id number]) Visit Report for 03/19/2021 Arrival Information Details Patient Name: Date of Service: [name], [name] 03/19/2021 3:00 PM Medical Record Number: [id number] Patient Account Number: [id number] Date of Birth/Sex: Treating RN: Apr 09, 1951 (70 y.o. [name] Primary Care Kabella Cassidy: [name] Other Clinician: Referring Freedom Peddy: Treating Liana Camerer/Extender: [name] in Treatment: 4 Visit Information History Since Last Visit Added or deleted any medications: No Patient Arrived: Ambulatory Any new allergies or adverse reactions: No Arrival Time: 15:25 Had a fall or experienced change in No Accompanied By: self activities of daily living that may affect Transfer Assistance: None risk of falls: Patient Identification Verified: Yes Signs or symptoms of abuse/neglect since last visito No Secondary Verification Process Completed: Yes Hospitalized since last visit: No Patient Requires Transmission-Based Precautions: No Implantable device outside of the clinic excluding No Patient Has Alerts: No cellular tissue based products placed in the center since last visit: Has Dressing in Place as Prescribed: Yes Pain Present Now: No Electronic Signature(s) Signed: 04/03/2021 11:33:27 AM By: Sandre Kitty Entered By: Sandre Kitty on 03/19/2021 15:27:00 -------------------------------------------------------------------------------- Encounter Discharge Information Details Patient Name: Date of Service: [name] [name] Mohair L. 03/19/2021 3:00 PM Medical Record Number: [id number] Patient Account Number: [id number] Date of Birth/Sex: Treating RN: 1951/01/21 (70 y.o. [name] Primary Care Shatisha Falter: [name] Other Clinician: Referring Jeffey Janssen: Treating Hatley Henegar/Extender: [name] in Treatment: 4 Encounter Discharge Information Items Post Procedure Vitals Discharge Condition:  Stable Temperature (F): 98.4 Ambulatory Status: Ambulatory Pulse (bpm): 80 Discharge Destination: Home Respiratory Rate (breaths/min): 18 Transportation: Private Auto Blood Pressure (mmHg): 146/84 Accompanied By: self Schedule Follow-up Appointment: Yes Clinical Summary of Care: Patient Declined Electronic Signature(s) Signed: 03/19/2021 6:11:21 PM By: Baruch Gouty RN, BSN Entered By: Baruch Gouty on 03/19/2021 16:27:55 -------------------------------------------------------------------------------- Lower Extremity Assessment Details Patient Name: Date of Service: [name] [name] Parsons. 03/19/2021 3:00 PM Medical Record Number: [id number] Patient Account Number: [id number] Date of Birth/Sex: Treating RN: 1951/07/28 (70 y.o. [name] Primary Care Elimelech Houseman: [name] Other Clinician: Referring Delwyn Scoggin: Treating Javontay Vandam/Extender: [name] in Treatment: 4 Edema Assessment Assessed: Shirlyn Goltz: No] Patrice Paradise: No] Edema: [Left: Green] [Right: o] Calf Left: Right: Point of Measurement: 32 cm From Medial Instep 42 cm Ankle Left: Right: Point of Measurement: 11 cm From Medial Instep 24.2 cm Vascular Assessment Pulses: Dorsalis Pedis Palpable: [Right:Yes] Electronic Signature(s) Signed: 03/19/2021 5:51:46 PM By: Levan Hurst RN, BSN Entered By: Levan Hurst on 03/19/2021 15:43:40 -------------------------------------------------------------------------------- Multi Wound Chart Details Patient Name: Date of Service: [name] [name] Mohair L. 03/19/2021 3:00 PM Medical Record Number: [id number] Patient Account Number: [id number] Date of Birth/Sex: Treating RN: Jan 16, 1951 (70 y.o. [name] Primary Care Von Quintanar: [name] Other Clinician: Referring Chosen Geske: Treating Langley Ingalls/Extender: [name] in Treatment: 4 Vital Signs Height(in): 73 Pulse(bpm): 80 Weight(lbs): 250 Blood Pressure(mmHg): 146/84 Body  Mass Index(BMI): 33 Temperature(F): 98.4 Respiratory Rate(breaths/min): 20 Photos: [1:Right T Great oe] [Green/A:Green/A Green/A] Wound Location: [1:Blister] [Green/A:Green/A] Wounding Event: [1:Diabetic Wound/Ulcer of the Lower] [Green/A:Green/A] Primary Etiology: [1:Extremity Myocardial Infarction, Type II] [Green/A:Green/A] Comorbid History: [1:Diabetes, Osteoarthritis 05/23/2020] [Green/A:Green/A] Date Acquired: [1:4] [Green/A:Green/A] Weeks of Treatment: [1:Open] [Green/A:Green/A] Wound Status: [1:0.2x0.2x0.3] [Green/A:Green/A] Measurements L x W x D (cm) [1:0.031] [Green/A:Green/A] A (cm) : rea [1:0.009] [Green/A:Green/A] Volume (cm) : [1:80.30%] [Green/A:Green/A] % Reduction in A [1:rea: 90.40%] [Green/A:Green/A] % Reduction in Volume: [1:Grade 2] [Green/A:Green/A] Classification: [1:Medium] [Green/A:Green/A] Exudate A mount: [1:Serosanguineous] [Green/A:Green/A] Exudate Type: [1:red, brown] [Green/A:Green/A] Exudate  Color: [1:Well defined, not attached] [Green/A:Green/A] Wound Margin: [1:Large (67-100%)] [Green/A:Green/A] Granulation A mount: [1:Red] [Green/A:Green/A] Granulation Quality: [1:None Present (0%)] [Green/A:Green/A] Necrotic A mount: [1:Fat Layer (Subcutaneous Tissue): Yes Green/A] Exposed Structures: [1:Fascia: No Tendon: No Muscle: No Joint: No Bone: No None] [Green/A:Green/A] Epithelialization: [1:Debridement - Excisional] [Green/A:Green/A] Debridement: Pre-procedure Verification/Time Out 16:15 [Green/A:Green/A] Taken: [1:Lidocaine 4% Topical Solution] [Green/A:Green/A] Pain Control: [1:Callus, Subcutaneous, Slough] [Green/A:Green/A] Tissue Debrided: [1:Skin/Subcutaneous Tissue] [Green/A:Green/A] Level: [1:2.25] [Green/A:Green/A] Debridement A (sq cm): [1:rea Curette] [Green/A:Green/A] Instrument: [1:Minimum] [Green/A:Green/A] Bleeding: [1:Pressure] [Green/A:Green/A] Hemostasis A chieved: [1:0] [Green/A:Green/A] Procedural Pain: [1:0] [Green/A:Green/A] Post Procedural Pain: [1:Procedure was tolerated well] [Green/A:Green/A] Debridement Treatment Response: [1:0.4x0.4x0.3] [Green/A:Green/A] Post Debridement Measurements L x W x D (cm) [1:0.038] [Green/A:Green/A] Post Debridement Volume: (cm) [1:Debridement] [Green/A:Green/A] Treatment  Notes Wound #1 (Toe Great) Wound Laterality: Right Cleanser Soap and Water Discharge Instruction: May shower and wash wound with dial antibacterial soap and water prior to dressing change. Peri-Wound Care Topical Primary Dressing KerraCel Ag Gelling Fiber Dressing, 2x2 in (silver alginate) Discharge Instruction: ***Lightly pack into wound bed.***Apply silver alginate to wound bed as instructed Secondary Dressing Woven Gauze Sponges 2x2 in Discharge Instruction: Apply over primary dressing as directed. Optifoam Non-Adhesive Dressing, 4x4 in Discharge Instruction: Foam donut Secured With Conforming Stretch Gauze Bandage, Sterile 2x75 (in/in) Discharge Instruction: Secure with stretch gauze as directed. 18M Medipore H Soft Cloth Surgical T 4 x 2 (in/yd) ape Discharge Instruction: Secure dressing with tape as directed. Compression Wrap Compression Stockings Add-Ons Electronic Signature(s) Signed: 03/19/2021 5:22:48 PM By: Kalman Shan DO Signed: 03/19/2021 5:51:46 PM By: Levan Hurst RN, BSN Entered By: Kalman Shan on 03/19/2021 17:18:22 -------------------------------------------------------------------------------- Multi-Disciplinary Care Plan Details Patient Name: Date of Service: [name] [name] Mohair L. 03/19/2021 3:00 PM Medical Record Number: [id number] Patient Account Number: [id number] Date of Birth/Sex: Treating RN: June 06, 1951 (70 y.o. [name] Primary Care Emaly Boschert: [name] Other Clinician: Referring Jonika Critz: Treating Margretta Zamorano/Extender: [name] in Treatment: 4 Active Inactive Wound/Skin Impairment Nursing Diagnoses: Knowledge deficit related to ulceration/compromised skin integrity Goals: Patient/caregiver will verbalize understanding of skin care regimen Date Initiated: 02/13/2021 Target Resolution Date: 04/20/2021 Goal Status: Active Interventions: Assess patient/caregiver ability to perform ulcer/skin care regimen  upon admission and as needed Assess ulceration(s) every visit Provide education on ulcer and skin care Treatment Activities: Skin care regimen initiated : 02/13/2021 Topical wound management initiated : 02/13/2021 Notes: Electronic Signature(s) Signed: 03/19/2021 5:51:46 PM By: Levan Hurst RN, BSN Entered By: Levan Hurst on 03/19/2021 15:45:00 -------------------------------------------------------------------------------- Pain Assessment Details Patient Name: Date of Service: [name] [name] Mohair L. 03/19/2021 3:00 PM Medical Record Number: [id number] Patient Account Number: [id number] Date of Birth/Sex: Treating RN: 1951/05/23 (70 y.o. [name] Primary Care Arie Gable: [name] Other Clinician: Referring Keiara Sneeringer: Treating Regis Wiland/Extender: [name] in Treatment: 4 Active Problems Location of Pain Severity and Description of Pain Patient Has Paino No Site Locations Pain Management and Medication Current Pain Management: Electronic Signature(s) Signed: 03/19/2021 5:51:46 PM By: Levan Hurst RN, BSN Signed: 04/03/2021 11:33:27 AM By: Sandre Kitty Entered By: Sandre Kitty on 03/19/2021 15:27:27 -------------------------------------------------------------------------------- Patient/Caregiver Education Details Patient Name: Date of Service: [name] [name] Parsons 8/1/2022andnbsp3:00 PM Medical Record Number: [id number] Patient Account Number: [id number] Date of Birth/Gender: Treating RN: 1951/05/20 (70 y.o. [name] Primary Care Physician: [name] Other Clinician: Referring Physician: Treating Physician/Extender: [name] in Treatment: 4 Education Assessment Education Provided To: Patient Education Topics Provided Wound/Skin Impairment: Methods: Explain/Verbal Responses: State content correctly  Electronic Signature(s) Signed: 03/19/2021 5:51:46 PM By: Levan Hurst RN, BSN Entered  By: Levan Hurst on 03/19/2021 15:45:11 -------------------------------------------------------------------------------- Wound Assessment Details Patient Name: Date of Service: [name], [name] L. 03/19/2021 3:00 PM Medical Record Number: [id number] Patient Account Number: [id number] Date of Birth/Sex: Treating RN: Mar 28, 1951 (70 y.o. [name] Primary Care Kassidee Narciso: [name] Other Clinician: Referring Ethell Blatchford: Treating Deborahann Poteat/Extender: [name] in Treatment: 4 Wound Status Wound Number: 1 Primary Etiology: Diabetic Wound/Ulcer of the Lower Extremity Wound Location: Right T Great oe Wound Status: Open Wounding Event: Blister Comorbid History: Myocardial Infarction, Type II Diabetes, Osteoarthritis Date Acquired: 05/23/2020 Weeks Of Treatment: 4 Clustered Wound: No Photos Wound Measurements Length: (cm) 0.2 Width: (cm) 0.2 Depth: (cm) 0.3 Area: (cm) 0.031 Volume: (cm) 0.009 % Reduction in Area: 80.3% % Reduction in Volume: 90.4% Epithelialization: None Tunneling: No Undermining: No Wound Description Classification: Grade 2 Wound Margin: Well defined, not attached Exudate Amount: Medium Exudate Type: Serosanguineous Exudate Color: red, brown Foul Odor After Cleansing: No Slough/Fibrino Yes Wound Bed Granulation Amount: Large (67-100%) Exposed Structure Granulation Quality: Red Fascia Exposed: No Necrotic Amount: None Present (0%) Fat Layer (Subcutaneous Tissue) Exposed: Yes Tendon Exposed: No Muscle Exposed: No Joint Exposed: No Bone Exposed: No Treatment Notes Wound #1 (Toe Great) Wound Laterality: Right Cleanser Soap and Water Discharge Instruction: May shower and wash wound with dial antibacterial soap and water prior to dressing change. Peri-Wound Care Topical Primary Dressing KerraCel Ag Gelling Fiber Dressing, 2x2 in (silver alginate) Discharge Instruction: ***Lightly pack into wound bed.***Apply silver  alginate to wound bed as instructed Secondary Dressing Woven Gauze Sponges 2x2 in Discharge Instruction: Apply over primary dressing as directed. Optifoam Non-Adhesive Dressing, 4x4 in Discharge Instruction: Foam donut Secured With Conforming Stretch Gauze Bandage, Sterile 2x75 (in/in) Discharge Instruction: Secure with stretch gauze as directed. 53M Medipore H Soft Cloth Surgical T 4 x 2 (in/yd) ape Discharge Instruction: Secure dressing with tape as directed. Compression Wrap Compression Stockings Add-Ons Electronic Signature(s) Signed: 03/19/2021 5:51:46 PM By: Levan Hurst RN, BSN Entered By: Levan Hurst on 03/19/2021 15:43:57 -------------------------------------------------------------------------------- Vitals Details Patient Name: Date of Service: [name], [name] L. 03/19/2021 3:00 PM Medical Record Number: [id number] Patient Account Number: [id number] Date of Birth/Sex: Treating RN: 06/02/1951 (70 y.o. [name] Primary Care Blake Vetrano: [name] Other Clinician: Referring Dorr Perrot: Treating Cameren Earnest/Extender: [name] in Treatment: 4 Vital Signs Time Taken: 15:27 Temperature (F): 98.4 Height (in): 73 Pulse (bpm): 80 Weight (lbs): 250 Respiratory Rate (breaths/min): 20 Body Mass Index (BMI): 33 Blood Pressure (mmHg): 146/84 Reference Range: 80 - 120 mg / dl Electronic Signature(s) Signed: 04/03/2021 11:33:27 AM By: Sandre Kitty Entered By: Sandre Kitty on 03/19/2021 15:27:19

## 2021-04-04 DIAGNOSIS — S90421A Blister (nonthermal), right great toe, initial encounter: Secondary | ICD-10-CM | POA: Diagnosis not present

## 2021-04-04 NOTE — Progress Notes (Signed)
Twaddle, Cutberto L. ([id number]) Visit Report for 04/03/2021 Chief Complaint Document Details Patient Name: Date of Service: [place] 04/03/2021 2:30 PM Medical Record Number: [id number] Patient Account Number: [id number] Date of Birth/Sex: Treating RN: 07-21-1951 (70 y.o. [name] Primary Care Provider: [name] Other Clinician: Referring Provider: Treating Provider/Extender: [name] in Treatment: 7 Information Obtained from: Patient Chief Complaint Right great toe wound Electronic Signature(s) Signed: 04/04/2021 10:28:29 AM By: Kalman Shan DO Entered By: Kalman Shan on 04/04/2021 10:22:48 -------------------------------------------------------------------------------- Debridement Details Patient Name: Date of Service: [name] 04/03/2021 2:30 PM Medical Record Number: [id number] Patient Account Number: [id number] Date of Birth/Sex: Treating RN: [birth year] (70 y.o. [name] Primary Care Provider: [name] Other Clinician: Referring Provider: Treating Provider/Extender: [name] in Treatment: 7 Debridement Performed for Assessment: Wound #1 Right T Great oe Performed By: Physician Kalman Shan, DO Debridement Type: Debridement Severity of Tissue Pre Debridement: Fat layer exposed Level of Consciousness (Pre-procedure): Awake and Alert Pre-procedure Verification/Time Out Yes - 15:30 Taken: Start Time: 15:30 T Area Debrided (L x W): otal 0.5 (cm) x 2 (cm) = 1 (cm) Tissue and other material debrided: Viable, Non-Viable, Callus, Subcutaneous Level: Skin/Subcutaneous Tissue Debridement Description: Excisional Instrument: Blade, Curette, Forceps Bleeding: Minimum Hemostasis Achieved: Pressure End Time: 15:34 Procedural Pain: 0 Post Procedural Pain: 0 Response to Treatment: Procedure was tolerated well Level of Consciousness (Post- Awake and  Alert procedure): Post Debridement Measurements of Total Wound Length: (cm) 0.5 Width: (cm) 2 Depth: (cm) 0.1 Volume: (cm) 0.079 Character of Wound/Ulcer Post Debridement: Improved Severity of Tissue Post Debridement: Fat layer exposed Post Procedure Diagnosis Same as Pre-procedure Electronic Signature(s) Signed: 04/03/2021 5:48:55 PM By: Levan Hurst RN, BSN Signed: 04/04/2021 10:28:29 AM By: Kalman Shan DO Entered By: Levan Hurst on 04/03/2021 15:38:51 -------------------------------------------------------------------------------- HPI Details Patient Name: Date of Service: [name] 04/03/2021 2:30 PM Medical Record Number: [id number] Patient Account Number: [id number] Date of Birth/Sex: Treating RN: [birth year] (70 y.o. [name] Primary Care Provider: [name] Other Clinician: Referring Provider: Treating Provider/Extender: [name] in Treatment: 7 History of Present Illness HPI Description: Admission 6/28 Mr. Herrick Hafeman is a 69 year old male with a past medical history of uncontrolled insulin-dependent type 2 diabetes that presents to the wound care center for a 70-monthhistory of nonhealing wound to the right great toe. He states he has been following with podiatry for this issue. He is currently using antibiotic ointment to the area. He is wearing crocs today. He denies Increased warmth, erythema or purulent drainage from the wound. 7/8; patient presents for 1 week follow-up. He had his x-ray of his right foot done today. He has an offloading shoe we gave him at last clinic visit that has cracked. Overall he is doing well. he has no issues with dressing changes. He denies signs of infection. 7/15; patient presents for 1 week follow-up. He continues to use his offloading shoe daily. He reports improvement to the wound size. He has no issues with dressing changes and denies signs of infection. 7/25; patient  presents for 1 week follow-up. He has no issues or complaints today. He has been using silver alginate and his offloading shoe daily. He denies signs of infection. 8/1; patient presents for 1 week follow-up. He has no issues or complaints today. He continues to use silver alginate and his offloading shoe. He denies signs of infection. 8/16; patient presents for 2-week  follow-up. He states that he developed another wound to his right great toe in the past week. He was vacuuming and thinks that the friction against his tennis shoe caused a blister. He popped it and states there was drainage with improvement to the wound site. He has been currently keeping the area covered. He denies signs of infection. He does try to use his offloading shoe when he is not doing housework. Electronic Signature(s) Signed: 04/04/2021 10:28:29 AM By: Kalman Shan DO Entered By: Kalman Shan on 04/04/2021 10:25:17 -------------------------------------------------------------------------------- Physical Exam Details Patient Name: Date of Service: [name] 04/03/2021 2:30 PM Medical Record Number: [id number] Patient Account Number: [id number] Date of Birth/Sex: Treating RN: 08/22/1950 (70 y.o. [name] Primary Care Provider: [name] Other Clinician: Referring Provider: Treating Provider/Extender: [name] in Treatment: 7 Constitutional respirations regular, non-labored and within target range for patient.. Cardiovascular 2+ dorsalis pedis/posterior tibialis pulses. Psychiatric pleasant and cooperative. Notes Open wound to the right great toe with Circumferential callus. Granulation tissue present with some nonviable tissue. No obvious signs of infection. Electronic Signature(s) Signed: 04/04/2021 10:28:29 AM By: Kalman Shan DO Entered By: Kalman Shan on 04/04/2021  10:25:57 -------------------------------------------------------------------------------- Physician Orders Details Patient Name: Date of Service: [name] 04/03/2021 2:30 PM Medical Record Number: [id number] Patient Account Number: [id number] Date of Birth/Sex: Treating RN: 1951-05-05 (70 y.o. [name] Primary Care Provider: [name] Other Clinician: Referring Provider: Treating Provider/Extender: [name] in Treatment: 7 Verbal / Phone Orders: No Diagnosis Coding ICD-10 Coding Code Description E11.621 Type 2 diabetes mellitus with foot ulcer L97.519 Non-pressure chronic ulcer of other part of right foot with unspecified severity I10 Essential (primary) hypertension Follow-up Appointments ppointment in 1 week. - with Dr. Heber [place] Return A Edema Control - Lymphedema / SCD / Other Elevate legs to the level of the heart or above for 30 minutes daily and/or when sitting, a frequency of: - 3-4 times a day throughout the day. Avoid standing for long periods of time. Off-Loading Wedge shoe to: - right foot. Patient to wear while walking and standing. May wear regular shoe while driving. Wound Treatment Wound #1 - T Great oe Wound Laterality: Right Cleanser: Soap and Water 1 x Per Day/30 Days Discharge Instructions: May shower and wash wound with dial antibacterial soap and water prior to dressing change. Prim Dressing: KerraCel Ag Gelling Fiber Dressing, 2x2 in (silver alginate) (DME) (Generic) 1 x Per Day/30 Days ary Discharge Instructions: ***Lightly pack into wound bed.***Apply silver alginate to wound bed as instructed Secondary Dressing: Woven Gauze Sponges 2x2 in (Generic) 1 x Per Day/30 Days Discharge Instructions: Apply over primary dressing as directed. Secondary Dressing: Optifoam Non-Adhesive Dressing, 4x4 in (Generic) 1 x Per Day/30 Days Discharge Instructions: Foam donut Secured With: Conforming Stretch Gauze Bandage,  Sterile 2x75 (in/in) (Generic) 1 x Per Day/30 Days Discharge Instructions: Secure with stretch gauze as directed. Secured With: 30M Medipore H Soft Cloth Surgical T 4 x 2 (in/yd) (Generic) 1 x Per Day/30 Days ape Discharge Instructions: Secure dressing with tape as directed. Electronic Signature(s) Signed: 04/04/2021 10:28:29 AM By: Kalman Shan DO Previous Signature: 04/03/2021 5:48:55 PM Version By: Levan Hurst RN, BSN Entered By: Kalman Shan on 04/04/2021 10:26:16 -------------------------------------------------------------------------------- Problem List Details Patient Name: Date of Service: [name] 04/03/2021 2:30 PM Medical Record Number: [id number] Patient Account Number: [id number] Date of Birth/Sex: Treating RN: April 20, 1951 (70 y.o. [name] Primary Care Provider: Mannie Stabile,  Apolonio Schneiders Other Clinician: Referring Provider: Treating Provider/Extender: [name] in Treatment: 7 Active Problems ICD-10 Encounter Code Description Active Date MDM Diagnosis E11.621 Type 2 diabetes mellitus with foot ulcer 02/13/2021 No Yes L97.519 Non-pressure chronic ulcer of other part of right foot with unspecified severity 02/13/2021 No Yes I10 Essential (primary) hypertension 02/13/2021 No Yes Inactive Problems Resolved Problems Electronic Signature(s) Signed: 04/04/2021 10:28:29 AM By: Kalman Shan DO Previous Signature: 04/03/2021 5:48:55 PM Version By: Levan Hurst RN, BSN Entered By: Kalman Shan on 04/04/2021 10:22:29 -------------------------------------------------------------------------------- Progress Note Details Patient Name: Date of Service: [name] 04/03/2021 2:30 PM Medical Record Number: [id number] Patient Account Number: [id number] Date of Birth/Sex: Treating RN: Dec 31, 1950 (70 y.o. [name] Primary Care Provider: [name] Other Clinician: Referring Provider: Treating Provider/Extender:  [name] in Treatment: 7 Subjective Chief Complaint Information obtained from Patient Right great toe wound History of Present Illness (HPI) Admission 6/28 Mr. Aaidan Render is a 70 year old male with a past medical history of uncontrolled insulin-dependent type 2 diabetes that presents to the wound care center for a 32-monthhistory of nonhealing wound to the right great toe. He states he has been following with podiatry for this issue. He is currently using antibiotic ointment to the area. He is wearing crocs today. He denies Increased warmth, erythema or purulent drainage from the wound. 7/8; patient presents for 1 week follow-up. He had his x-ray of his right foot done today. He has an offloading shoe we gave him at last clinic visit that has cracked. Overall he is doing well. he has no issues with dressing changes. He denies signs of infection. 7/15; patient presents for 1 week follow-up. He continues to use his offloading shoe daily. He reports improvement to the wound size. He has no issues with dressing changes and denies signs of infection. 7/25; patient presents for 1 week follow-up. He has no issues or complaints today. He has been using silver alginate and his offloading shoe daily. He denies signs of infection. 8/1; patient presents for 1 week follow-up. He has no issues or complaints today. He continues to use silver alginate and his offloading shoe. He denies signs of infection. 8/16; patient presents for 2-week follow-up. He states that he developed another wound to his right great toe in the past week. He was vacuuming and thinks that the friction against his tennis shoe caused a blister. He popped it and states there was drainage with improvement to the wound site. He has been currently keeping the area covered. He denies signs of infection. He does try to use his offloading shoe when he is not doing housework. Patient History Information  obtained from Patient. Family History Cancer - Mother,Maternal Grandparents,Father, Hypertension - Mother, Lung Disease - Father, No family history of Diabetes, Heart Disease, Hereditary Spherocytosis, Kidney Disease, Seizures, Stroke, Thyroid Problems, Tuberculosis. Social History Former smoker - smokeless tobacoo, Marital Status - Single, Alcohol Use - Rarely - beer, Drug Use - No History, Caffeine Use - Daily - coffee. Medical History Eyes Denies history of Cataracts, Glaucoma, Optic Neuritis Ear/Nose/Mouth/Throat Denies history of Chronic sinus problems/congestion, Middle ear problems Hematologic/Lymphatic Denies history of Anemia, Hemophilia, Human Immunodeficiency Virus, Lymphedema, Sickle Cell Disease Respiratory Denies history of Aspiration, Asthma, Chronic Obstructive Pulmonary Disease (COPD), Pneumothorax, Sleep Apnea, Tuberculosis Cardiovascular Patient has history of Myocardial Infarction - 01/10/2020 Denies history of Arrhythmia, Congestive Heart Failure, Coronary Artery Disease, Deep Vein Thrombosis, Hypertension, Hypotension, Peripheral Arterial Disease, Peripheral Venous Disease,  Phlebitis, Vasculitis Gastrointestinal Denies history of Cirrhosis , Colitis, Crohnoos, Hepatitis A, Hepatitis B, Hepatitis C Endocrine Patient has history of Type II Diabetes Denies history of Type I Diabetes Genitourinary Denies history of End Stage Renal Disease Immunological Denies history of Lupus Erythematosus, Raynaudoos, Scleroderma Integumentary (Skin) Denies history of History of Burn Musculoskeletal Patient has history of Osteoarthritis Denies history of Gout, Rheumatoid Arthritis, Osteomyelitis Neurologic Denies history of Dementia, Neuropathy, Quadriplegia, Paraplegia, Seizure Disorder Oncologic Denies history of Received Chemotherapy, Received Radiation Psychiatric Denies history of Anorexia/bulimia, Confinement Anxiety Hospitalization/Surgery History - 01/09/2020 STEMI  stent placement. Medical A Surgical History Notes nd Musculoskeletal carpel tunnel 2021 right wrist Oncologic Skin Ca 10/21 Objective Constitutional respirations regular, non-labored and within target range for patient.. Vitals Time Taken: 2:49 PM, Height: 73 in, Weight: 250 lbs, BMI: 33, Temperature: 98.6 F, Pulse: 82 bpm, Respiratory Rate: 20 breaths/min, Blood Pressure: 145/88 mmHg. Cardiovascular 2+ dorsalis pedis/posterior tibialis pulses. Psychiatric pleasant and cooperative. General Notes: Open wound to the right great toe with Circumferential callus. Granulation tissue present with some nonviable tissue. No obvious signs of infection. Integumentary (Hair, Skin) Wound #1 status is Open. Original cause of wound was Blister. The date acquired was: 05/23/2020. The wound has been in treatment 7 weeks. The wound is located on the Right T Great. The wound measures 0.5cm length x 2cm width x 0.1cm depth; 0.785cm^2 area and 0.079cm^3 volume. There is Fat Layer oe (Subcutaneous Tissue) exposed. There is no tunneling noted, however, there is undermining starting at 12:00 and ending at 12:00 with a maximum distance of 0.5cm. There is a medium amount of serosanguineous drainage noted. The wound margin is well defined and not attached to the wound base. There is large (67- 100%) red granulation within the wound bed. There is no necrotic tissue within the wound bed. General Notes: callous noted to periwound. Wound #2 status is Open. Original cause of wound was Gradually Appeared. The date acquired was: 04/03/2021. The wound is located on the Right,Medial T [place]. The wound measures 0.5cm length x 0.3cm width x 0.2cm depth; 0.118cm^2 area and 0.024cm^3 volume. There is Fat Layer (Subcutaneous Tissue) exposed. There is no tunneling or undermining noted. There is a medium amount of serosanguineous drainage noted. The wound margin is distinct with the outline attached to the wound base. There is  large (67-100%) red granulation within the wound bed. There is no necrotic tissue within the wound bed. General Notes: callous to periwound. 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 I debrided all nonviable tissue. Post debridement tissue appears healthy. I recommended continuing silver alginate and using offloading shoe. Procedures Wound #1 Pre-procedure diagnosis of Wound #1 is a Diabetic Wound/Ulcer of the Lower Extremity located on the Right T Great .Severity of Tissue Pre Debridement is: oe Fat layer exposed. There was a Excisional Skin/Subcutaneous Tissue Debridement with a total area of 1 sq cm performed by Kalman Shan, DO. With the following instrument(s): Blade, Curette, and Forceps to remove Viable and Non-Viable tissue/material. Material removed includes Callus and Subcutaneous Tissue and. No specimens were taken. A time out was conducted at 15:30, prior to the start of the procedure. A Minimum amount of bleeding was controlled with Pressure. The procedure was tolerated well with a pain level of 0 throughout and a pain level of 0 following the  procedure. Post Debridement Measurements: 0.5cm length x 2cm width x 0.1cm depth; 0.079cm^3 volume. Character of Wound/Ulcer Post Debridement is improved. Severity of Tissue Post Debridement is: Fat layer exposed. Post procedure Diagnosis Wound #1: Same as Pre-Procedure Plan Follow-up Appointments: Return Appointment in 1 week. - with Dr. Heber [place] Edema Control - Lymphedema / SCD / Other: Elevate legs to the level of the heart or above for 30 minutes daily and/or when sitting, a frequency of: - 3-4 times a day throughout the day. Avoid standing for long periods of time. Off-Loading: Wedge shoe to: - right foot. Patient to  wear while walking and standing. May wear regular shoe while driving. WOUND #1: - T Great Wound Laterality: Right oe Cleanser: Soap and Water 1 x Per Day/30 Days Discharge Instructions: May shower and wash wound with dial antibacterial soap and water prior to dressing change. Prim Dressing: KerraCel Ag Gelling Fiber Dressing, 2x2 in (silver alginate) (DME) (Generic) 1 x Per Day/30 Days ary Discharge Instructions: ***Lightly pack into wound bed.***Apply silver alginate to wound bed as instructed Secondary Dressing: Woven Gauze Sponges 2x2 in (Generic) 1 x Per Day/30 Days Discharge Instructions: Apply over primary dressing as directed. Secondary Dressing: Optifoam Non-Adhesive Dressing, 4x4 in (Generic) 1 x Per Day/30 Days Discharge Instructions: Foam donut Secured With: Conforming Stretch Gauze Bandage, Sterile 2x75 (in/in) (Generic) 1 x Per Day/30 Days Discharge Instructions: Secure with stretch gauze as directed. Secured With: 41M Medipore H Soft Cloth Surgical T 4 x 2 (in/yd) (Generic) 1 x Per Day/30 Days ape Discharge Instructions: Secure dressing with tape as directed. 1. In office sharp debridement 2. Continue aggressive offloading and silver alginate with dressing changes 3. Follow-up in 1 week Electronic Signature(s) Signed: 04/04/2021 10:28:29 AM By: Kalman Shan DO Entered By: Kalman Shan on 04/04/2021 10:27:47 -------------------------------------------------------------------------------- HxROS Details Patient Name: Date of Service: [place] Dan Humphreys, Yahia L. 04/03/2021 2:30 PM Medical Record Number: [id number] Patient Account Number: [id number] Date of Birth/Sex: Treating RN: 12-Feb-1951 (70 y.o. [name] Primary Care Provider: [name] Other Clinician: Referring Provider: Treating Provider/Extender: [name] in Treatment: 7 Information Obtained From Patient Eyes Medical History: Negative for: Cataracts; Glaucoma; Optic  Neuritis Ear/Nose/Mouth/Throat Medical History: Negative for: Chronic sinus problems/congestion; Middle ear problems Hematologic/Lymphatic Medical History: Negative for: Anemia; Hemophilia; Human Immunodeficiency Virus; Lymphedema; Sickle Cell Disease Respiratory Medical History: Negative for: Aspiration; Asthma; Chronic Obstructive Pulmonary Disease (COPD); Pneumothorax; Sleep Apnea; Tuberculosis Cardiovascular Medical History: Positive for: Myocardial Infarction - 01/10/2020 Negative for: Arrhythmia; Congestive Heart Failure; Coronary Artery Disease; Deep Vein Thrombosis; Hypertension; Hypotension; Peripheral Arterial Disease; Peripheral Venous Disease; Phlebitis; Vasculitis Gastrointestinal Medical History: Negative for: Cirrhosis ; Colitis; Crohns; Hepatitis A; Hepatitis B; Hepatitis C Endocrine Medical History: Positive for: Type II Diabetes Negative for: Type I Diabetes Time with diabetes: dx 2013 Treated with: Oral agents, Diet Blood sugar tested every day: Yes Tested : daily Genitourinary Medical History: Negative for: End Stage Renal Disease Immunological Medical History: Negative for: Lupus Erythematosus; Raynauds; Scleroderma Integumentary (Skin) Medical History: Negative for: History of Burn Musculoskeletal Medical History: Positive for: Osteoarthritis Negative for: Gout; Rheumatoid Arthritis; Osteomyelitis Past Medical History Notes: carpel tunnel 2021 right wrist Neurologic Medical History: Negative for: Dementia; Neuropathy; Quadriplegia; Paraplegia; Seizure Disorder Oncologic Medical History: Negative for: Received Chemotherapy; Received Radiation Past Medical History Notes: Skin Ca 10/21 Psychiatric Medical History: Negative for: Anorexia/bulimia; Confinement Anxiety Immunizations Pneumococcal Vaccine: Received Pneumococcal Vaccination: Yes Received Pneumococcal Vaccination On or After 60th Birthday: No Implantable Devices No devices  added  Hospitalization / Surgery History Type of Hospitalization/Surgery 01/09/2020 STEMI stent placement Family and Social History Cancer: Yes - Mother,Maternal Grandparents,Father; Diabetes: No; Heart Disease: No; Hereditary Spherocytosis: No; Hypertension: Yes - Mother; Kidney Disease: No; Lung Disease: Yes - Father; Seizures: No; Stroke: No; Thyroid Problems: No; Tuberculosis: No; Former smoker - smokeless tobacoo; Marital Status - Single; Alcohol Use: Rarely - beer; Drug Use: No History; Caffeine Use: Daily - coffee; Financial Concerns: No; Food, Clothing or Shelter Needs: No; Support System Lacking: No; Transportation Concerns: No Electronic Signature(s) Signed: 04/04/2021 10:28:29 AM By: Kalman Shan DO Signed: 04/04/2021 6:11:36 PM By: Levan Hurst RN, BSN Entered By: Kalman Shan on 04/04/2021 10:25:26 -------------------------------------------------------------------------------- [facility] Details Patient Name: Date of Service: [name] 04/03/2021 Medical Record Number: [id number] Patient Account Number: [id number] Date of Birth/Sex: Treating RN: 30-Oct-1950 (70 y.o. 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 I10 Essential (primary) hypertension Facility Procedures CPT4 Code: [id number] Description: [id number] - DEB SUBQ TISSUE 20 SQ CM/< ICD-10 Diagnosis Description E11.621 Type 2 diabetes mellitus with foot ulcer L97.519 Non-pressure chronic ulcer of other part of right foot with unspecified severi I10 Essential (primary) hypertension Modifier: ty Quantity: 1 Physician Procedures : CPT4 Code Description Modifier [id number] - WC PHYS SUBQ TISS 20  SQ CM ICD-10 Diagnosis Description E11.621 Type 2 diabetes mellitus with foot ulcer L97.519 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

## 2021-04-04 NOTE — Progress Notes (Signed)
Courter, Jatavious L. ([id number]) Visit Report for 04/03/2021 Arrival Information Details Patient Name: Date of Service: [name], [name] 04/03/2021 2:30 PM Medical Record Number: [id number] Patient Account Number: [id number] Date of Birth/Sex: Treating RN: 08-08-1951 (70 y.o. [name] Primary Care Kareen Jefferys: [name] Other Clinician: Referring Theus Espin: Treating Dailey Alberson/Extender: [name] in Treatment: 7 Visit Information History Since Last Visit Added or deleted any medications: No Patient Arrived: Ambulatory Any new allergies or adverse reactions: No Arrival Time: 14:49 Had a fall or experienced change in No Accompanied By: self activities of daily living that may affect Transfer Assistance: None risk of falls: Patient Identification Verified: Yes Signs or symptoms of abuse/neglect since last visito No Secondary Verification Process Completed: Yes Hospitalized since last visit: No Patient Requires Transmission-Based Precautions: No Implantable device outside of the clinic excluding No Patient Has Alerts: No cellular tissue based products placed in the center since last visit: Has Dressing in Place as Prescribed: Yes Pain Present Now: No Electronic Signature(s) Signed: 04/04/2021 2:50:45 PM By: Sandre Kitty Entered By: Sandre Kitty on 04/03/2021 14:49:58 -------------------------------------------------------------------------------- Encounter Discharge Information Details Patient Name: Date of Service: [name]. 04/03/2021 2:30 PM Medical Record Number: [id number] Patient Account Number: [id number] Date of Birth/Sex: Treating RN: 1951/08/19 (70 y.o. [name] Primary Care Jerel Sardina: [name] Other Clinician: Referring Lennin Osmond: Treating Deaveon Schoen/Extender: [name] in Treatment: 7 Encounter Discharge Information Items Post Procedure Vitals Discharge Condition:  Stable Temperature (F): 98.6 Ambulatory Status: Ambulatory Pulse (bpm): 82 Discharge Destination: Home Respiratory Rate (breaths/min): 20 Transportation: Private Auto Blood Pressure (mmHg): 145/88 Accompanied By: self Schedule Follow-up Appointment: Yes Clinical Summary of Care: Electronic Signature(s) Signed: 04/03/2021 5:19:53 PM By: Deon Pilling Entered By: Deon Pilling on 04/03/2021 16:30:37 -------------------------------------------------------------------------------- Lower Extremity Assessment Details Patient Name: Date of Service: [name], [name] 04/03/2021 2:30 PM Medical Record Number: [id number] Patient Account Number: [id number] Date of Birth/Sex: Treating RN: [birth year] (70 y.o. [name] Primary Care Skarleth Delmonico: [name] Other Clinician: Referring Umar Patmon: Treating Miguelangel Korn/Extender: [name] in Treatment: 7 Edema Assessment Assessed: [Left: No] [Right: Yes] Edema: [Left: N] [Right: o] Calf Left: Right: Point of Measurement: 32 cm From Medial Instep 44 cm Ankle Left: Right: Point of Measurement: 11 cm From Medial Instep 24 cm Vascular Assessment Pulses: Dorsalis Pedis Palpable: [Right:Yes] Electronic Signature(s) Signed: 04/03/2021 5:19:53 PM By: Deon Pilling Entered By: Deon Pilling on 04/03/2021 15:05:07 -------------------------------------------------------------------------------- Multi Wound Chart Details Patient Name: Date of Service: [name]. 04/03/2021 2:30 PM Medical Record Number: [id number] Patient Account Number: [id number] Date of Birth/Sex: Treating RN: April 13, 1951 (70 y.o. [name] Primary Care Alveta Quintela: [name] Other Clinician: Referring Esmeralda Blanford: Treating Guinevere Stephenson/Extender: [name] in Treatment: 7 Vital Signs Height(in): 73 Pulse(bpm): 82 Weight(lbs): 250 Blood Pressure(mmHg): 145/88 Body Mass Index(BMI): 33 Temperature(F):  98.6 Respiratory Rate(breaths/min): 20 Photos: [1:Right T Great oe] [2:Right, Medial T Great oe] [N/A:N/A N/A] Wound Location: [1:Blister] [2:Gradually Appeared] [N/A:N/A] Wounding Event: [1:Diabetic Wound/Ulcer of the Lower] [2:Diabetic Wound/Ulcer of the Lower] [N/A:N/A] Primary Etiology: [1:Extremity Myocardial Infarction, Type II] [2:Extremity Myocardial Infarction, Type II] [N/A:N/A] Comorbid History: [1:Diabetes, Osteoarthritis 05/23/2020] [2:Diabetes, Osteoarthritis 04/03/2021] [N/A:N/A] Date Acquired: [1:7] [2:0] [N/A:N/A] Weeks of Treatment: [1:Open] [2:Open] [N/A:N/A] Wound Status: [1:0.5x2x0.1] [2:0.5x0.3x0.2] [N/A:N/A] Measurements L x W x D (cm) [1:0.785] [2:0.118] [N/A:N/A] A (cm) : rea [1:0.079] [2:0.024] [N/A:N/A] Volume (cm) : [1:-400.00%] [2:N/A] [N/A:N/A] % Reduction in A [1:rea: 16.00%] [2:N/A] [N/A:N/A] %  Reduction in Volume: [1:12] Starting Position 1 (o'clock): [1:12] Ending Position 1 (o'clock): [1:0.5] Maximum Distance 1 (cm): [1:Yes] [2:No] [N/A:N/A] Undermining: [1:Grade 2] [2:Grade 2] [N/A:N/A] Classification: [1:Medium] [2:Medium] [N/A:N/A] Exudate A mount: [1:Serosanguineous] [2:Serosanguineous] [N/A:N/A] Exudate Type: [1:red, brown] [2:red, brown] [N/A:N/A] Exudate Color: [1:Well defined, not attached] [2:Distinct, outline attached] [N/A:N/A] Wound Margin: [1:Large (67-100%)] [2:Large (67-100%)] [N/A:N/A] Granulation A mount: [1:Red] [2:Red] [N/A:N/A] Granulation Quality: [1:None Present (0%)] [2:None Present (0%)] [N/A:N/A] Necrotic A mount: [1:Fat Layer (Subcutaneous Tissue): Yes Fat Layer (Subcutaneous Tissue): Yes N/A] Exposed Structures: [1:Fascia: No Tendon: No Muscle: No Joint: No Bone: No None] [2:Fascia: No Tendon: No Muscle: No Joint: No Bone: No None] [N/A:N/A] Epithelialization: [1:Debridement - Excisional] [2:N/A] [N/A:N/A] Debridement: Pre-procedure Verification/Time Out 15:30 [2:N/A] [N/A:N/A] Taken: [1:Callus, Subcutaneous] [2:N/A]  [N/A:N/A] Tissue Debrided: [1:Skin/Subcutaneous Tissue] [2:N/A] [N/A:N/A] Level: [1:1] [2:N/A] [N/A:N/A] Debridement A (sq cm): [1:rea Blade, Curette, Forceps] [2:N/A] [N/A:N/A] Instrument: [1:Minimum] [2:N/A] [N/A:N/A] Bleeding: [1:Pressure] [2:N/A] [N/A:N/A] Hemostasis A chieved: [1:0] [2:N/A] [N/A:N/A] Procedural Pain: [1:0] [2:N/A] [N/A:N/A] Post Procedural Pain: [1:Procedure was tolerated well] [2:N/A] [N/A:N/A] Debridement Treatment Response: [1:0.5x2x0.1] [2:N/A] [N/A:N/A] Post Debridement Measurements L x W x D (cm) [1:0.079] [2:N/A] [N/A:N/A] Post Debridement Volume: (cm) [1:callous noted to periwound.] [2:callous to periwound.] [N/A:N/A] Assessment Notes: [1:Debridement] [2:N/A] [N/A:N/A] Treatment Notes Wound #1 (Toe Great) Wound Laterality: Right Cleanser Soap and Water Discharge Instruction: May shower and wash wound with dial antibacterial soap and water prior to dressing change. Peri-Wound Care Topical Primary Dressing KerraCel Ag Gelling Fiber Dressing, 2x2 in (silver alginate) Discharge Instruction: ***Lightly pack into wound bed.***Apply silver alginate to wound bed as instructed Secondary Dressing Woven Gauze Sponges 2x2 in Discharge Instruction: Apply over primary dressing as directed. Optifoam Non-Adhesive Dressing, 4x4 in Discharge Instruction: Foam donut Secured With Conforming Stretch Gauze Bandage, Sterile 2x75 (in/in) Discharge Instruction: Secure with stretch gauze as directed. 52M Medipore H Soft Cloth Surgical T 4 x 2 (in/yd) ape Discharge Instruction: Secure dressing with tape as directed. Compression Wrap Compression Stockings Add-Ons Wound #2 (Toe Great) Wound Laterality: Right, Medial Cleanser Peri-Wound Care Topical Primary Dressing Secondary Dressing Secured With Compression Wrap Compression Stockings Add-Ons Electronic Signature(s) Signed: 04/04/2021 10:28:29 AM By: Kalman Shan DO Signed: 04/04/2021 6:11:36 PM By: Levan Hurst  RN, BSN Entered By: Kalman Shan on 04/04/2021 10:22:39 -------------------------------------------------------------------------------- Multi-Disciplinary Care Plan Details Patient Name: Date of Service: [name]. 04/03/2021 2:30 PM Medical Record Number: [id number] Patient Account Number: [id number] Date of Birth/Sex: Treating RN: [birth year] (70 y.o. [name] Primary Care Ertha Nabor: [name] Other Clinician: Referring Margaret Staggs: Treating Carnelia Oscar/Extender: [name] in Treatment: 7 Active Inactive Wound/Skin Impairment Nursing Diagnoses: Knowledge deficit related to ulceration/compromised skin integrity Goals: Patient/caregiver will verbalize understanding of skin care regimen Date Initiated: 02/13/2021 Target Resolution Date: 04/20/2021 Goal Status: Active Interventions: Assess patient/caregiver ability to perform ulcer/skin care regimen upon admission and as needed Assess ulceration(s) every visit Provide education on ulcer and skin care Treatment Activities: Skin care regimen initiated : 02/13/2021 Topical wound management initiated : 02/13/2021 Notes: Electronic Signature(s) Signed: 04/03/2021 5:48:55 PM By: Levan Hurst RN, BSN Entered By: Levan Hurst on 04/03/2021 15:34:45 -------------------------------------------------------------------------------- Pain Assessment Details Patient Name: Date of Service: [name] [name] Mohair L. 04/03/2021 2:30 PM Medical Record Number: [id number] Patient Account Number: [id number] Date of Birth/Sex: Treating RN: 03-Dec-1950 (70 y.o. [name] Primary Care Tulsi Crossett: [name] Other Clinician: Referring Emmely Bittinger: Treating Artia Singley/Extender: [name] in Treatment: 7 Active Problems Location of Pain Severity and Description  of Pain Patient Has Paino No Site Locations Pain Management and Medication Current Pain Management: Electronic  Signature(s) Signed: 04/03/2021 5:48:55 PM By: Levan Hurst RN, BSN Signed: 04/04/2021 2:50:45 PM By: Sandre Kitty Entered By: Sandre Kitty on 04/03/2021 14:50:17 -------------------------------------------------------------------------------- Patient/Caregiver Education Details Patient Name: Date of Service: [name] 8/16/2022andnbsp2:30 PM Medical Record Number: [id number] Patient Account Number: [id number] Date of Birth/Gender: Treating RN: 02-Oct-1950 (70 y.o. [name] Primary Care Physician: [name] Other Clinician: Referring Physician: Treating Physician/Extender: [name] in Treatment: 7 Education Assessment Education Provided To: Patient Education Topics Provided Wound/Skin Impairment: Methods: Explain/Verbal Responses: State content correctly Electronic Signature(s) Signed: 04/03/2021 5:48:55 PM By: Levan Hurst RN, BSN Entered By: Levan Hurst on 04/03/2021 15:34:56 -------------------------------------------------------------------------------- Wound Assessment Details Patient Name: Date of Service: [name] [name] Mohair L. 04/03/2021 2:30 PM Medical Record Number: [id number] Patient Account Number: [id number] Date of Birth/Sex: Treating RN: 12-13-1950 (70 y.o. [name] Primary Care Sahmir Weatherbee: [name] Other Clinician: Referring Kiyaan Haq: Treating Rowe Warman/Extender: [name] in Treatment: 7 Wound Status Wound Number: 1 Primary Etiology: Diabetic Wound/Ulcer of the Lower Extremity Wound Location: Right T Great oe Wound Status: Open Wounding Event: Blister Comorbid History: Myocardial Infarction, Type II Diabetes, Osteoarthritis Date Acquired: 05/23/2020 Weeks Of Treatment: 7 Clustered Wound: No Photos Photo Uploaded By: [name] Wound Measurements Length: (cm) 0.5 Width: (cm) 2 Depth: (cm) 0.1 Area: (cm) 0.785 Volume:  (cm) 0.079 % Reduction in Area: -400% % Reduction in Volume: 16% Epithelialization: None Tunneling: No Undermining: Yes Starting Position (o'clock): 12 Ending Position (o'clock): 12 Maximum Distance: (cm) 0.5 Wound Description Classification: Grade 2 Wound Margin: Well defined, not attached Exudate Amount: Medium Exudate Type: Serosanguineous Exudate Color: red, brown Foul Odor After Cleansing: No Slough/Fibrino Yes Wound Bed Granulation Amount: Large (67-100%) Exposed Structure Granulation Quality: Red Fascia Exposed: No Necrotic Amount: None Present (0%) Fat Layer (Subcutaneous Tissue) Exposed: Yes Tendon Exposed: No Muscle Exposed: No Joint Exposed: No Bone Exposed: No Assessment Notes callous noted to periwound. Treatment Notes Wound #1 (Toe Great) Wound Laterality: Right Cleanser Soap and Water Discharge Instruction: May shower and wash wound with dial antibacterial soap and water prior to dressing change. Peri-Wound Care Topical Primary Dressing KerraCel Ag Gelling Fiber Dressing, 2x2 in (silver alginate) Discharge Instruction: ***Lightly pack into wound bed.***Apply silver alginate to wound bed as instructed Secondary Dressing Woven Gauze Sponges 2x2 in Discharge Instruction: Apply over primary dressing as directed. Optifoam Non-Adhesive Dressing, 4x4 in Discharge Instruction: Foam donut Secured With Conforming Stretch Gauze Bandage, Sterile 2x75 (in/in) Discharge Instruction: Secure with stretch gauze as directed. 5M Medipore H Soft Cloth Surgical T 4 x 2 (in/yd) ape Discharge Instruction: Secure dressing with tape as directed. Compression Wrap Compression Stockings Add-Ons Electronic Signature(s) Signed: 04/03/2021 5:48:55 PM By: Levan Hurst RN, BSN Entered By: Levan Hurst on 04/03/2021 15:32:17 -------------------------------------------------------------------------------- Wound Assessment Details Patient Name: Date of Service: [name] [name] Mohair L. 04/03/2021 2:30 PM Medical Record Number: [id number] Patient Account Number: [id number] Date of Birth/Sex: Treating RN: [birth year] (70 y.o. [name] Primary Care Airianna Kreischer: [name] Other Clinician: Referring Kirklin Mcduffee: Treating Talecia Sherlin/Extender: [name] in Treatment: 7 Wound Status Wound Number: 2 Primary Etiology: Diabetic Wound/Ulcer of the Lower Extremity Wound Location: Right, Medial T Great oe Wound Status: Open Wounding Event: Gradually Appeared Comorbid History: Myocardial Infarction, Type II Diabetes, Osteoarthritis Date Acquired: 04/03/2021 Weeks Of Treatment: 0 Clustered Wound: No Photos Photo Uploaded  By: [name] on 04/03/2021 16:55:27 Wound Measurements Length: (cm) 0.5 Width: (cm) 0.3 Depth: (cm) 0.2 Area: (cm) 0.118 Volume: (cm) 0.024 % Reduction in Area: % Reduction in Volume: Epithelialization: None Tunneling: No Undermining: No Wound Description Classification: Grade 2 Wound Margin: Distinct, outline attached Exudate Amount: Medium Exudate Type: Serosanguineous Exudate Color: red, brown Foul Odor After Cleansing: No Slough/Fibrino No Wound Bed Granulation Amount: Large (67-100%) Exposed Structure Granulation Quality: Red Fascia Exposed: No Necrotic Amount: None Present (0%) Fat Layer (Subcutaneous Tissue) Exposed: Yes Tendon Exposed: No Muscle Exposed: No Joint Exposed: No Bone Exposed: No Assessment Notes callous to periwound. Electronic Signature(s) Signed: 04/03/2021 5:19:53 PM By: Deon Pilling Entered By: Deon Pilling on 04/03/2021 15:11:24 -------------------------------------------------------------------------------- Vitals Details Patient Name: Date of Service: [name] [name] Mohair L. 04/03/2021 2:30 PM Medical Record Number: [id number] Patient Account Number: [id number] Date of Birth/Sex: Treating RN: 1951/07/08 (70 y.o. [name] Primary Care Mazin Emma: [name] Other Clinician: Referring Laurene Melendrez: Treating Jillann Charette/Extender: [name] in Treatment: 7 Vital Signs Time Taken: 14:49 Temperature (F): 98.6 Height (in): 73 Pulse (bpm): 82 Weight (lbs): 250 Respiratory Rate (breaths/min): 20 Body Mass Index (BMI): 33 Blood Pressure (mmHg): 145/88 Reference Range: 80 - 120 mg / dl Electronic Signature(s) Signed: 04/04/2021 2:50:45 PM By: Sandre Kitty Entered By: Sandre Kitty on 04/03/2021 14:50:11

## 2021-04-10 ENCOUNTER — Other Ambulatory Visit: Payer: Self-pay

## 2021-04-10 ENCOUNTER — Encounter (HOSPITAL_BASED_OUTPATIENT_CLINIC_OR_DEPARTMENT_OTHER): Payer: HMO | Admitting: Internal Medicine

## 2021-04-10 DIAGNOSIS — I10 Essential (primary) hypertension: Secondary | ICD-10-CM | POA: Diagnosis not present

## 2021-04-10 DIAGNOSIS — E11621 Type 2 diabetes mellitus with foot ulcer: Secondary | ICD-10-CM | POA: Diagnosis not present

## 2021-04-10 DIAGNOSIS — L97519 Non-pressure chronic ulcer of other part of right foot with unspecified severity: Secondary | ICD-10-CM | POA: Diagnosis not present

## 2021-04-10 NOTE — Progress Notes (Signed)
[name], [name] ([id number]) Visit Report for 04/10/2021 Chief Complaint Document Details Patient Name: Date of Service: [name], [name] 04/10/2021 1:00 PM Medical Record Number: [id number] Patient Account Number: [id number] Date of Birth/Sex: Treating RN: Nov 12, 1950 (70 y.o. [name] Primary Care Provider: [name] Other Clinician: Referring Provider: Treating Provider/Extender: [name] in Treatment: 8 Information Obtained from: Patient Chief Complaint Right great toe wound Electronic Signature(s) Signed: 04/10/2021 2:05:39 PM By: Kalman Shan DO Entered By: Kalman Shan on 04/10/2021 13:59:00 -------------------------------------------------------------------------------- HPI Details Patient Name: Date of Service: [name] [name] 04/10/2021 1:00 PM Medical Record Number: [id number] Patient Account Number: [id number] Date of Birth/Sex: Treating RN: 09/01/1950 (70 y.o. [name] Primary Care Provider: [name] Other Clinician: Referring Provider: Treating Provider/Extender: [name] in Treatment: 8 History of Present Illness HPI Description: Admission 6/28 Mr. Aamil Merchant is a 70 year old male with a past medical history of uncontrolled insulin-dependent type 2 diabetes that presents to the wound care center for a 48-monthhistory of nonhealing wound to the right great toe. He states he has been following with podiatry for this issue. He is currently using antibiotic ointment to the area. He is wearing crocs today. He denies Increased warmth, erythema or purulent drainage from the wound. 7/8; patient presents for 1 week follow-up. He had his x-ray of his right foot done today. He has an offloading shoe we gave him at last clinic visit that has cracked. Overall he is doing well. he has no issues with dressing changes. He denies signs of infection. 7/15; patient presents for 1 week  follow-up. He continues to use his offloading shoe daily. He reports improvement to the wound size. He has no issues with dressing changes and denies signs of infection. 7/25; patient presents for 1 week follow-up. He has no issues or complaints today. He has been using silver alginate and his offloading shoe daily. He denies signs of infection. 8/1; patient presents for 1 week follow-up. He has no issues or complaints today. He continues to use silver alginate and his offloading shoe. He denies signs of infection. 8/16; patient presents for 2-week follow-up. He states that he developed another wound to his right great toe in the past week. He was vacuuming and thinks that the friction against his tennis shoe caused a blister. He popped it and states there was drainage with improvement to the wound site. He has been currently keeping the area covered. He denies signs of infection. He does try to use his offloading shoe when he is not doing housework. 8/23; patient presents for 1 week follow-up. He continues to offload aggressively with his front offloading shoe and uses silver alginate. He denies any drainage. He denies signs of infection. Electronic Signature(s) Signed: 04/10/2021 2:05:39 PM By: HKalman ShanDO Entered By: HKalman Shanon 04/10/2021 13:59:44 -------------------------------------------------------------------------------- Physical Exam Details Patient Name: Date of Service: [name] SKATHAN, [name]8/23/2022 1:00 PM Medical Record Number: [id number] Account Number: [id number] of Birth/Sex: Treating RN: 8July 30, 1952(70y.o. MJanyth ContesPrimary Care Provider: HCaren MacadamOther Clinician: Referring Provider: Treating Provider/Extender: HMarene Lenzin Treatment: 8 Constitutional respirations regular, non-labored and within target range for patient..Marland KitchenPsychiatric pleasant and cooperative. Notes Epithelialization and callus to  previous wound site. No drainage noted upon palpation. No signs of infection. Electronic Signature(s) Signed: 04/10/2021 2:05:39 PM By: HKalman ShanDO Entered By: HKalman Shanon 04/10/2021 14:00:40 -------------------------------------------------------------------------------- Physician Orders  Details Patient Name: Date of Service: DEMARR, KANGAS 04/10/2021 1:00 PM Medical Record Number: [id number] Patient Account Number: [id number] Date of Birth/Sex: Treating RN: [birth year] (70 y.o. [name] Primary Care Provider: [name] Other Clinician: Referring Provider: Treating Provider/Extender: [name] in Treatment: 8 Verbal / Phone Orders: No Diagnosis Coding ICD-10 Coding Code Description E11.621 Type 2 diabetes mellitus with foot ulcer L97.519 Non-pressure chronic ulcer of other part of right foot with unspecified severity I10 Essential (primary) hypertension Discharge From [facility] Discharge from [facility] healed!!! Additional Orders / Instructions Other: - continue to keep right great toe protected with foam for the next month Electronic Signature(s) Signed: 04/10/2021 2:05:39 PM By: Kalman Shan DO Entered By: Kalman Shan on 04/10/2021 14:01:02 -------------------------------------------------------------------------------- Problem List Details Patient Name: Date of Service: [name] [name]. 04/10/2021 1:00 PM Medical Record Number: [id number] Patient Account Number: [id number] Date of Birth/Sex: Treating RN: 04-May-1951 (70 y.o. [name] Primary Care Provider: [name] Other Clinician: Referring Provider: Treating Provider/Extender: [name] in Treatment: 8 Active Problems ICD-10 Encounter Code Description Active Date MDM Diagnosis E11.621 Type 2 diabetes mellitus with foot ulcer 02/13/2021 No Yes L97.519 Non-pressure chronic ulcer of other part  of right foot with unspecified severity 02/13/2021 No Yes I10 Essential (primary) hypertension 02/13/2021 No Yes Inactive Problems Resolved Problems Electronic Signature(s) Signed: 04/10/2021 2:05:39 PM By: Kalman Shan DO Entered By: Kalman Shan on 04/10/2021 13:58:45 -------------------------------------------------------------------------------- Progress Note Details Patient Name: Date of Service: [name] [name] 04/10/2021 1:00 PM Medical Record Number: [id number] Patient Account Number: [id number] Date of Birth/Sex: Treating RN: October 16, 1950 (70 y.o. [name] Primary Care Provider: [name] Other Clinician: Referring Provider: Treating Provider/Extender: [name] in Treatment: 8 Subjective Chief Complaint Information obtained from Patient Right great toe wound History of Present Illness (HPI) Admission 6/28 Mr. Elyas Aronhalt is a 70 year old male with a past medical history of uncontrolled insulin-dependent type 2 diabetes that presents to the wound care center for a 12-monthhistory of nonhealing wound to the right great toe. He states he has been following with podiatry for this issue. He is currently using antibiotic ointment to the area. He is wearing crocs today. He denies Increased warmth, erythema or purulent drainage from the wound. 7/8; patient presents for 1 week follow-up. He had his x-ray of his right foot done today. He has an offloading shoe we gave him at last clinic visit that has cracked. Overall he is doing well. he has no issues with dressing changes. He denies signs of infection. 7/15; patient presents for 1 week follow-up. He continues to use his offloading shoe daily. He reports improvement to the wound size. He has no issues with dressing changes and denies signs of infection. 7/25; patient presents for 1 week follow-up. He has no issues or complaints today. He has been using silver alginate and his offloading  shoe daily. He denies signs of infection. 8/1; patient presents for 1 week follow-up. He has no issues or complaints today. He continues to use silver alginate and his offloading shoe. He denies signs of infection. 8/16; patient presents for 2-week follow-up. He states that he developed another wound to his right great toe in the past week. He was vacuuming and thinks that the friction against his tennis shoe caused a blister. He popped it and states there was drainage with improvement to the wound site. He has been  currently keeping the area covered. He denies signs of infection. He does try to use his offloading shoe when he is not doing housework. 8/23; patient presents for 1 week follow-up. He continues to offload aggressively with his front offloading shoe and uses silver alginate. He denies any drainage. He denies signs of infection. Patient History Information obtained from Patient. Family History Cancer - Mother,Maternal Grandparents,Father, Hypertension - Mother, Lung Disease - Father, No family history of Diabetes, Heart Disease, Hereditary Spherocytosis, Kidney Disease, Seizures, Stroke, Thyroid Problems, Tuberculosis. Social History Former smoker - smokeless tobacoo, Marital Status - Single, Alcohol Use - Rarely - beer, Drug Use - No History, Caffeine Use - Daily - coffee. Medical History Eyes Denies history of Cataracts, Glaucoma, Optic Neuritis Ear/Nose/Mouth/Throat Denies history of Chronic sinus problems/congestion, Middle ear problems Hematologic/Lymphatic Denies history of Anemia, Hemophilia, Human Immunodeficiency Virus, Lymphedema, Sickle Cell Disease Respiratory Denies history of Aspiration, Asthma, Chronic Obstructive Pulmonary Disease (COPD), Pneumothorax, Sleep Apnea, Tuberculosis Cardiovascular Patient has history of Myocardial Infarction - 01/10/2020 Denies history of Arrhythmia, Congestive Heart Failure, Coronary Artery Disease, Deep Vein Thrombosis, Hypertension,  Hypotension, Peripheral Arterial Disease, Peripheral Venous Disease, Phlebitis, Vasculitis Gastrointestinal Denies history of Cirrhosis , Colitis, Crohnoos, Hepatitis A, Hepatitis B, Hepatitis C Endocrine Patient has history of Type II Diabetes Denies history of Type I Diabetes Genitourinary Denies history of End Stage Renal Disease Immunological Denies history of Lupus Erythematosus, Raynaudoos, Scleroderma Integumentary (Skin) Denies history of History of Burn Musculoskeletal Patient has history of Osteoarthritis Denies history of Gout, Rheumatoid Arthritis, Osteomyelitis Neurologic Denies history of Dementia, Neuropathy, Quadriplegia, Paraplegia, Seizure Disorder Oncologic Denies history of Received Chemotherapy, Received Radiation Psychiatric Denies history of Anorexia/bulimia, Confinement Anxiety Hospitalization/Surgery History - 01/09/2020 STEMI stent placement. Medical A Surgical History Notes nd Musculoskeletal carpel tunnel 2021 right wrist Oncologic Skin [name] 10/21 Objective Constitutional respirations regular, non-labored and within target range for patient.. Vitals Time Taken: 1:09 PM, Height: 73 in, Weight: 250 lbs, BMI: 33, Temperature: 98.6 F, Pulse: 80 bpm, Respiratory Rate: 20 breaths/min, Blood Pressure: 146/84 mmHg. Psychiatric pleasant and cooperative. General Notes: Epithelialization and callus to previous wound site. No drainage noted upon palpation. No signs of infection. Integumentary (Hair, Skin) Wound #1 status is Healed - Epithelialized. Original cause of wound was Blister. The date acquired was: 05/23/2020. The wound has been in treatment 8 weeks. The wound is located on the Right T Great. The wound measures 0cm length x 0cm width x 0cm depth; 0cm^2 area and 0cm^3 volume. There is no tunneling oe or undermining noted. There is a none present amount of drainage noted. The wound margin is well defined and not attached to the wound base. There is  no granulation within the wound bed. There is no necrotic tissue within the wound bed. Assessment Active Problems ICD-10 Type 2 diabetes mellitus with foot ulcer Non-pressure chronic ulcer of other part of right foot with unspecified severity Essential (primary) hypertension Patient presents with a closed wound to his right great toe. There is minimal callus to the previous wound site. No drainage noted when pressed upon. I do not think there is any underlying fluid collection. It appeared well-healing at last clinic visit. I did ask the patient to return if there is any issues including drainage or other open wounds. For now he can use Vaseline to the area. Plan Discharge From [facility] Services: Discharge from [place] healed!!! Additional Orders / Instructions: Other: - continue to keep right great toe protected with foam for the next  month 1. Vaseline 2. Discharge from our clinic due to closed wound 3. Follow-up as needed Electronic Signature(s) Signed: 04/10/2021 2:05:39 PM By: Kalman Shan DO Entered By: Kalman Shan on 04/10/2021 14:04:18 -------------------------------------------------------------------------------- HxROS Details Patient Name: Date of Service: [name] Dan Humphreys, Deontez L. 04/10/2021 1:00 PM Medical Record Number: [id number] Patient Account Number: [id number] Date of Birth/Sex: Treating RN: 12/26/1950 (71 y.o. [name] Primary Care Provider: [name] Other Clinician: Referring Provider: Treating Provider/Extender: [name] in Treatment: 8 Information Obtained From Patient Eyes Medical History: Negative for: Cataracts; Glaucoma; Optic Neuritis Ear/Nose/Mouth/Throat Medical History: Negative for: Chronic sinus problems/congestion; Middle ear problems Hematologic/Lymphatic Medical History: Negative for: Anemia; Hemophilia; Human Immunodeficiency Virus; Lymphedema; Sickle Cell  Disease Respiratory Medical History: Negative for: Aspiration; Asthma; Chronic Obstructive Pulmonary Disease (COPD); Pneumothorax; Sleep Apnea; Tuberculosis Cardiovascular Medical History: Positive for: Myocardial Infarction - 01/10/2020 Negative for: Arrhythmia; Congestive Heart Failure; Coronary Artery Disease; Deep Vein Thrombosis; Hypertension; Hypotension; Peripheral Arterial Disease; Peripheral Venous Disease; Phlebitis; Vasculitis Gastrointestinal Medical History: Negative for: Cirrhosis ; Colitis; Crohns; Hepatitis A; Hepatitis B; Hepatitis C Endocrine Medical History: Positive for: Type II Diabetes Negative for: Type I Diabetes Time with diabetes: dx 2013 Treated with: Oral agents, Diet Blood sugar tested every day: Yes Tested : daily Genitourinary Medical History: Negative for: End Stage Renal Disease Immunological Medical History: Negative for: Lupus Erythematosus; Raynauds; Scleroderma Integumentary (Skin) Medical History: Negative for: History of Burn Musculoskeletal Medical History: Positive for: Osteoarthritis Negative for: Gout; Rheumatoid Arthritis; Osteomyelitis Past Medical History Notes: carpel tunnel 2021 right wrist Neurologic Medical History: Negative for: Dementia; Neuropathy; Quadriplegia; Paraplegia; Seizure Disorder Oncologic Medical History: Negative for: Received Chemotherapy; Received Radiation Past Medical History Notes: Skin [name] 10/21 Psychiatric Medical History: Negative for: Anorexia/bulimia; Confinement Anxiety Immunizations Pneumococcal Vaccine: Received Pneumococcal Vaccination: Yes Received Pneumococcal Vaccination On or After 60th Birthday: No Implantable Devices No devices added Hospitalization / Surgery History Type of Hospitalization/Surgery 01/09/2020 STEMI stent placement Family and Social History Cancer: Yes - Mother,Maternal Grandparents,Father; Diabetes: No; Heart Disease: No; Hereditary Spherocytosis: No;  Hypertension: Yes - Mother; Kidney Disease: No; Lung Disease: Yes - Father; Seizures: No; Stroke: No; Thyroid Problems: No; Tuberculosis: No; Former smoker - smokeless tobacoo; Marital Status - Single; Alcohol Use: Rarely - beer; Drug Use: No History; Caffeine Use: Daily - coffee; Financial Concerns: No; Food, Clothing or Shelter Needs: No; Support System Lacking: No; Transportation Concerns: No Electronic Signature(s) Signed: 04/10/2021 2:05:39 PM By: Kalman Shan DO Signed: 04/10/2021 5:55:34 PM By: Levan Hurst RN, BSN Entered By: Kalman Shan on 04/10/2021 13:59:51 -------------------------------------------------------------------------------- [place] Details Patient Name: Date of Service: [name] 04/10/2021 Medical Record Number: [id number] Patient Account Number: [id number] Date of Birth/Sex: Treating RN: 06/01/1951 (70 y.o. [name] Primary Care Provider: [name] Other Clinician: Referring Provider: Treating Provider/Extender: [name] in Treatment: 8 Diagnosis Coding ICD-10 Codes Code Description [phone] Type 2 diabetes mellitus with foot ulcer L97.519 Non-pressure chronic ulcer of other part of right foot with unspecified severity I10 Essential (primary) hypertension Facility Procedures CPT4 Code: [id number] Description: 99213 - WOUND CARE VISIT-LEV 3 EST PT Modifier: Quantity: 1 Physician Procedures : CPT4 Code Description Modifier [id number] - WC PHYS LEVEL 3 - EST PT ICD-10 Diagnosis Description E11.621 Type 2 diabetes mellitus with foot ulcer L97.519 Non-pressure chronic ulcer of other part of right foot with unspecified severity I10 Essential  (primary) hypertension Quantity: 1 Electronic Signature(s) Signed: 04/10/2021 2:05:39 PM By: Kalman Shan DO Entered By: Heber [place],  Elyana Grabski on 04/10/2021 14:04:46

## 2021-04-11 NOTE — Progress Notes (Signed)
Hammill, Demetrias L. ([id number]) Visit Report for 04/10/2021 Arrival Information Details Patient Name: Date of Service: [name], [name] 04/10/2021 1:00 PM Medical Record Number: [id number] Patient Account Number: [id number] Date of Birth/Sex: Treating RN: 11/06/1950 (70 y.o. [name] Primary Care Antania Hoefling: [name] Other Clinician: Referring Majesti Gambrell: Treating Climmie Buelow/Extender: [name] in Treatment: 8 Visit Information History Since Last Visit Added or deleted any medications: No Patient Arrived: Ambulatory Any new allergies or adverse reactions: No Arrival Time: 13:06 Had a fall or experienced change in No Accompanied By: self activities of daily living that may affect Transfer Assistance: None risk of falls: Patient Identification Verified: Yes Signs or symptoms of abuse/neglect since last visito No Secondary Verification Process Completed: Yes Hospitalized since last visit: No Patient Requires Transmission-Based Precautions: No Implantable device outside of the clinic excluding No Patient Has Alerts: No cellular tissue based products placed in the center since last visit: Has Dressing in Place as Prescribed: Yes Pain Present Now: No Electronic Signature(s) Signed: 04/11/2021 9:12:52 AM By: Sandre Kitty Entered By: Sandre Kitty on 04/10/2021 13:09:12 -------------------------------------------------------------------------------- Clinic Level of Care Assessment Details Patient Name: Date of Service: [name], [name] 04/10/2021 1:00 PM Medical Record Number: [id number] Patient Account Number: [id number] Date of Birth/Sex: Treating RN: 1950-12-18 (70 y.o. [name] Primary Care Shiro Ellerman: [name] Other Clinician: Referring Jaser Fullen: Treating Cosby Proby/Extender: [name] in Treatment: 8 Clinic Level of Care Assessment Items TOOL 4 Quantity Score X- 1 0 Use when only an EandM is  performed on FOLLOW-UP visit ASSESSMENTS - Nursing Assessment / Reassessment X- 1 10 Reassessment of Co-morbidities (includes updates in patient status) X- 1 5 Reassessment of Adherence to Treatment Plan ASSESSMENTS - Wound and Skin A ssessment / Reassessment X - Simple Wound Assessment / Reassessment - one wound 1 5 '[]'$  - 0 Complex Wound Assessment / Reassessment - multiple wounds '[]'$  - 0 Dermatologic / Skin Assessment (not related to wound area) ASSESSMENTS - Focused Assessment '[]'$  - 0 Circumferential Edema Measurements - multi extremities '[]'$  - 0 Nutritional Assessment / Counseling / Intervention X- 1 5 Lower Extremity Assessment (monofilament, tuning fork, pulses) '[]'$  - 0 Peripheral Arterial Disease Assessment (using hand held doppler) ASSESSMENTS - Ostomy and/or Continence Assessment and Care '[]'$  - 0 Incontinence Assessment and Management '[]'$  - 0 Ostomy Care Assessment and Management (repouching, etc.) PROCESS - Coordination of Care X - Simple Patient / Family Education for ongoing care 1 15 '[]'$  - 0 Complex (extensive) Patient / Family Education for ongoing care X- 1 10 Staff obtains Programmer, systems, Records, T Results / Process Orders est '[]'$  - 0 Staff telephones HHA, Nursing Homes / Clarify orders / etc '[]'$  - 0 Routine Transfer to another Facility (non-emergent condition) '[]'$  - 0 Routine Hospital Admission (non-emergent condition) '[]'$  - 0 New Admissions / Biomedical engineer / Ordering NPWT Apligraf, etc. , '[]'$  - 0 Emergency Hospital Admission (emergent condition) X- 1 10 Simple Discharge Coordination '[]'$  - 0 Complex (extensive) Discharge Coordination PROCESS - Special Needs '[]'$  - 0 Pediatric / Minor Patient Management '[]'$  - 0 Isolation Patient Management '[]'$  - 0 Hearing / Language / Visual special needs '[]'$  - 0 Assessment of Community assistance (transportation, D/C planning, etc.) '[]'$  - 0 Additional assistance / Altered mentation '[]'$  - 0 Support Surface(s) Assessment  (bed, cushion, seat, etc.) INTERVENTIONS - Wound Cleansing / Measurement X - Simple Wound Cleansing - one wound 1 5 '[]'$  - 0 Complex Wound Cleansing - multiple wounds  X- 1 5 Wound Imaging (photographs - any number of wounds) '[]'$  - 0 Wound Tracing (instead of photographs) X- 1 5 Simple Wound Measurement - one wound '[]'$  - 0 Complex Wound Measurement - multiple wounds INTERVENTIONS - Wound Dressings '[]'$  - 0 Small Wound Dressing one or multiple wounds '[]'$  - 0 Medium Wound Dressing one or multiple wounds '[]'$  - 0 Large Wound Dressing one or multiple wounds '[]'$  - 0 Application of Medications - topical '[]'$  - 0 Application of Medications - injection INTERVENTIONS - Miscellaneous '[]'$  - 0 External ear exam '[]'$  - 0 Specimen Collection (cultures, biopsies, blood, body fluids, etc.) '[]'$  - 0 Specimen(s) / Culture(s) sent or taken to Lab for analysis '[]'$  - 0 Patient Transfer (multiple staff / Harrel Lemon Lift / Similar devices) '[]'$  - 0 Simple Staple / Suture removal (25 or less) '[]'$  - 0 Complex Staple / Suture removal (26 or more) '[]'$  - 0 Hypo / Hyperglycemic Management (close monitor of Blood Glucose) '[]'$  - 0 Ankle / Brachial Index (ABI) - do not check if billed separately X- 1 5 Vital Signs Has the patient been seen at the hospital within the last three years: Yes Total Score: 80 Level Of Care: New/Established - Level 3 Electronic Signature(s) Signed: 04/10/2021 5:55:34 PM By: Levan Hurst RN, BSN Entered By: Levan Hurst on 04/10/2021 13:33:54 -------------------------------------------------------------------------------- Encounter Discharge Information Details Patient Name: Date of Service: [name] [name] Mohair L. 04/10/2021 1:00 PM Medical Record Number: [id number] Patient Account Number: [id number] Date of Birth/Sex: Treating RN: 12-02-1950 (70 y.o. [name] Primary Care Neyland Pettengill: [name] Other Clinician: Referring Terrisa Curfman: Treating Jadee Golebiewski/Extender: [name] in Treatment: 8 Encounter Discharge Information Items Discharge Condition: Stable Ambulatory Status: Ambulatory Discharge Destination: Home Transportation: Private Auto Accompanied By: alone Schedule Follow-up Appointment: Yes Clinical Summary of Care: Patient Declined Electronic Signature(s) Signed: 04/10/2021 5:55:34 PM By: Levan Hurst RN, BSN Entered By: Levan Hurst on 04/10/2021 13:34:24 -------------------------------------------------------------------------------- Lower Extremity Assessment Details Patient Name: Date of Service: [name]. 04/10/2021 1:00 PM Medical Record Number: [id number] Patient Account Number: [id number] Date of Birth/Sex: Treating RN: July 17, 1951 (70 y.o. [name] Primary Care Tonita Bills: [name] Other Clinician: Referring Autry Prust: Treating Sima Lindenberger/Extender: [name] in Treatment: 8 Edema Assessment Assessed: Shirlyn Goltz: No] [Right: No] Edema: [Left: Green] [Right: o] Calf Left: Right: Point of Measurement: 32 cm From Medial Instep 44 cm Ankle Left: Right: Point of Measurement: 11 cm From Medial Instep 24 cm Vascular Assessment Pulses: Dorsalis Pedis Palpable: [Right:Yes] Electronic Signature(s) Signed: 04/10/2021 5:55:34 PM By: Levan Hurst RN, BSN Entered By: Levan Hurst on 04/10/2021 13:31:33 -------------------------------------------------------------------------------- Multi Wound Chart Details Patient Name: Date of Service: [name] [name] Mohair L. 04/10/2021 1:00 PM Medical Record Number: [id number] Patient Account Number: [id number] Date of Birth/Sex: Treating RN: Jun 14, 1951 (70 y.o. [name] Primary Care Eshika Reckart: [name] Other Clinician: Referring Meril Dray: Treating Minas Bonser/Extender: [name] in Treatment: 8 Vital Signs Height(in): 73 Pulse(bpm): 80 Weight(lbs): 250 Blood Pressure(mmHg): 146/84 Body Mass Index(BMI):  33 Temperature(F): 98.6 Respiratory Rate(breaths/min): 20 Photos: [Green/A:Green/A] Right T Great oe Green/A Green/A Wound Location: Blister Green/A Green/A Wounding Event: Diabetic Wound/Ulcer of the Lower Green/A Green/A Primary Etiology: Extremity Myocardial Infarction, Type II Green/A Green/A Comorbid History: Diabetes, Osteoarthritis 05/23/2020 Green/A Green/A Date Acquired: 8 Green/A Green/A Weeks of Treatment: Healed - Epithelialized Green/A Green/A Wound Status: 0x0x0 Green/A Green/A Measurements L x W x D (cm) 0 Green/A Green/A A (cm) : rea 0  Green/A Green/A Volume (cm) : 100.00% Green/A Green/A % Reduction in A rea: 100.00% Green/A Green/A % Reduction in Volume: Grade 2 Green/A Green/A Classification: None Present Green/A Green/A Exudate A mount: Well defined, not attached Green/A Green/A Wound Margin: None Present (0%) Green/A Green/A Granulation A mount: None Present (0%) Green/A Green/A Necrotic A mount: Fascia: No Green/A Green/A Exposed Structures: Fat Layer (Subcutaneous Tissue): No Tendon: No Muscle: No Joint: No Bone: No Large (67-100%) Green/A Green/A Epithelialization: Treatment Notes Electronic Signature(s) Signed: 04/10/2021 2:05:39 PM By: Kalman Shan DO Signed: 04/10/2021 5:55:34 PM By: Levan Hurst RN, BSN Entered By: Kalman Shan on 04/10/2021 13:58:52 -------------------------------------------------------------------------------- Multi-Disciplinary Care Plan Details Patient Name: Date of Service: [name] [name] Mohair L. 04/10/2021 1:00 PM Medical Record Number: [id number] Patient Account Number: [id number] Date of Birth/Sex: Treating RN: 12/04/1950 (70 y.o. [name] Primary Care Citlaly Camplin: [name] Other Clinician: Referring Shahid Flori: Treating Lavonta Tillis/Extender: [name] in Treatment: 8 Active Inactive Electronic Signature(s) Signed: 04/10/2021 5:55:34 PM By: Levan Hurst RN, BSN Entered By: Levan Hurst on 04/10/2021 13:34:52 -------------------------------------------------------------------------------- Pain Assessment  Details Patient Name: Date of Service: [name] [name] Mohair L. 04/10/2021 1:00 PM Medical Record Number: [id number] Patient Account Number: [id number] Date of Birth/Sex: Treating RN: 04/24/1951 (70 y.o. [name] Primary Care Amiyrah Lamere: [name] Other Clinician: Referring Shriyan Arakawa: Treating Zoeya Gramajo/Extender: [name] in Treatment: 8 Active Problems Location of Pain Severity and Description of Pain Patient Has Paino No Site Locations Pain Management and Medication Current Pain Management: Electronic Signature(s) Signed: 04/10/2021 5:55:34 PM By: Levan Hurst RN, BSN Signed: 04/11/2021 9:12:52 AM By: Sandre Kitty Entered By: Sandre Kitty on 04/10/2021 13:09:33 -------------------------------------------------------------------------------- Patient/Caregiver Education Details Patient Name: Date of Service: [name] 8/23/2022andnbsp1:00 PM Medical Record Number: [id number] Patient Account Number: [id number] Date of Birth/Gender: Treating RN: [birth year] (70 y.o. [name] Primary Care Physician: [name] Other Clinician: Referring Physician: Treating Physician/Extender: [name] in Treatment: 8 Education Assessment Education Provided To: Patient Education Topics Provided Wound/Skin Impairment: Methods: Explain/Verbal Responses: State content correctly [employer]) Signed: 04/10/2021 5:55:34 PM By: Levan Hurst RN, BSN Entered By: Levan Hurst on 04/10/2021 13:06:01 -------------------------------------------------------------------------------- Wound Assessment Details Patient Name: Date of Service: [name] [name] Mohair L. 04/10/2021 1:00 PM Medical Record Number: [id number] Patient Account Number: [id number] Date of Birth/Sex: Treating RN: 1950-10-09 (70 y.o. [name] Primary Care Miguelangel Korn: [name] Other Clinician: Referring Athea Haley: Treating  Amman Bartel/Extender: [name] in Treatment: 8 Wound Status Wound Number: 1 Primary Etiology: Diabetic Wound/Ulcer of the Lower Extremity Wound Location: Right T Great oe Wound Status: Healed - Epithelialized Wounding Event: Blister Comorbid History: Myocardial Infarction, Type II Diabetes, Osteoarthritis Date Acquired: 05/23/2020 Weeks Of Treatment: 8 Clustered Wound: No Photos Wound Measurements Length: (cm) Width: (cm) Depth: (cm) Area: (cm) Volume: (cm) 0 % Reduction in Area: 100% 0 % Reduction in Volume: 100% 0 Epithelialization: Large (67-100%) 0 Tunneling: No 0 Undermining: No Wound Description Classification: Grade 2 Wound Margin: Well defined, not attached Exudate Amount: None Present Foul Odor After Cleansing: No Slough/Fibrino No Wound Bed Granulation Amount: None Present (0%) Exposed Structure Necrotic Amount: None Present (0%) Fascia Exposed: No Fat Layer (Subcutaneous Tissue) Exposed: No Tendon Exposed: No Muscle Exposed: No Joint Exposed: No Bone Exposed: No Electronic Signature(s) Signed: 04/10/2021 5:55:34 PM By: Levan Hurst RN, BSN Entered By: Levan Hurst on 04/10/2021 13:17:17 -------------------------------------------------------------------------------- Vitals Details Patient Name: Date of Service: [name], [name]  L. 04/10/2021 1:00 PM Medical Record Number: [id number] Patient Account Number: [id number] Date of Birth/Sex: Treating RN: March 20, 1951 (70 y.o. [name] Primary Care Dyna Figuereo: [name] Other Clinician: Referring Marylan Glore: Treating Vashon Riordan/Extender: [name] in Treatment: 8 Vital Signs Time Taken: 13:09 Temperature (F): 98.6 Height (in): 73 Pulse (bpm): 80 Weight (lbs): 250 Respiratory Rate (breaths/min): 20 Body Mass Index (BMI): 33 Blood Pressure (mmHg): 146/84 Reference Range: 80 - 120 mg / dl Electronic Signature(s) Signed: 04/11/2021  9:12:52 AM By: Sandre Kitty Entered By: Sandre Kitty on 04/10/2021 13:09:27

## 2021-06-08 DIAGNOSIS — E1169 Type 2 diabetes mellitus with other specified complication: Secondary | ICD-10-CM | POA: Diagnosis not present

## 2021-06-08 DIAGNOSIS — I2510 Atherosclerotic heart disease of native coronary artery without angina pectoris: Secondary | ICD-10-CM | POA: Diagnosis not present

## 2021-06-08 DIAGNOSIS — Z85828 Personal history of other malignant neoplasm of skin: Secondary | ICD-10-CM | POA: Diagnosis not present

## 2021-06-08 DIAGNOSIS — Z0000 Encounter for general adult medical examination without abnormal findings: Secondary | ICD-10-CM | POA: Diagnosis not present

## 2021-06-08 DIAGNOSIS — I10 Essential (primary) hypertension: Secondary | ICD-10-CM | POA: Diagnosis not present

## 2021-06-08 DIAGNOSIS — T466X5A Adverse effect of antihyperlipidemic and antiarteriosclerotic drugs, initial encounter: Secondary | ICD-10-CM | POA: Diagnosis not present

## 2021-06-08 DIAGNOSIS — Z23 Encounter for immunization: Secondary | ICD-10-CM | POA: Diagnosis not present

## 2021-06-08 DIAGNOSIS — Z1211 Encounter for screening for malignant neoplasm of colon: Secondary | ICD-10-CM | POA: Diagnosis not present

## 2021-06-08 DIAGNOSIS — E7800 Pure hypercholesterolemia, unspecified: Secondary | ICD-10-CM | POA: Diagnosis not present

## 2021-07-11 DIAGNOSIS — D1801 Hemangioma of skin and subcutaneous tissue: Secondary | ICD-10-CM | POA: Diagnosis not present

## 2021-07-11 DIAGNOSIS — L814 Other melanin hyperpigmentation: Secondary | ICD-10-CM | POA: Diagnosis not present

## 2021-07-11 DIAGNOSIS — L570 Actinic keratosis: Secondary | ICD-10-CM | POA: Diagnosis not present

## 2021-07-11 DIAGNOSIS — L821 Other seborrheic keratosis: Secondary | ICD-10-CM | POA: Diagnosis not present

## 2021-07-17 ENCOUNTER — Encounter: Payer: Self-pay | Admitting: Podiatry

## 2021-07-17 ENCOUNTER — Other Ambulatory Visit: Payer: Self-pay

## 2021-07-17 ENCOUNTER — Ambulatory Visit: Payer: HMO

## 2021-07-17 ENCOUNTER — Ambulatory Visit: Payer: HMO | Admitting: Podiatry

## 2021-07-17 DIAGNOSIS — E1142 Type 2 diabetes mellitus with diabetic polyneuropathy: Secondary | ICD-10-CM

## 2021-07-17 DIAGNOSIS — L84 Corns and callosities: Secondary | ICD-10-CM

## 2021-07-17 DIAGNOSIS — E1140 Type 2 diabetes mellitus with diabetic neuropathy, unspecified: Secondary | ICD-10-CM | POA: Insufficient documentation

## 2021-07-17 DIAGNOSIS — M79675 Pain in left toe(s): Secondary | ICD-10-CM | POA: Diagnosis not present

## 2021-07-17 DIAGNOSIS — M79674 Pain in right toe(s): Secondary | ICD-10-CM | POA: Diagnosis not present

## 2021-07-17 DIAGNOSIS — B351 Tinea unguium: Secondary | ICD-10-CM | POA: Diagnosis not present

## 2021-07-17 NOTE — Progress Notes (Signed)
This patient returns to my office for at risk foot care.  This patient requires this care by a professional since this patient will be at risk due to having diabetes type 2. and CKD.   He says he has history of diabetic ulcer on his right big toe.  He says he was seen by Dr.  Margaret Pyle and wound center.  He said his ulcer closed and was closed for months.  He says last week the callus/ulcer drained.  He then started wearing orthowedge shoe and the ulcer has closed.  He presents to the office for preventative foot care services.  This patient is unable to cut nails himself since the patient cannot reach his nails.These nails are painful walking and wearing shoes.  This patient presents for at risk foot care today.  General Appearance  Alert, conversant and in no acute stress.  Vascular  Dorsalis pedis and posterior tibial  pulses are palpable  bilaterally.  Capillary return is within normal limits  bilaterally. Temperature is within normal limits  bilaterally.  Neurologic  Senn-Weinstein monofilament wire test diminished/absent  bilaterally. Muscle power within normal limits bilaterally.  Nails Thick disfigured discolored nails with subungual debris  from hallux to fifth toes bilaterally. No evidence of bacterial infection or drainage bilaterally.  Orthopedic  No limitations of motion  feet .  No crepitus or effusions noted.  No bony pathology or digital deformities noted. Hallux limitus 1st MPJ  B/L.  Limited ROM noted.   Skin  normotropic skin with no porokeratosis noted bilaterally.  Closed callus /ulcer right hallux at the plantar medial aspect IPJ right hallux.   Onychomycosis  Pain in right toes  Pain in left toes  Callus/ulcer right hallux.  Diabetes with neuropathy.    Consent was obtained for treatment procedures.   Mechanical debridement of nails 1-5  bilaterally performed with a nail nipper.  Filed with dremel without incident. Debridement of callus/ulcer with # 15 blade followed by dremel  tool usage.  Contacted Aaron Edelman who made an off weight-bearing insole to be worn in new ortho-wedge shoes.  If ulcer persists he should be reappointed with Dr.  Posey Pronto.  Diabetic foot exam performed reveal vascular status intact but diminished LOPS findings.     Return office visit   3 months for nail care.                   Told patient to return for periodic foot care and evaluation due to potential at risk complications.   Gardiner Barefoot DPM

## 2021-07-18 NOTE — Progress Notes (Signed)
SITUATION Reason for Consult: Follow-up with Orthowedge wound healing sandal Patient / Caregiver Report: Patient received sandal from [facility] and needs additional offloading  OBJECTIVE DATA History / Diagnosis: Pre-ulcerative calluses  Diabetic polyneuropathy associated with type 2 diabetes mellitus ([place])  Change in Pathology: None  ACTIONS PERFORMED Patient's equipment was checked for structural stability and fit. Provided replacement sandal due to structural failure of existing device. Added customized offloading for callous on right hallux. Device(s) intact and fit is excellent. All questions answered and concerns addressed.  PLAN Follow-up as needed (PRN). Plan of care discussed with and agreed upon by patient / caregiver.

## 2021-09-21 DIAGNOSIS — E1169 Type 2 diabetes mellitus with other specified complication: Secondary | ICD-10-CM | POA: Diagnosis not present

## 2021-09-21 DIAGNOSIS — I2510 Atherosclerotic heart disease of native coronary artery without angina pectoris: Secondary | ICD-10-CM | POA: Diagnosis not present

## 2021-09-21 DIAGNOSIS — T50905A Adverse effect of unspecified drugs, medicaments and biological substances, initial encounter: Secondary | ICD-10-CM | POA: Diagnosis not present

## 2021-09-21 DIAGNOSIS — M609 Myositis, unspecified: Secondary | ICD-10-CM | POA: Diagnosis not present

## 2021-09-21 DIAGNOSIS — I10 Essential (primary) hypertension: Secondary | ICD-10-CM | POA: Diagnosis not present

## 2021-09-21 DIAGNOSIS — E7800 Pure hypercholesterolemia, unspecified: Secondary | ICD-10-CM | POA: Diagnosis not present

## 2021-09-22 NOTE — Progress Notes (Signed)
Cardiology Office Note   Date:  09/24/2021   ID:  [name], DOB July 29, 1951, MRN [id number]  PCP:  [name], MD  Cardiologist:  Shavana Calder [name], MD EP: None  Chief Complaint  Patient presents with   Coronary Artery Disease      History of Present Illness: [name] is a 71 y.o. male with a PMH of CAD s/p STEMI 12/2019 managed with PCI/DES to LAD, chronic combined CHF/ICM with recovery of EF, HTN, HLD, DM type 2, and obesity,  who presents for follow-up  In Jan 2022 he was working with podiatry for management of a large ulcer on his right great toe; LE A dopplers were negative for PAD. He was encouraged to restart his metformin, otherwise no medication changes occurred. He was recommended to follow-up in 6 months with plans to discontinue brilinta 12/2020 if no changes to cardiac history. His last echocardiogram 04/2020 showed EF 50-55%, ongoing RWMA with mid-anteroseptal and mid-inferoseptal hypokinesis, G1DD, no significant valvular abnormalities, and mildly dilated aortic root (36mm).   He does have recurrent ulcer on right great toe. Followed at wound care center.   He denies any chest pain or SOB. Feels a little dizzy at times. No nicotine products.     Past Medical History:  Diagnosis Date   Allergy    Bilateral carpal tunnel syndrome 10/19/2019   Chronic kidney disease    Delusional disorder ([place])    Diabetes type 2, uncontrolled 03/26/2017    Past Surgical History:  Procedure Laterality Date   CORONARY STENT INTERVENTION N/A 01/10/2020   Procedure: CORONARY STENT INTERVENTION;  Surgeon: [name], Jandi Swiger M, MD;  Location: [facility];  Service: Cardiovascular;  Laterality: N/A;   CORONARY/GRAFT ACUTE MI REVASCULARIZATION N/A 01/10/2020   Procedure: Coronary/Graft Acute MI Revascularization;  Surgeon: [name], Corban Kistler M, MD;  Location: [facility];  Service: Cardiovascular;  Laterality: N/A;   KNEE SURGERY Left    LEFT HEART CATH AND CORONARY ANGIOGRAPHY  N/A 01/10/2020   Procedure: LEFT HEART CATH AND CORONARY ANGIOGRAPHY;  Surgeon: [name], Dorrine Montone M, MD;  Location: [facility];  Service: Cardiovascular;  Laterality: N/A;   TONSILLECTOMY Bilateral      Current Outpatient Medications  Medication Sig Dispense Refill   aspirin 81 MG chewable tablet Chew 1 tablet (81 mg total) by mouth daily. 30 tablet 11   carvedilol (COREG) 6.25 MG tablet Take 1 tablet (6.25 mg total) by mouth 2 (two) times daily with a meal. 60 tablet 11   JARDIANCE 25 MG TABS tablet Take 25 mg by mouth daily.     ketoconazole (NIZORAL) 2 % cream ketoconazole 2 % topical cream     losartan (COZAAR) 25 MG tablet Take 1 tablet (25 mg total) by mouth daily. 30 tablet 11   metFORMIN (GLUCOPHAGE) 500 MG tablet Take 1 tablet (500 mg total) by mouth 2 (two) times daily with a meal. (Patient taking differently: Take 1,000 mg by mouth daily with breakfast.)     Multiple Vitamin (MULTIVITAMIN) tablet Take 1 tablet by mouth daily.     nitroGLYCERIN (NITROSTAT) 0.4 MG SL tablet Place 1 tablet (0.4 mg total) under the tongue every 5 (five) minutes x 3 doses as needed for chest pain. 25 tablet 1   Psyllium (METAMUCIL FIBER PO) Take by mouth.     rosuvastatin (CRESTOR) 20 MG tablet Take 20 mg by mouth daily.     No current facility-administered medications for this visit.  Allergies:   Achromycin [tetracycline] and Januvia [sitagliptin]    Social History:  The patient  reports that he has never smoked. He has never used smokeless tobacco. He reports that he does not drink alcohol and does not use drugs.   Family History:  The patient's family history includes Hypertension in his father.    ROS:  Please see the history of present illness.   Otherwise, review of systems are positive for none.   All other systems are reviewed and negative.    PHYSICAL EXAM: VS:  BP 129/82    Pulse 74    Ht 6' (1.829 m)    Wt 265 lb 9.6 oz (120.5 kg)    SpO2 98%    BMI 36.02 kg/m  , BMI Body  mass index is 36.02 kg/m. GEN: Well nourished, well developed, in no acute distress HEENT: sclera anicteric Neck: no JVD, carotid bruits, or masses Cardiac: RRR; no murmurs, rubs, or gallops, trace LE edema  Respiratory:  clear to auscultation bilaterally, normal work of breathing GI: soft, nontender, nondistended, + BS MS: no deformity or atrophy, ulcer on right great toe.  Skin: warm and dry, no rash Neuro:  Strength and sensation are intact Psych: euthymic mood, full affect   EKG:  EKG is ordered today. The ekg ordered today demonstrates sinus rhythm, rate 79 bpm, no STE/D, no TWI, no significant change from previous.    Recent Labs: No results found for requested labs within last 8760 hours.    Lipid Panel    Component Value Date/Time   CHOL 113 03/03/2020 0902   TRIG 90 03/03/2020 0902   HDL 41 03/03/2020 0902   CHOLHDL 2.8 03/03/2020 0902   CHOLHDL 4.1 01/10/2020 0000   VLDL 19 01/10/2020 0000   LDLCALC 54 03/03/2020 0902    Dated 06/08/21: cholesterol 134, triglycerides 86, HDL 41, LDL 76. A1c 8.8%. glucose 206. Otherwise CMET normal.  Wt Readings from Last 3 Encounters:  09/24/21 265 lb 9.6 oz (120.5 kg)  03/29/21 261 lb 12.8 oz (118.8 kg)  08/31/20 255 lb (115.7 kg)      Other studies Reviewed: Additional studies/ records that were reviewed today include:   Echocardiogram 04/2020:  1. Left ventricular ejection fraction, by estimation, is 50 to 55%. The  left ventricle has low normal function. The left ventricle demonstrates  regional wall motion abnormalities with mid anteroseptal and mid  inferoseptal hypokinesis. Left ventricular  diastolic parameters are consistent with Grade I diastolic dysfunction  (impaired relaxation).   2. Right ventricular systolic function is normal. The right ventricular  size is normal. Tricuspid regurgitation signal is inadequate for assessing  PA pressure.   3. The mitral valve is normal in structure. Trivial mitral valve   regurgitation. No evidence of mitral stenosis.   4. The aortic valve is tricuspid. Aortic valve regurgitation is trivial.  No aortic stenosis is present.   5. Aortic dilatation noted. There is mild dilatation of the aortic root  measuring 38 mm.   6. The inferior vena cava is normal in size with greater than 50%  respiratory variability, suggesting right atrial pressure of 3 mmHg.   LHC 12/2019:  - Prox LAD to Mid LAD lesion is 100% stenosed.  - 1st Diag lesion is 75% stenosed.  - Post intervention, there is a 0% residual stenosis.  - A drug-eluting stent was successfully placed using a SYNERGY XD 3.50X20.  - LV end diastolic pressure is normal.     1. Single vessel  occlusive CAD. 100% proximal to mid LAD. 75% first diagonal  2. Normal LVEDP 15 mm Hg  3. Successful PCI of the LAD with DES x 1     Plan: DAPT for one year. Risk factor modification. Will assess LV function with Echo.   Echocardiogram 12/2019:  1. Left ventricular ejection fraction, by estimation, is 40 to 45%. The  left ventricle has mildly decreased function. The left ventricle has no  regional wall motion abnormalities. There is moderate concentric left  ventricular hypertrophy. Left  ventricular diastolic parameters are consistent with Grade I diastolic  dysfunction (impaired relaxation).   2. Right ventricular systolic function is normal. The right ventricular  size is normal.   3. The mitral valve is grossly normal. No evidence of mitral valve  regurgitation.   4. The aortic valve is normal in structure. Aortic valve regurgitation is  not visualized. No aortic stenosis is present.   5. IVC measures 2.53cm.      ASSESSMENT AND PLAN:  1. CAD s/p STEMI 12/2019 with PCI/DES to LAD: had residual medically managed 75% D1 stenosis, otherwise normal coronaries. No anginal complaints.  - Continue aspirin and statin  - Continue BBlocker   2. Chronic combined CHF/ICM: subsequent recovery of EF from 40-45% 12/2019 up  to 50-55% 04/2020. No volume overload complaints and appears euvolemic on exam  - Continue carvedilol and losartan  3. HTN: BP well controlled.  - Continue carvedilol and losartan   4. HLD:  - Continue crestor   5. DM type 2: A1C 9.4% per patient report - Continue metformin  - continue Jardiance. - try and lose weight.    Current medicines are reviewed at length with the patient today.  The patient does not have concerns regarding medicines.  The following changes have been made:  As above  Labs/ tests ordered today include:   No orders of the defined types were placed in this encounter.    Disposition:   FU with APP in 6 months  Signed, Miel Wisener [name], MD  09/24/2021 3:26 PM

## 2021-09-24 ENCOUNTER — Ambulatory Visit: Payer: HMO | Admitting: Cardiology

## 2021-09-24 ENCOUNTER — Encounter: Payer: Self-pay | Admitting: Cardiology

## 2021-09-24 ENCOUNTER — Other Ambulatory Visit: Payer: Self-pay

## 2021-09-24 VITALS — BP 129/82 | HR 74 | Ht 72.0 in | Wt 265.6 lb

## 2021-09-24 DIAGNOSIS — I5042 Chronic combined systolic (congestive) and diastolic (congestive) heart failure: Secondary | ICD-10-CM

## 2021-09-24 DIAGNOSIS — I10 Essential (primary) hypertension: Secondary | ICD-10-CM | POA: Diagnosis not present

## 2021-09-24 DIAGNOSIS — E7800 Pure hypercholesterolemia, unspecified: Secondary | ICD-10-CM | POA: Diagnosis not present

## 2021-09-24 DIAGNOSIS — I2510 Atherosclerotic heart disease of native coronary artery without angina pectoris: Secondary | ICD-10-CM | POA: Diagnosis not present

## 2021-10-02 DIAGNOSIS — I10 Essential (primary) hypertension: Secondary | ICD-10-CM | POA: Diagnosis not present

## 2021-10-02 DIAGNOSIS — E7800 Pure hypercholesterolemia, unspecified: Secondary | ICD-10-CM | POA: Diagnosis not present

## 2021-10-02 DIAGNOSIS — E1169 Type 2 diabetes mellitus with other specified complication: Secondary | ICD-10-CM | POA: Diagnosis not present

## 2021-12-21 DIAGNOSIS — M609 Myositis, unspecified: Secondary | ICD-10-CM | POA: Diagnosis not present

## 2021-12-21 DIAGNOSIS — Z23 Encounter for immunization: Secondary | ICD-10-CM | POA: Diagnosis not present

## 2021-12-21 DIAGNOSIS — I10 Essential (primary) hypertension: Secondary | ICD-10-CM | POA: Diagnosis not present

## 2021-12-21 DIAGNOSIS — Z85828 Personal history of other malignant neoplasm of skin: Secondary | ICD-10-CM | POA: Diagnosis not present

## 2021-12-21 DIAGNOSIS — E1169 Type 2 diabetes mellitus with other specified complication: Secondary | ICD-10-CM | POA: Diagnosis not present

## 2021-12-21 DIAGNOSIS — Z79899 Other long term (current) drug therapy: Secondary | ICD-10-CM | POA: Diagnosis not present

## 2021-12-21 DIAGNOSIS — T466X5A Adverse effect of antihyperlipidemic and antiarteriosclerotic drugs, initial encounter: Secondary | ICD-10-CM | POA: Diagnosis not present

## 2021-12-21 DIAGNOSIS — L97511 Non-pressure chronic ulcer of other part of right foot limited to breakdown of skin: Secondary | ICD-10-CM | POA: Diagnosis not present

## 2021-12-21 DIAGNOSIS — I2510 Atherosclerotic heart disease of native coronary artery without angina pectoris: Secondary | ICD-10-CM | POA: Diagnosis not present

## 2021-12-21 DIAGNOSIS — E11621 Type 2 diabetes mellitus with foot ulcer: Secondary | ICD-10-CM | POA: Diagnosis not present

## 2021-12-31 ENCOUNTER — Ambulatory Visit (INDEPENDENT_AMBULATORY_CARE_PROVIDER_SITE_OTHER): Payer: HMO | Admitting: Podiatry

## 2021-12-31 ENCOUNTER — Encounter: Payer: Self-pay | Admitting: Podiatry

## 2021-12-31 DIAGNOSIS — E1142 Type 2 diabetes mellitus with diabetic polyneuropathy: Secondary | ICD-10-CM | POA: Diagnosis not present

## 2021-12-31 DIAGNOSIS — M79675 Pain in left toe(s): Secondary | ICD-10-CM | POA: Diagnosis not present

## 2021-12-31 DIAGNOSIS — M79674 Pain in right toe(s): Secondary | ICD-10-CM | POA: Diagnosis not present

## 2021-12-31 DIAGNOSIS — B351 Tinea unguium: Secondary | ICD-10-CM | POA: Diagnosis not present

## 2021-12-31 DIAGNOSIS — L84 Corns and callosities: Secondary | ICD-10-CM

## 2022-01-02 NOTE — Progress Notes (Signed)
Subjective:  ? ?Patient ID: [name], male   DOB: 72 y.o.   MRN: [id number]  ? ?HPI ?Patient presents stating he is concerned about this lesion on the big toe of his right foot stating that he has had history of ulceration it is thick with discoloration and painful along with nail disease 1-5 both feet that are yellow brittle and can become painful. ? ? ?ROS ? ? ?   ?Objective:  ?Physical Exam  ?Neurovascular status found to be intact with the patient found to have nail disease 1-5 both feet that are thick yellow brittle and become painful and he cannot take care of with high risk factors with long-term diabetes and chronic lesion right history of ulceration of it today it is just thickened and discolored ? ?   ?Assessment:  ?At risk patient with mycotic nail infection 1-5 both feet and lesion right painful ? ?   ?Plan:  ?H&P reviewed both conditions and today I went ahead and I did debridement of lesion right no breakdown of tissue associated with it and I debrided nailbeds 1-5 both feet no iatrogenic bleeding reappoint routine care ?   ? ? ?

## 2022-01-17 DIAGNOSIS — D225 Melanocytic nevi of trunk: Secondary | ICD-10-CM | POA: Diagnosis not present

## 2022-01-17 DIAGNOSIS — L821 Other seborrheic keratosis: Secondary | ICD-10-CM | POA: Diagnosis not present

## 2022-01-17 DIAGNOSIS — S80861A Insect bite (nonvenomous), right lower leg, initial encounter: Secondary | ICD-10-CM | POA: Diagnosis not present

## 2022-01-17 DIAGNOSIS — L570 Actinic keratosis: Secondary | ICD-10-CM | POA: Diagnosis not present

## 2022-01-17 DIAGNOSIS — S80862A Insect bite (nonvenomous), left lower leg, initial encounter: Secondary | ICD-10-CM | POA: Diagnosis not present

## 2022-01-17 DIAGNOSIS — L814 Other melanin hyperpigmentation: Secondary | ICD-10-CM | POA: Diagnosis not present

## 2022-04-02 ENCOUNTER — Ambulatory Visit: Payer: HMO | Admitting: Podiatry

## 2022-11-04 IMAGING — CR DG TOE GREAT 2+V*R*
3 series · 3 of 3 positions shown · non-contrast
Comparison: None.

CLINICAL DATA: Nonhealing diabetic foot ulcer at the great toe

EXAM:
RIGHT GREAT TOE

[x toes ap right]
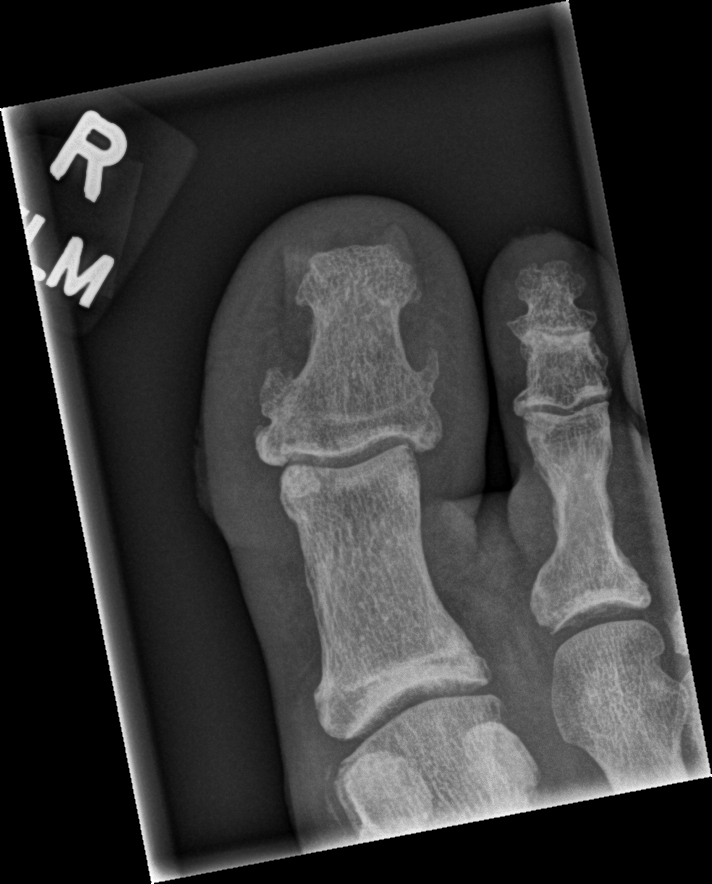

[x toes obl right]
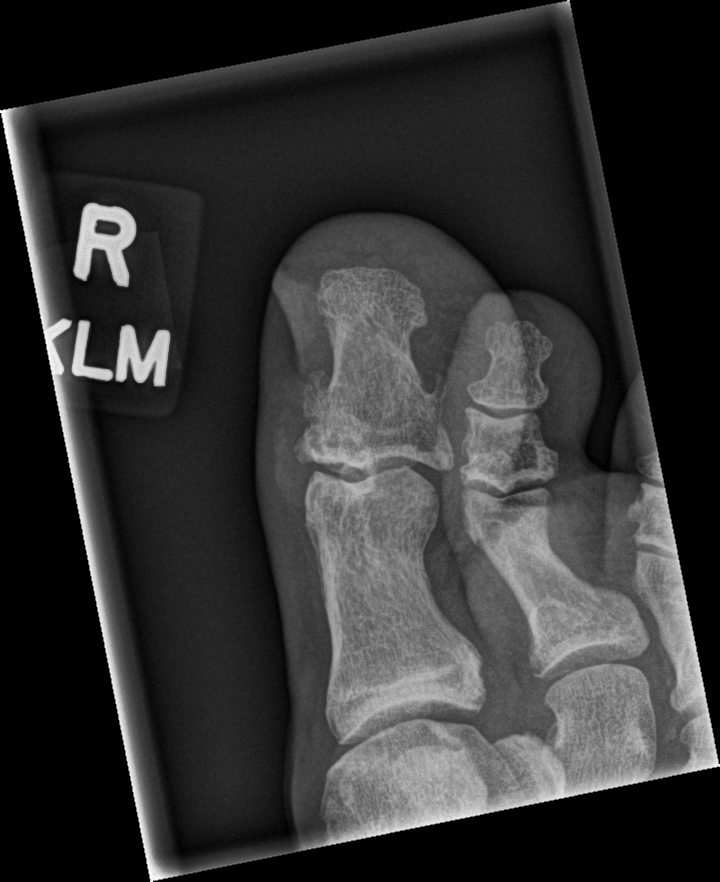

[x toes lat right]
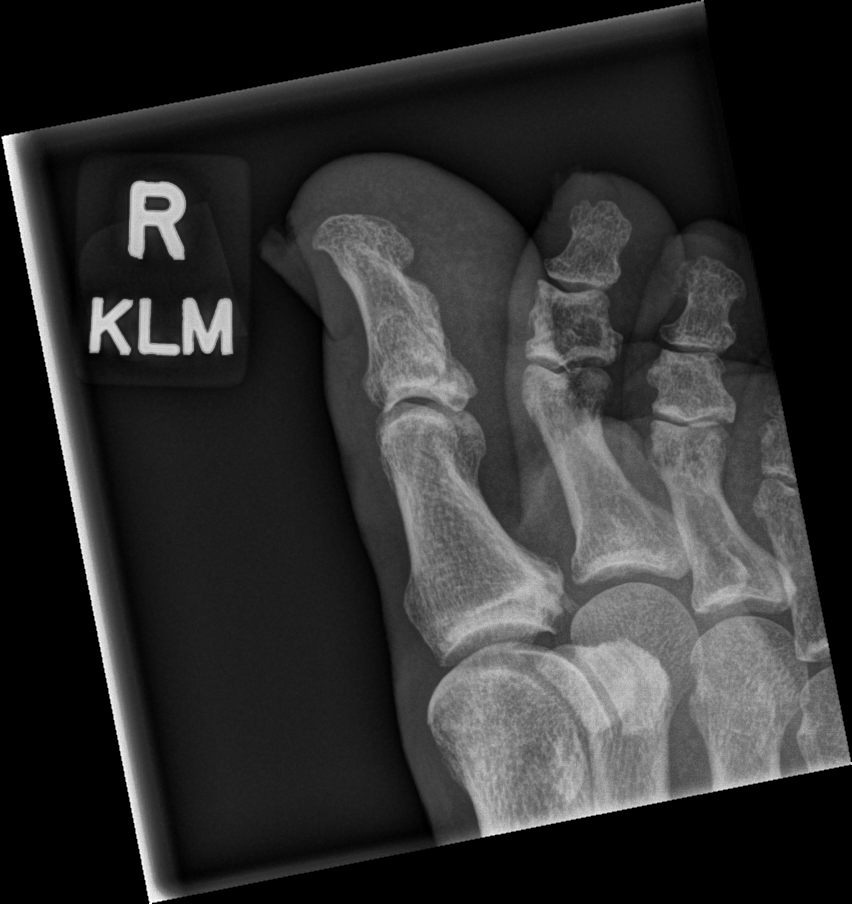

[3 of 3 positions shown; findings below may reference images not displayed]

FINDINGS: No osteomyelitis, gas, or opaque foreign body. Mild spurring of the
great toe.
IMPRESSION: No evidence of osteomyelitis. No opaque foreign body or soft tissue
gas.

## 2023-05-05 DIAGNOSIS — Z87891 Personal history of nicotine dependence: Secondary | ICD-10-CM | POA: Diagnosis not present

## 2023-05-05 DIAGNOSIS — I10 Essential (primary) hypertension: Secondary | ICD-10-CM | POA: Diagnosis not present

## 2023-05-05 DIAGNOSIS — Z7982 Long term (current) use of aspirin: Secondary | ICD-10-CM | POA: Diagnosis not present

## 2023-05-05 DIAGNOSIS — Z9861 Coronary angioplasty status: Secondary | ICD-10-CM | POA: Diagnosis not present

## 2023-05-05 DIAGNOSIS — L97919 Non-pressure chronic ulcer of unspecified part of right lower leg with unspecified severity: Secondary | ICD-10-CM | POA: Diagnosis not present

## 2023-05-05 DIAGNOSIS — E11621 Type 2 diabetes mellitus with foot ulcer: Secondary | ICD-10-CM | POA: Diagnosis not present

## 2023-05-05 DIAGNOSIS — E039 Hypothyroidism, unspecified: Secondary | ICD-10-CM | POA: Diagnosis not present

## 2023-05-05 DIAGNOSIS — M1990 Unspecified osteoarthritis, unspecified site: Secondary | ICD-10-CM | POA: Diagnosis not present

## 2023-05-05 DIAGNOSIS — I2510 Atherosclerotic heart disease of native coronary artery without angina pectoris: Secondary | ICD-10-CM | POA: Diagnosis not present

## 2023-05-05 DIAGNOSIS — E1142 Type 2 diabetes mellitus with diabetic polyneuropathy: Secondary | ICD-10-CM | POA: Diagnosis not present

## 2023-05-05 DIAGNOSIS — E669 Obesity, unspecified: Secondary | ICD-10-CM | POA: Diagnosis not present

## 2023-05-20 DIAGNOSIS — L97519 Non-pressure chronic ulcer of other part of right foot with unspecified severity: Secondary | ICD-10-CM | POA: Diagnosis not present

## 2023-05-20 DIAGNOSIS — I10 Essential (primary) hypertension: Secondary | ICD-10-CM | POA: Diagnosis not present

## 2023-05-20 DIAGNOSIS — E11621 Type 2 diabetes mellitus with foot ulcer: Secondary | ICD-10-CM | POA: Diagnosis not present

## 2023-05-20 DIAGNOSIS — T466X5A Adverse effect of antihyperlipidemic and antiarteriosclerotic drugs, initial encounter: Secondary | ICD-10-CM | POA: Diagnosis not present

## 2023-05-20 DIAGNOSIS — E1165 Type 2 diabetes mellitus with hyperglycemia: Secondary | ICD-10-CM | POA: Diagnosis not present

## 2023-05-20 DIAGNOSIS — E669 Obesity, unspecified: Secondary | ICD-10-CM | POA: Diagnosis not present

## 2023-05-20 DIAGNOSIS — I2510 Atherosclerotic heart disease of native coronary artery without angina pectoris: Secondary | ICD-10-CM | POA: Diagnosis not present

## 2023-05-20 DIAGNOSIS — Z7901 Long term (current) use of anticoagulants: Secondary | ICD-10-CM | POA: Diagnosis not present

## 2023-05-20 DIAGNOSIS — E7800 Pure hypercholesterolemia, unspecified: Secondary | ICD-10-CM | POA: Diagnosis not present

## 2023-05-20 DIAGNOSIS — E113291 Type 2 diabetes mellitus with mild nonproliferative diabetic retinopathy without macular edema, right eye: Secondary | ICD-10-CM | POA: Diagnosis not present

## 2023-05-20 DIAGNOSIS — Z23 Encounter for immunization: Secondary | ICD-10-CM | POA: Diagnosis not present

## 2023-05-29 ENCOUNTER — Encounter: Payer: Self-pay | Admitting: Podiatry

## 2023-05-29 ENCOUNTER — Ambulatory Visit: Payer: PPO | Admitting: Podiatry

## 2023-05-29 DIAGNOSIS — E11621 Type 2 diabetes mellitus with foot ulcer: Secondary | ICD-10-CM | POA: Diagnosis not present

## 2023-05-29 DIAGNOSIS — E1142 Type 2 diabetes mellitus with diabetic polyneuropathy: Secondary | ICD-10-CM | POA: Diagnosis not present

## 2023-05-29 DIAGNOSIS — E119 Type 2 diabetes mellitus without complications: Secondary | ICD-10-CM | POA: Diagnosis not present

## 2023-05-29 DIAGNOSIS — L97519 Non-pressure chronic ulcer of other part of right foot with unspecified severity: Secondary | ICD-10-CM

## 2023-05-29 MED ORDER — MUPIROCIN 2 % EX OINT: 1.0000 | TOPICAL_OINTMENT | Freq: Two times a day (BID) | CUTANEOUS | 2 refills | Status: AC

## 2023-05-29 NOTE — Patient Instructions (Addendum)
VISIT SUMMARY:  During your visit, we discussed your recurrent diabetic foot ulcer on your right big toe, a pre-ulcerative callus on your left big toe, and your poorly controlled diabetes. We also talked about the importance of general health maintenance.  YOUR PLAN:  -DIABETIC FOOT ULCER: You have a deep wound on your right big toe. We cleaned the wound and the hard skin around it. We prescribed an ointment (Mupirocin) for you to apply daily and advised you to dress the wound with gauze and a compression wrap or a large Band-Aid, whichever you prefer. We also provided a special shoe with a modified insert to help take pressure off the wound.  -PREULCERATIVE CALLUS: You have a hard skin area on your left big toe that could turn into a wound. We cleaned this area and advised you to apply Neosporin and a Band-Aid daily.  -POORLY CONTROLLED DIABETES: Your blood sugar levels have been higher than usual. Good blood sugar control is important for wound healing.  INSTRUCTIONS:  Please apply the prescribed ointment and dress your wounds daily as advised. Wear the provided special shoe to help your right toe wound heal. Continue to avoid tobacco and engage in regular physical activity as much as you can. We will see you again in 3 weeks to check on your progress.

## 2023-06-01 ENCOUNTER — Encounter: Payer: Self-pay | Admitting: Podiatry
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
## 2023-07-01 ENCOUNTER — Encounter: Payer: Self-pay | Admitting: Podiatry

## 2023-07-01 ENCOUNTER — Ambulatory Visit: Payer: PPO | Admitting: Podiatry

## 2023-07-01 DIAGNOSIS — B351 Tinea unguium: Secondary | ICD-10-CM | POA: Diagnosis not present

## 2023-07-01 DIAGNOSIS — B353 Tinea pedis: Secondary | ICD-10-CM | POA: Diagnosis not present

## 2023-07-01 DIAGNOSIS — M79675 Pain in left toe(s): Secondary | ICD-10-CM | POA: Diagnosis not present

## 2023-07-01 DIAGNOSIS — E11621 Type 2 diabetes mellitus with foot ulcer: Secondary | ICD-10-CM | POA: Diagnosis not present

## 2023-07-01 DIAGNOSIS — L97519 Non-pressure chronic ulcer of other part of right foot with unspecified severity: Secondary | ICD-10-CM | POA: Diagnosis not present

## 2023-07-01 DIAGNOSIS — M79674 Pain in right toe(s): Secondary | ICD-10-CM

## 2023-07-01 MED ORDER — KETOCONAZOLE 2 % EX CREA: 1.0000 | TOPICAL_CREAM | Freq: Two times a day (BID) | CUTANEOUS | 2 refills | Status: AC

## 2023-07-03 NOTE — Progress Notes (Signed)
  Subjective:  Patient ID: [name], male    DOB: 06/25/1951,  MRN: [id number]  Chief Complaint  Patient presents with   Diabetic Ulcer    Follow up on ulcer right great toe patient states is doing well /nail trim      Discussed the use of AI scribe software for clinical note transcription with the patient, who gave verbal consent to proceed.  History of Present Illness   He returns for follow-up he has been using antibiotic ointment as directed notes improvement notes there is dry itching skin all over the feet as well         Objective:    Physical Exam   EXTREMITIES: Palpable pulses, foot warm and well perfused, normal capillary fill time. SKIN: Pre ulcerative callus on left medial plantar hallux has healed. Full thickness ulceration on plantar medial right hallux with exposed granular subcutaneous tissue, some surrounding hyperkeratosis, no signs of infection, purulence, foul odor, exposed bone, tendon, or joint.  Postdebridement it measures 0.5 x 0.4 x 0.3 cm.  Thickened elongated dystrophic nails x 10          Results   Procedure: Debridement of diabetic ulcer Description: Debrided necrotic tissue on both sides of the R hallux sharply in excisional manner to subcutaneous layer. Slight bleeding observed where the tissue was debrided deeper. Applied Neosporin and a Band-Aid. Measured and photographed the full-thickness ulceration on the plantar medial right hallux, which measured 4mm by 5mm by 3mm deep. Applied iodine-based ointment to the wound.  LABS HbA1c: 10.5% (04/2023)      Assessment:   Encounter Diagnoses  Name Primary?   Diabetic ulcer of right great toe (HCC) Yes   Pain due to onychomycosis of toenails of both feet    Tinea pedis of both feet       Plan:  Patient was evaluated and treated and all questions answered.  Assessment and Plan    Diabetic Foot Ulcer He has a full thickness ulceration on the plantar medial right hallux with exposed  granular subcutaneous tissue and large surrounding hyperkeratosis. There are no signs of infection, purulence, foul odor, exposed bone tendon, or joint. We will debride the wound as noted above and surrounding callus, prescribe Mupirocin ointment for daily application, and advise daily wound dressing with gauze and compression wrap or a large Band-Aid, according to his preference.  Continue using surgical shoe and mupirocin.  Return in 3 weeks for wound care   Discussed the etiology and treatment options for tinea pedis.  Discussed topical and oral treatment.  Recommended topical treatment with 2% ketoconazole cream.  This was sent to the patient's pharmacy.  Also discussed appropriate foot hygiene, use of antifungal spray such as Tinactin in shoes, as well as cleaning her foot surfaces such as showers and bathroom floors with bleach.     Discussed the etiology and treatment options for the condition in detail with the patient.  Recommended debridement of the nails today. Sharp and mechanical debridement performed of all painful and mycotic nails today. Nails debrided in length and thickness using a nail nipper to level of comfort. Discussed treatment options including appropriate shoe gear. Follow up as needed for painful nails.    Return in about 3 weeks (around 07/22/2023) for wound care.

## 2023-07-22 ENCOUNTER — Ambulatory Visit: Payer: PPO | Admitting: Podiatry

## 2023-07-22 ENCOUNTER — Encounter: Payer: Self-pay | Admitting: Podiatry

## 2023-07-22 DIAGNOSIS — L97519 Non-pressure chronic ulcer of other part of right foot with unspecified severity: Secondary | ICD-10-CM

## 2023-07-22 DIAGNOSIS — E11621 Type 2 diabetes mellitus with foot ulcer: Secondary | ICD-10-CM | POA: Diagnosis not present

## 2023-07-22 NOTE — Progress Notes (Signed)
  Subjective:  Patient ID: [name], male    DOB: 04/20/1951,  MRN: [id number]  Chief Complaint  Patient presents with   Routine Post Op    PATIENT STATES HIS DIABETIC ULCER , PATIENT STATES HE BELIEVE HIS R HALLUX IS GETTING BETTER SINCE LAST VISIT       Discussed the use of AI scribe software for clinical note transcription with the patient, who gave verbal consent to proceed.  History of Present Illness   He returns for follow-up he has not been able to use the ointment as much because his family ember that helps him dress it has been out of town      Objective:    Physical Exam   EXTREMITIES: Palpable pulses, foot warm and well perfused, normal capillary fill time. SKIN: Pre ulcerative callus on left medial plantar hallux has healed. Full thickness ulceration on plantar medial right hallux with exposed granular subcutaneous tissue, some surrounding hyperkeratosis, no signs of infection, purulence, foul odor, exposed bone, tendon, or joint.  Postdebridement it measures 0.3 x 0.3 x 0.3 cm.  Thickened elongated dystrophic nails x 10          Results   Procedure: Debridement of diabetic ulcer Description: Debrided necrotic tissue on both sides of the R hallux sharply in excisional manner to subcutaneous layer. Slight bleeding observed where the tissue was debrided deeper. Applied Neosporin and a Band-Aid. Measured and photographed the full-thickness ulceration on the plantar medial right hallux, which measured 3mm by 3mm by 3mm deep. Applied iodine-based ointment to the wound.  LABS HbA1c: 10.5% (04/2023)      Assessment:   Encounter Diagnosis  Name Primary?   Diabetic ulcer of right great toe (HCC) Yes      Plan:  Patient was evaluated and treated and all questions answered.  Assessment and Plan    Diabetic Foot Ulcer He has a full thickness ulceration on the plantar medial right hallux with exposed granular subcutaneous tissue and large surrounding  hyperkeratosis. There are no signs of infection, purulence, foul odor, exposed bone tendon, or joint.  Wound was debrided as noted in procedure note above.  Continue use of Band-Aid and mupirocin.  Dressed daily.  Return in 1 month to follow-up for ongoing wound care hopefully should be nearly healed at next point      Return in about 5 weeks (around 08/26/2023) for wound care.

## 2023-08-05 DIAGNOSIS — H11153 Pinguecula, bilateral: Secondary | ICD-10-CM | POA: Diagnosis not present

## 2023-08-05 DIAGNOSIS — H179 Unspecified corneal scar and opacity: Secondary | ICD-10-CM | POA: Diagnosis not present

## 2023-08-05 DIAGNOSIS — H524 Presbyopia: Secondary | ICD-10-CM | POA: Diagnosis not present

## 2023-08-05 DIAGNOSIS — E119 Type 2 diabetes mellitus without complications: Secondary | ICD-10-CM | POA: Diagnosis not present

## 2023-08-05 DIAGNOSIS — H25813 Combined forms of age-related cataract, bilateral: Secondary | ICD-10-CM | POA: Diagnosis not present

## 2023-08-26 ENCOUNTER — Encounter: Payer: Self-pay | Admitting: Podiatry

## 2023-08-26 ENCOUNTER — Ambulatory Visit (INDEPENDENT_AMBULATORY_CARE_PROVIDER_SITE_OTHER): Payer: PPO | Admitting: Podiatry

## 2023-08-26 VITALS — Ht 72.0 in

## 2023-08-26 DIAGNOSIS — E11621 Type 2 diabetes mellitus with foot ulcer: Secondary | ICD-10-CM

## 2023-08-26 DIAGNOSIS — M79674 Pain in right toe(s): Secondary | ICD-10-CM | POA: Diagnosis not present

## 2023-08-26 DIAGNOSIS — L97519 Non-pressure chronic ulcer of other part of right foot with unspecified severity: Secondary | ICD-10-CM | POA: Diagnosis not present

## 2023-08-26 DIAGNOSIS — M79675 Pain in left toe(s): Secondary | ICD-10-CM

## 2023-08-26 DIAGNOSIS — B351 Tinea unguium: Secondary | ICD-10-CM

## 2023-08-26 NOTE — Progress Notes (Signed)
  Subjective:  Patient ID: [name], male    DOB: 1950-11-11,  MRN: [id number]  Chief Complaint  Patient presents with   Wound Check    He reports he is here for a follow up on the great toe right foot and would like a nail trim .      Discussed the use of AI scribe software for clinical note transcription with the patient, who gave verbal consent to proceed.  History of Present Illness   He returns for follow-up has not noticed any drainage there was a blood blister 1 point that is healed     Objective:    Physical Exam   EXTREMITIES: Palpable pulses, foot warm and well perfused, normal capillary fill time. SKIN: Pre ulcerative callus on left medial plantar hallux has healed.  Partial-thickness ulceration plantar medial right hallux.  Improved.  Thickened elongated mycotic dystrophic toenails x 10          LABS HbA1c: 10.5% (04/2023)      Assessment:   Encounter Diagnoses  Name Primary?   Diabetic ulcer of right great toe (HCC) Yes   Pain due to onychomycosis of toenails of both feet       Plan:  Patient was evaluated and treated and all questions answered.  Assessment and Plan    Diabetic Foot Ulcer Overlying skin was debrided to remove the callus and is nearly fully healed.  Apply antibiotic ointment daily with a bandage and then leave open to air once it is healed.  Discussed the etiology and treatment options for the condition in detail with the patient. Recommended debridement of the nails today. Sharp and mechanical debridement performed of all painful and mycotic nails today. Nails debrided in length and thickness using a nail nipper to level of comfort. Discussed treatment options including appropriate shoe gear. Follow up as needed for painful nails.       Return in about 4 weeks (around 09/23/2023) for wound care.

## 2023-09-23 ENCOUNTER — Ambulatory Visit (INDEPENDENT_AMBULATORY_CARE_PROVIDER_SITE_OTHER): Payer: PPO | Admitting: Podiatry

## 2023-09-23 DIAGNOSIS — L97519 Non-pressure chronic ulcer of other part of right foot with unspecified severity: Secondary | ICD-10-CM

## 2023-09-23 DIAGNOSIS — E11621 Type 2 diabetes mellitus with foot ulcer: Secondary | ICD-10-CM | POA: Diagnosis not present

## 2023-09-23 DIAGNOSIS — B353 Tinea pedis: Secondary | ICD-10-CM | POA: Diagnosis not present

## 2023-09-23 NOTE — Progress Notes (Signed)
  Subjective:  Patient ID: [name], male    DOB: [birth year],  MRN: [id number]  Chief Complaint  Patient presents with   Wound Check    Diabetic ulcer of right toe, wound is healed looks dry, callous on left great toe       History of Present Illness   He returns for follow-up has not noticed any drainage at all, he tried using ketoconazole  cream but he has such difficulty reaching his feet possible to use it consistently     Objective:    Physical Exam   EXTREMITIES: Palpable pulses, foot warm and well perfused, normal capillary fill time. SKIN: Pre ulcerative callus on left medial plantar hallux has healed.  Healed ulceration plantar medial right hallux.  Improved.  Thickened elongated mycotic dystrophic toenails x 10.  Tinea pedis noted          LABS HbA1c: 10.5% (04/2023)      Assessment:   Encounter Diagnoses  Name Primary?   Tinea pedis of both feet Yes   Diabetic ulcer of right great toe [facility])       Plan:  Patient was evaluated and treated and all questions answered.  Assessment and Plan    Diabetic Foot Ulcer Overlying skin was debrided to remove the callus and is fully healed.  May leave open to air and use moisturizing lotion  Ketoconazole  use was not possible for him due to difficulty reaching his feet I discussed using a spray antifungal such as Tinactin.  He will use this on his feet and issues.  Return as needed if this does not improve or worsens.       Return if symptoms worsen or fail to improve.

## 2024-01-20 DIAGNOSIS — M19071 Primary osteoarthritis, right ankle and foot: Secondary | ICD-10-CM | POA: Diagnosis not present

## 2024-01-20 DIAGNOSIS — B351 Tinea unguium: Secondary | ICD-10-CM | POA: Diagnosis not present

## 2024-01-20 DIAGNOSIS — B353 Tinea pedis: Secondary | ICD-10-CM | POA: Diagnosis not present

## 2024-01-20 DIAGNOSIS — L84 Corns and callosities: Secondary | ICD-10-CM | POA: Diagnosis not present

## 2024-01-20 DIAGNOSIS — I739 Peripheral vascular disease, unspecified: Secondary | ICD-10-CM | POA: Diagnosis not present

## 2024-01-20 DIAGNOSIS — M19072 Primary osteoarthritis, left ankle and foot: Secondary | ICD-10-CM | POA: Diagnosis not present

## 2024-01-20 DIAGNOSIS — E1151 Type 2 diabetes mellitus with diabetic peripheral angiopathy without gangrene: Secondary | ICD-10-CM | POA: Diagnosis not present

## 2024-01-20 DIAGNOSIS — R234 Changes in skin texture: Secondary | ICD-10-CM | POA: Diagnosis not present

## 2024-01-28 DIAGNOSIS — E11610 Type 2 diabetes mellitus with diabetic neuropathic arthropathy: Secondary | ICD-10-CM | POA: Diagnosis not present

## 2024-01-28 DIAGNOSIS — G5603 Carpal tunnel syndrome, bilateral upper limbs: Secondary | ICD-10-CM | POA: Diagnosis not present

## 2024-01-28 DIAGNOSIS — I252 Old myocardial infarction: Secondary | ICD-10-CM | POA: Diagnosis not present

## 2024-01-28 DIAGNOSIS — M1990 Unspecified osteoarthritis, unspecified site: Secondary | ICD-10-CM | POA: Diagnosis not present

## 2024-01-28 DIAGNOSIS — E119 Type 2 diabetes mellitus without complications: Secondary | ICD-10-CM | POA: Diagnosis not present

## 2024-01-28 DIAGNOSIS — M14671 Charcot's joint, right ankle and foot: Secondary | ICD-10-CM | POA: Diagnosis not present

## 2024-01-28 DIAGNOSIS — L853 Xerosis cutis: Secondary | ICD-10-CM | POA: Diagnosis not present

## 2024-01-28 DIAGNOSIS — F22 Delusional disorders: Secondary | ICD-10-CM | POA: Diagnosis not present

## 2024-02-03 DIAGNOSIS — B351 Tinea unguium: Secondary | ICD-10-CM | POA: Diagnosis not present

## 2024-02-11 DIAGNOSIS — Z139 Encounter for screening, unspecified: Secondary | ICD-10-CM | POA: Diagnosis not present

## 2024-02-11 DIAGNOSIS — E1165 Type 2 diabetes mellitus with hyperglycemia: Secondary | ICD-10-CM | POA: Diagnosis not present

## 2024-02-11 DIAGNOSIS — H9313 Tinnitus, bilateral: Secondary | ICD-10-CM | POA: Diagnosis not present

## 2024-02-11 DIAGNOSIS — I8390 Asymptomatic varicose veins of unspecified lower extremity: Secondary | ICD-10-CM | POA: Diagnosis not present

## 2024-02-11 DIAGNOSIS — R7401 Elevation of levels of liver transaminase levels: Secondary | ICD-10-CM | POA: Diagnosis not present

## 2024-02-11 DIAGNOSIS — K921 Melena: Secondary | ICD-10-CM | POA: Diagnosis not present

## 2024-02-11 DIAGNOSIS — E786 Lipoprotein deficiency: Secondary | ICD-10-CM | POA: Diagnosis not present

## 2024-02-13 DIAGNOSIS — E1165 Type 2 diabetes mellitus with hyperglycemia: Secondary | ICD-10-CM | POA: Diagnosis not present

## 2024-02-13 DIAGNOSIS — F22 Delusional disorders: Secondary | ICD-10-CM | POA: Diagnosis not present

## 2024-02-13 DIAGNOSIS — M1990 Unspecified osteoarthritis, unspecified site: Secondary | ICD-10-CM | POA: Diagnosis not present

## 2024-03-02 DIAGNOSIS — M19072 Primary osteoarthritis, left ankle and foot: Secondary | ICD-10-CM | POA: Diagnosis not present

## 2024-03-02 DIAGNOSIS — R234 Changes in skin texture: Secondary | ICD-10-CM | POA: Diagnosis not present

## 2024-03-02 DIAGNOSIS — I739 Peripheral vascular disease, unspecified: Secondary | ICD-10-CM | POA: Diagnosis not present

## 2024-03-02 DIAGNOSIS — E1151 Type 2 diabetes mellitus with diabetic peripheral angiopathy without gangrene: Secondary | ICD-10-CM | POA: Diagnosis not present

## 2024-03-02 DIAGNOSIS — L84 Corns and callosities: Secondary | ICD-10-CM | POA: Diagnosis not present

## 2024-03-02 DIAGNOSIS — B351 Tinea unguium: Secondary | ICD-10-CM | POA: Diagnosis not present

## 2024-03-02 DIAGNOSIS — B353 Tinea pedis: Secondary | ICD-10-CM | POA: Diagnosis not present

## 2024-03-02 DIAGNOSIS — M19071 Primary osteoarthritis, right ankle and foot: Secondary | ICD-10-CM | POA: Diagnosis not present

## 2024-03-02 DIAGNOSIS — M792 Neuralgia and neuritis, unspecified: Secondary | ICD-10-CM | POA: Diagnosis not present

## 2024-03-04 DIAGNOSIS — M1990 Unspecified osteoarthritis, unspecified site: Secondary | ICD-10-CM | POA: Diagnosis not present

## 2024-03-04 DIAGNOSIS — I252 Old myocardial infarction: Secondary | ICD-10-CM | POA: Diagnosis not present

## 2024-03-04 DIAGNOSIS — E1165 Type 2 diabetes mellitus with hyperglycemia: Secondary | ICD-10-CM | POA: Diagnosis not present

## 2024-03-04 DIAGNOSIS — F22 Delusional disorders: Secondary | ICD-10-CM | POA: Diagnosis not present

## 2024-05-12 DIAGNOSIS — I10 Essential (primary) hypertension: Secondary | ICD-10-CM | POA: Diagnosis not present

## 2024-05-12 DIAGNOSIS — I8390 Asymptomatic varicose veins of unspecified lower extremity: Secondary | ICD-10-CM | POA: Diagnosis not present

## 2024-05-12 DIAGNOSIS — E119 Type 2 diabetes mellitus without complications: Secondary | ICD-10-CM | POA: Diagnosis not present

## 2024-05-12 DIAGNOSIS — I252 Old myocardial infarction: Secondary | ICD-10-CM | POA: Diagnosis not present

## 2024-05-12 DIAGNOSIS — M1711 Unilateral primary osteoarthritis, right knee: Secondary | ICD-10-CM | POA: Diagnosis not present

## 2024-05-12 DIAGNOSIS — R748 Abnormal levels of other serum enzymes: Secondary | ICD-10-CM | POA: Diagnosis not present

## 2024-05-12 DIAGNOSIS — E871 Hypo-osmolality and hyponatremia: Secondary | ICD-10-CM | POA: Diagnosis not present

## 2024-05-18 DIAGNOSIS — F22 Delusional disorders: Secondary | ICD-10-CM | POA: Diagnosis not present

## 2024-05-18 DIAGNOSIS — M1990 Unspecified osteoarthritis, unspecified site: Secondary | ICD-10-CM | POA: Diagnosis not present

## 2024-05-18 DIAGNOSIS — I10 Essential (primary) hypertension: Secondary | ICD-10-CM | POA: Diagnosis not present

## 2024-05-18 DIAGNOSIS — I252 Old myocardial infarction: Secondary | ICD-10-CM | POA: Diagnosis not present

## 2024-05-18 DIAGNOSIS — E1165 Type 2 diabetes mellitus with hyperglycemia: Secondary | ICD-10-CM | POA: Diagnosis not present

## 2024-06-16 DIAGNOSIS — I252 Old myocardial infarction: Secondary | ICD-10-CM | POA: Diagnosis not present

## 2024-06-16 DIAGNOSIS — I10 Essential (primary) hypertension: Secondary | ICD-10-CM | POA: Diagnosis not present

## 2024-06-16 DIAGNOSIS — E1165 Type 2 diabetes mellitus with hyperglycemia: Secondary | ICD-10-CM | POA: Diagnosis not present

## 2024-06-16 DIAGNOSIS — F22 Delusional disorders: Secondary | ICD-10-CM | POA: Diagnosis not present

## 2024-06-16 DIAGNOSIS — M1990 Unspecified osteoarthritis, unspecified site: Secondary | ICD-10-CM | POA: Diagnosis not present

## 2024-06-23 DIAGNOSIS — Z23 Encounter for immunization: Secondary | ICD-10-CM | POA: Diagnosis not present

## 2024-06-23 DIAGNOSIS — I252 Old myocardial infarction: Secondary | ICD-10-CM | POA: Diagnosis not present

## 2024-06-23 DIAGNOSIS — E119 Type 2 diabetes mellitus without complications: Secondary | ICD-10-CM | POA: Diagnosis not present

## 2024-06-23 DIAGNOSIS — Z1331 Encounter for screening for depression: Secondary | ICD-10-CM | POA: Diagnosis not present

## 2024-06-23 DIAGNOSIS — Z0001 Encounter for general adult medical examination with abnormal findings: Secondary | ICD-10-CM | POA: Diagnosis not present

## 2024-06-23 DIAGNOSIS — L309 Dermatitis, unspecified: Secondary | ICD-10-CM | POA: Diagnosis not present

## 2024-06-23 DIAGNOSIS — Z7189 Other specified counseling: Secondary | ICD-10-CM | POA: Diagnosis not present

## 2024-07-16 DIAGNOSIS — F22 Delusional disorders: Secondary | ICD-10-CM | POA: Diagnosis not present

## 2024-07-16 DIAGNOSIS — M1711 Unilateral primary osteoarthritis, right knee: Secondary | ICD-10-CM | POA: Diagnosis not present

## 2024-08-20 NOTE — Progress Notes (Signed)
 [name]                                          MRN: [id number]   08/20/2024   The [facility] Quality Team Specialist reviewed this patient medical record for the purposes of chart review for care gap closure. The following were reviewed: chart review for care gap closure-glycemic status assessment.    [facility] Quality Team

## 2024-08-24 NOTE — Progress Notes (Signed)
 [name]                                          MRN: [id number]   08/24/2024   The [facility] Quality Team Specialist reviewed this patient medical record for the purposes of chart review for care gap closure. The following were reviewed: chart review for care gap closure-controlling blood pressure.    [facility] Quality Team

## 2024-08-25 NOTE — Progress Notes (Signed)
 [name]                                          MRN: [id number]   08/25/2024   The [facility] Quality Team Specialist reviewed this patient medical record for the purposes of chart review for care gap closure. The following were reviewed: chart review for care gap closure-glycemic status assessment.    [facility] Quality Team
